# Patient Record
Sex: Female | Born: 1965 | Race: White | Hispanic: No | Smoking: Current every day smoker
Health system: Southern US, Community
[De-identification: ages and names within clinical notes are randomized; demographics above are authoritative.]

## PROBLEM LIST (undated history)

## (undated) DIAGNOSIS — Z87442 Personal history of urinary calculi: Secondary | ICD-10-CM

## (undated) DIAGNOSIS — R413 Other amnesia: Secondary | ICD-10-CM

## (undated) DIAGNOSIS — S069X9A Unspecified intracranial injury with loss of consciousness of unspecified duration, initial encounter: Secondary | ICD-10-CM

## (undated) DIAGNOSIS — G039 Meningitis, unspecified: Secondary | ICD-10-CM

## (undated) DIAGNOSIS — E876 Hypokalemia: Secondary | ICD-10-CM

## (undated) DIAGNOSIS — T4145XA Adverse effect of unspecified anesthetic, initial encounter: Secondary | ICD-10-CM

## (undated) DIAGNOSIS — R2689 Other abnormalities of gait and mobility: Secondary | ICD-10-CM

## (undated) DIAGNOSIS — N12 Tubulo-interstitial nephritis, not specified as acute or chronic: Secondary | ICD-10-CM

## (undated) DIAGNOSIS — N200 Calculus of kidney: Secondary | ICD-10-CM

## (undated) DIAGNOSIS — S2239XA Fracture of one rib, unspecified side, initial encounter for closed fracture: Secondary | ICD-10-CM

## (undated) DIAGNOSIS — N132 Hydronephrosis with renal and ureteral calculous obstruction: Secondary | ICD-10-CM

## (undated) DIAGNOSIS — J189 Pneumonia, unspecified organism: Secondary | ICD-10-CM

## (undated) DIAGNOSIS — T8859XA Other complications of anesthesia, initial encounter: Secondary | ICD-10-CM

## (undated) DIAGNOSIS — N3020 Other chronic cystitis without hematuria: Secondary | ICD-10-CM

## (undated) DIAGNOSIS — G8929 Other chronic pain: Secondary | ICD-10-CM

## (undated) DIAGNOSIS — R310 Gross hematuria: Secondary | ICD-10-CM

## (undated) DIAGNOSIS — N63 Unspecified lump in unspecified breast: Secondary | ICD-10-CM

## (undated) DIAGNOSIS — N23 Unspecified renal colic: Secondary | ICD-10-CM

## (undated) DIAGNOSIS — N201 Calculus of ureter: Secondary | ICD-10-CM

## (undated) DIAGNOSIS — N289 Disorder of kidney and ureter, unspecified: Secondary | ICD-10-CM

## (undated) HISTORY — DX: Unspecified renal colic: N23

## (undated) HISTORY — DX: Hydronephrosis with renal and ureteral calculous obstruction: N13.2

## (undated) HISTORY — DX: Unspecified lump in unspecified breast: N63.0

## (undated) HISTORY — PX: ABDOMINAL HYSTERECTOMY: SHX81

## (undated) HISTORY — PX: FACIAL FRACTURE SURGERY: SHX1570

## (undated) HISTORY — PX: CLAVICLE SURGERY: SHX598

## (undated) HISTORY — DX: Calculus of ureter: N20.1

## (undated) HISTORY — DX: Hypokalemia: E87.6

## (undated) HISTORY — DX: Other chronic cystitis without hematuria: N30.20

## (undated) HISTORY — PX: FEMUR FRACTURE SURGERY: SHX633

## (undated) HISTORY — DX: Tubulo-interstitial nephritis, not specified as acute or chronic: N12

## (undated) HISTORY — DX: Other chronic pain: G89.29

## (undated) HISTORY — DX: Gross hematuria: R31.0

---

## 1999-07-12 HISTORY — PX: FRACTURE SURGERY: SHX138

## 2010-03-16 ENCOUNTER — Emergency Department: Payer: Self-pay | Admitting: Emergency Medicine

## 2011-07-12 DIAGNOSIS — G039 Meningitis, unspecified: Secondary | ICD-10-CM

## 2011-07-12 HISTORY — DX: Meningitis, unspecified: G03.9

## 2012-01-11 ENCOUNTER — Emergency Department: Payer: Self-pay | Admitting: *Deleted

## 2012-01-11 LAB — URINALYSIS, COMPLETE
Glucose,UR: NEGATIVE mg/dL (ref 0–75)
Ketone: NEGATIVE
Nitrite: NEGATIVE
Ph: 5 (ref 4.5–8.0)
Protein: NEGATIVE
Specific Gravity: 1.005 (ref 1.003–1.030)

## 2012-01-11 LAB — CBC
HCT: 41.1 % (ref 35.0–47.0)
HGB: 14.1 g/dL (ref 12.0–16.0)
MCHC: 34.2 g/dL (ref 32.0–36.0)
Platelet: 245 10*3/uL (ref 150–440)
RBC: 4.23 10*6/uL (ref 3.80–5.20)
WBC: 6.1 10*3/uL (ref 3.6–11.0)

## 2012-01-11 LAB — COMPREHENSIVE METABOLIC PANEL
Albumin: 4.4 g/dL (ref 3.4–5.0)
Anion Gap: 9 (ref 7–16)
BUN: 10 mg/dL (ref 7–18)
Calcium, Total: 9.4 mg/dL (ref 8.5–10.1)
Chloride: 105 mmol/L (ref 98–107)
EGFR (African American): >60
Glucose: 143 mg/dL — ABNORMAL HIGH (ref 65–99)
Osmolality: 281 (ref 275–301)
Potassium: 3.7 mmol/L (ref 3.5–5.1)
SGOT(AST): 26 U/L (ref 15–37)
Total Protein: 7.8 g/dL (ref 6.4–8.2)

## 2012-01-11 LAB — LIPASE, BLOOD: Lipase: 115 U/L (ref 73–393)

## 2012-02-11 ENCOUNTER — Emergency Department: Payer: Self-pay | Admitting: Emergency Medicine

## 2012-02-11 LAB — BASIC METABOLIC PANEL
Anion Gap: 11 (ref 7–16)
Chloride: 105 mmol/L (ref 98–107)
Co2: 28 mmol/L (ref 21–32)
EGFR (African American): >60
EGFR (Non-African Amer.): >60
Glucose: 91 mg/dL (ref 65–99)
Sodium: 144 mmol/L (ref 136–145)

## 2012-02-11 LAB — CBC
HGB: 13.2 g/dL (ref 12.0–16.0)
MCHC: 35.6 g/dL (ref 32.0–36.0)
RDW: 13.2 % (ref 11.5–14.5)

## 2012-02-11 LAB — URINALYSIS, COMPLETE
Bacteria: NONE SEEN
Bilirubin,UR: NEGATIVE
Glucose,UR: NEGATIVE mg/dL (ref 0–75)
Ketone: NEGATIVE
Leukocyte Esterase: NEGATIVE
Ph: 6 (ref 4.5–8.0)
Protein: NEGATIVE
RBC,UR: 883 /HPF (ref 0–5)
Squamous Epithelial: 3

## 2012-03-08 DIAGNOSIS — R3915 Urgency of urination: Secondary | ICD-10-CM | POA: Insufficient documentation

## 2012-03-30 ENCOUNTER — Inpatient Hospital Stay: Payer: Self-pay | Admitting: Psychiatry

## 2012-03-30 LAB — URINALYSIS, COMPLETE
Bilirubin,UR: NEGATIVE
Blood: NEGATIVE
Ketone: NEGATIVE
Leukocyte Esterase: NEGATIVE
Nitrite: NEGATIVE
Ph: 6 (ref 4.5–8.0)
RBC,UR: 1 /HPF (ref 0–5)
Squamous Epithelial: NONE SEEN
WBC UR: <1 /HPF (ref 0–5)

## 2012-03-30 LAB — COMPREHENSIVE METABOLIC PANEL
Albumin: 4.3 g/dL (ref 3.4–5.0)
Alkaline Phosphatase: 134 U/L (ref 50–136)
BUN: 6 mg/dL — ABNORMAL LOW (ref 7–18)
Calcium, Total: 9.4 mg/dL (ref 8.5–10.1)
EGFR (Non-African Amer.): >60
Glucose: 110 mg/dL — ABNORMAL HIGH (ref 65–99)
Osmolality: 285 (ref 275–301)
Potassium: 3.6 mmol/L (ref 3.5–5.1)
SGPT (ALT): 86 U/L — ABNORMAL HIGH (ref 12–78)
Sodium: 144 mmol/L (ref 136–145)
Total Protein: 8.1 g/dL (ref 6.4–8.2)

## 2012-03-30 LAB — DRUG SCREEN, URINE
Amphetamines, Ur Screen: NEGATIVE (ref ?–1000)
Barbiturates, Ur Screen: NEGATIVE (ref ?–200)
Benzodiazepine, Ur Scrn: POSITIVE (ref ?–200)
Cannabinoid 50 Ng, Ur ~~LOC~~: NEGATIVE (ref ?–50)
Cocaine Metabolite,Ur ~~LOC~~: NEGATIVE (ref ?–300)
Methadone, Ur Screen: NEGATIVE (ref ?–300)
Opiate, Ur Screen: NEGATIVE (ref ?–300)
Phencyclidine (PCP) Ur S: NEGATIVE (ref ?–25)

## 2012-03-30 LAB — CBC
HCT: 44.3 % (ref 35.0–47.0)
MCH: 32.7 pg (ref 26.0–34.0)
MCHC: 33.6 g/dL (ref 32.0–36.0)
MCV: 97 fL (ref 80–100)
Platelet: 273 10*3/uL (ref 150–440)
RDW: 12.5 % (ref 11.5–14.5)
WBC: 13.2 10*3/uL — ABNORMAL HIGH (ref 3.6–11.0)

## 2012-03-30 LAB — ETHANOL: Ethanol %: 0.209 % — ABNORMAL HIGH (ref 0.000–0.080)

## 2012-03-30 LAB — TSH: Thyroid Stimulating Horm: 1.92 u[IU]/mL

## 2012-04-04 ENCOUNTER — Emergency Department: Payer: Self-pay | Admitting: Emergency Medicine

## 2012-04-08 ENCOUNTER — Emergency Department: Payer: Self-pay | Admitting: Emergency Medicine

## 2012-04-09 LAB — WOUND CULTURE

## 2012-05-12 ENCOUNTER — Emergency Department: Payer: Self-pay | Admitting: Emergency Medicine

## 2012-05-12 LAB — COMPREHENSIVE METABOLIC PANEL
Albumin: 4.9 g/dL (ref 3.4–5.0)
Alkaline Phosphatase: 99 U/L (ref 50–136)
Anion Gap: 10 (ref 7–16)
Bilirubin,Total: 0.5 mg/dL (ref 0.2–1.0)
Calcium, Total: 9.4 mg/dL (ref 8.5–10.1)
Co2: 25 mmol/L (ref 21–32)
Creatinine: 0.58 mg/dL — ABNORMAL LOW (ref 0.60–1.30)
Glucose: 91 mg/dL (ref 65–99)
Osmolality: 281 (ref 275–301)
Potassium: 3.7 mmol/L (ref 3.5–5.1)
Sodium: 142 mmol/L (ref 136–145)
Total Protein: 8.4 g/dL — ABNORMAL HIGH (ref 6.4–8.2)

## 2012-05-12 LAB — CBC
HGB: 15.5 g/dL (ref 12.0–16.0)
MCHC: 35.1 g/dL (ref 32.0–36.0)
MCV: 97 fL (ref 80–100)
Platelet: 256 10*3/uL (ref 150–440)
RDW: 12.7 % (ref 11.5–14.5)
WBC: 7.3 10*3/uL (ref 3.6–11.0)

## 2012-05-12 LAB — URINALYSIS, COMPLETE
Bacteria: NONE SEEN
Ph: 5 (ref 4.5–8.0)
Protein: 30
RBC,UR: 1093 /HPF (ref 0–5)
Specific Gravity: 1.03 (ref 1.003–1.030)
Squamous Epithelial: 27
WBC UR: 8 /HPF (ref 0–5)

## 2012-05-14 LAB — URINE CULTURE

## 2012-05-17 ENCOUNTER — Emergency Department: Payer: Self-pay | Admitting: Emergency Medicine

## 2012-05-17 LAB — COMPREHENSIVE METABOLIC PANEL
Alkaline Phosphatase: 83 U/L (ref 50–136)
Bilirubin,Total: 0.3 mg/dL (ref 0.2–1.0)
Calcium, Total: 9.2 mg/dL (ref 8.5–10.1)
Chloride: 104 mmol/L (ref 98–107)
Creatinine: 0.64 mg/dL (ref 0.60–1.30)
EGFR (African American): >60
Osmolality: 281 (ref 275–301)
Potassium: 3.8 mmol/L (ref 3.5–5.1)
SGOT(AST): 24 U/L (ref 15–37)
SGPT (ALT): 25 U/L (ref 12–78)
Sodium: 141 mmol/L (ref 136–145)
Total Protein: 7.8 g/dL (ref 6.4–8.2)

## 2012-05-17 LAB — URINALYSIS, COMPLETE
Bilirubin,UR: NEGATIVE
Ketone: NEGATIVE
Leukocyte Esterase: NEGATIVE
Nitrite: NEGATIVE
Ph: 8 (ref 4.5–8.0)
Protein: NEGATIVE
RBC,UR: 1095 /HPF (ref 0–5)
Squamous Epithelial: 13

## 2012-05-17 LAB — CBC
HCT: 42.9 % (ref 35.0–47.0)
MCV: 97 fL (ref 80–100)
RBC: 4.43 10*6/uL (ref 3.80–5.20)
WBC: 9.8 10*3/uL (ref 3.6–11.0)

## 2012-05-22 DIAGNOSIS — N63 Unspecified lump in unspecified breast: Secondary | ICD-10-CM

## 2012-05-22 HISTORY — DX: Unspecified lump in unspecified breast: N63.0

## 2012-05-31 DIAGNOSIS — G8929 Other chronic pain: Secondary | ICD-10-CM

## 2012-05-31 HISTORY — DX: Other chronic pain: G89.29

## 2012-07-02 ENCOUNTER — Emergency Department: Payer: Self-pay | Admitting: Emergency Medicine

## 2012-07-02 LAB — CBC WITH DIFFERENTIAL/PLATELET
Basophil #: 0.1 10*3/uL (ref 0.0–0.1)
Eosinophil #: 0.1 10*3/uL (ref 0.0–0.7)
Eosinophil %: 0.5 %
HCT: 41.1 % (ref 35.0–47.0)
Lymphocyte #: 2 10*3/uL (ref 1.0–3.6)
Lymphocyte %: 20.4 %
MCHC: 32.7 g/dL (ref 32.0–36.0)
Monocyte #: 0.4 x10 3/mm (ref 0.2–0.9)
Monocyte %: 3.8 %
Neutrophil #: 7.2 10*3/uL — ABNORMAL HIGH (ref 1.4–6.5)
Platelet: 224 10*3/uL (ref 150–440)
RDW: 12.9 % (ref 11.5–14.5)
WBC: 9.7 10*3/uL (ref 3.6–11.0)

## 2012-07-11 DIAGNOSIS — S069X9A Unspecified intracranial injury with loss of consciousness of unspecified duration, initial encounter: Secondary | ICD-10-CM

## 2012-07-11 DIAGNOSIS — S069XAA Unspecified intracranial injury with loss of consciousness status unknown, initial encounter: Secondary | ICD-10-CM

## 2012-07-11 DIAGNOSIS — J189 Pneumonia, unspecified organism: Secondary | ICD-10-CM

## 2012-07-11 HISTORY — DX: Unspecified intracranial injury with loss of consciousness status unknown, initial encounter: S06.9XAA

## 2012-07-11 HISTORY — DX: Pneumonia, unspecified organism: J18.9

## 2012-07-11 HISTORY — DX: Unspecified intracranial injury with loss of consciousness of unspecified duration, initial encounter: S06.9X9A

## 2012-10-17 DIAGNOSIS — R519 Headache, unspecified: Secondary | ICD-10-CM | POA: Insufficient documentation

## 2012-10-17 DIAGNOSIS — J439 Emphysema, unspecified: Secondary | ICD-10-CM | POA: Insufficient documentation

## 2012-12-18 ENCOUNTER — Emergency Department: Payer: Self-pay | Admitting: Emergency Medicine

## 2012-12-18 LAB — COMPREHENSIVE METABOLIC PANEL
Albumin: 4.1 g/dL (ref 3.4–5.0)
Anion Gap: 4 — ABNORMAL LOW (ref 7–16)
Calcium, Total: 9.2 mg/dL (ref 8.5–10.1)
Chloride: 105 mmol/L (ref 98–107)
Co2: 29 mmol/L (ref 21–32)
EGFR (African American): >60
EGFR (Non-African Amer.): >60
SGPT (ALT): 39 U/L (ref 12–78)
Total Protein: 7.5 g/dL (ref 6.4–8.2)

## 2012-12-18 LAB — URINALYSIS, COMPLETE
Bacteria: NONE SEEN
Bilirubin,UR: NEGATIVE
Ketone: NEGATIVE
Nitrite: NEGATIVE
Ph: 7 (ref 4.5–8.0)
RBC,UR: 264 /HPF (ref 0–5)
Specific Gravity: 1.008 (ref 1.003–1.030)
Squamous Epithelial: <1
WBC UR: 4 /HPF (ref 0–5)

## 2012-12-18 LAB — CBC
HCT: 43.1 % (ref 35.0–47.0)
MCH: 33.3 pg (ref 26.0–34.0)
RBC: 4.48 10*6/uL (ref 3.80–5.20)
RDW: 12.9 % (ref 11.5–14.5)

## 2012-12-18 LAB — PREGNANCY, URINE: Pregnancy Test, Urine: NEGATIVE m[IU]/mL

## 2013-02-09 ENCOUNTER — Inpatient Hospital Stay: Payer: Self-pay | Admitting: Internal Medicine

## 2013-02-09 LAB — COMPREHENSIVE METABOLIC PANEL
Anion Gap: 7 (ref 7–16)
BUN: 13 mg/dL (ref 7–18)
Bilirubin,Total: 0.6 mg/dL (ref 0.2–1.0)
Co2: 29 mmol/L (ref 21–32)
Creatinine: 0.67 mg/dL (ref 0.60–1.30)
EGFR (African American): >60
EGFR (Non-African Amer.): >60
Glucose: 112 mg/dL — ABNORMAL HIGH (ref 65–99)
Potassium: 3.2 mmol/L — ABNORMAL LOW (ref 3.5–5.1)
SGPT (ALT): 42 U/L (ref 12–78)
Sodium: 134 mmol/L — ABNORMAL LOW (ref 136–145)
Total Protein: 7.6 g/dL (ref 6.4–8.2)

## 2013-02-09 LAB — URINALYSIS, COMPLETE
Bilirubin,UR: NEGATIVE
Glucose,UR: NEGATIVE mg/dL (ref 0–75)
Hyaline Cast: 7
Nitrite: POSITIVE
Ph: 6 (ref 4.5–8.0)
RBC,UR: 336 /HPF (ref 0–5)
Renal Epithelial: 19
Specific Gravity: 1.016 (ref 1.003–1.030)

## 2013-02-09 LAB — CBC
HCT: 38.7 % (ref 35.0–47.0)
MCH: 33.6 pg (ref 26.0–34.0)
MCV: 95 fL (ref 80–100)
RBC: 4.07 10*6/uL (ref 3.80–5.20)
RDW: 13.1 % (ref 11.5–14.5)

## 2013-02-10 LAB — BASIC METABOLIC PANEL
Anion Gap: 7 (ref 7–16)
BUN: 9 mg/dL (ref 7–18)
Calcium, Total: 8.7 mg/dL (ref 8.5–10.1)
Chloride: 100 mmol/L (ref 98–107)
Co2: 26 mmol/L (ref 21–32)
Creatinine: 0.81 mg/dL (ref 0.60–1.30)
EGFR (Non-African Amer.): >60
Osmolality: 266 (ref 275–301)
Potassium: 3.2 mmol/L — ABNORMAL LOW (ref 3.5–5.1)
Sodium: 133 mmol/L — ABNORMAL LOW (ref 136–145)

## 2013-02-10 LAB — CBC WITH DIFFERENTIAL/PLATELET
Basophil #: 0 10*3/uL (ref 0.0–0.1)
Basophil %: 0.2 %
Eosinophil #: 0 10*3/uL (ref 0.0–0.7)
Eosinophil %: 0.2 %
Lymphocyte #: 0.9 10*3/uL — ABNORMAL LOW (ref 1.0–3.6)
Lymphocyte %: 6.4 %
MCV: 95 fL (ref 80–100)
Monocyte #: 1.4 x10 3/mm — ABNORMAL HIGH (ref 0.2–0.9)
Monocyte %: 9.3 %
Neutrophil %: 83.9 %
RDW: 13 % (ref 11.5–14.5)

## 2013-02-11 LAB — CBC WITH DIFFERENTIAL/PLATELET
Basophil %: 1.1 %
HGB: 10.9 g/dL — ABNORMAL LOW (ref 12.0–16.0)
Lymphocyte %: 7.1 %
MCH: 33.7 pg (ref 26.0–34.0)
MCHC: 35.5 g/dL (ref 32.0–36.0)
Monocyte #: 0.8 x10 3/mm (ref 0.2–0.9)
Monocyte %: 6.5 %
Neutrophil #: 10.7 10*3/uL — ABNORMAL HIGH (ref 1.4–6.5)
RBC: 3.24 10*6/uL — ABNORMAL LOW (ref 3.80–5.20)
RDW: 13 % (ref 11.5–14.5)

## 2013-02-11 LAB — BASIC METABOLIC PANEL
Anion Gap: 7 (ref 7–16)
BUN: 3 mg/dL — ABNORMAL LOW (ref 7–18)
Calcium, Total: 8.7 mg/dL (ref 8.5–10.1)
Co2: 27 mmol/L (ref 21–32)
Creatinine: 0.71 mg/dL (ref 0.60–1.30)
EGFR (Non-African Amer.): >60
Sodium: 135 mmol/L — ABNORMAL LOW (ref 136–145)

## 2013-02-11 LAB — URINE CULTURE

## 2013-02-16 ENCOUNTER — Emergency Department: Payer: Self-pay | Admitting: Emergency Medicine

## 2013-02-18 ENCOUNTER — Emergency Department: Payer: Self-pay | Admitting: Emergency Medicine

## 2013-02-20 ENCOUNTER — Emergency Department: Payer: Self-pay | Admitting: Emergency Medicine

## 2013-02-21 DIAGNOSIS — I8290 Acute embolism and thrombosis of unspecified vein: Secondary | ICD-10-CM

## 2013-02-21 HISTORY — DX: Acute embolism and thrombosis of unspecified vein: I82.90

## 2013-07-15 ENCOUNTER — Emergency Department: Payer: Self-pay | Admitting: Emergency Medicine

## 2013-07-15 LAB — COMPREHENSIVE METABOLIC PANEL
ALT: 65 U/L (ref 12–78)
ANION GAP: 2 — AB (ref 7–16)
Albumin: 4.4 g/dL (ref 3.4–5.0)
Alkaline Phosphatase: 108 U/L
BUN: 10 mg/dL (ref 7–18)
Bilirubin,Total: 0.3 mg/dL (ref 0.2–1.0)
CALCIUM: 9.6 mg/dL (ref 8.5–10.1)
CO2: 32 mmol/L (ref 21–32)
Chloride: 104 mmol/L (ref 98–107)
Creatinine: 0.59 mg/dL — ABNORMAL LOW (ref 0.60–1.30)
GLUCOSE: 93 mg/dL (ref 65–99)
OSMOLALITY: 274 (ref 275–301)
POTASSIUM: 3.6 mmol/L (ref 3.5–5.1)
SGOT(AST): 45 U/L — ABNORMAL HIGH (ref 15–37)
Sodium: 138 mmol/L (ref 136–145)
Total Protein: 8.3 g/dL — ABNORMAL HIGH (ref 6.4–8.2)

## 2013-07-15 LAB — CBC WITH DIFFERENTIAL/PLATELET
BASOS PCT: 0.8 %
Basophil #: 0 10*3/uL (ref 0.0–0.1)
EOS PCT: 1.9 %
Eosinophil #: 0.1 10*3/uL (ref 0.0–0.7)
HCT: 43.8 % (ref 35.0–47.0)
HGB: 14.8 g/dL (ref 12.0–16.0)
LYMPHS PCT: 38 %
Lymphocyte #: 1.6 10*3/uL (ref 1.0–3.6)
MCH: 31.9 pg (ref 26.0–34.0)
MCHC: 33.7 g/dL (ref 32.0–36.0)
MCV: 95 fL (ref 80–100)
Monocyte #: 0.4 x10 3/mm (ref 0.2–0.9)
Monocyte %: 8.7 %
NEUTROS ABS: 2.2 10*3/uL (ref 1.4–6.5)
NEUTROS PCT: 50.6 %
Platelet: 226 10*3/uL (ref 150–440)
RBC: 4.63 10*6/uL (ref 3.80–5.20)
RDW: 12.6 % (ref 11.5–14.5)
WBC: 4.3 10*3/uL (ref 3.6–11.0)

## 2013-07-15 LAB — URINALYSIS, COMPLETE
Bilirubin,UR: NEGATIVE
Glucose,UR: NEGATIVE mg/dL (ref 0–75)
Ketone: NEGATIVE
Leukocyte Esterase: NEGATIVE
NITRITE: NEGATIVE
PROTEIN: NEGATIVE
Ph: 6 (ref 4.5–8.0)
RBC,UR: 15 /HPF (ref 0–5)
Specific Gravity: 1.005 (ref 1.003–1.030)

## 2013-07-18 LAB — URINE CULTURE

## 2013-12-20 ENCOUNTER — Emergency Department: Payer: Self-pay | Admitting: Emergency Medicine

## 2013-12-20 LAB — COMPREHENSIVE METABOLIC PANEL
ALT: 49 U/L (ref 12–78)
ANION GAP: 6 — AB (ref 7–16)
AST: 51 U/L — AB (ref 15–37)
Albumin: 4 g/dL (ref 3.4–5.0)
Alkaline Phosphatase: 92 U/L
BUN: 5 mg/dL — ABNORMAL LOW (ref 7–18)
Bilirubin,Total: 0.5 mg/dL (ref 0.2–1.0)
CREATININE: 0.86 mg/dL (ref 0.60–1.30)
Calcium, Total: 8.8 mg/dL (ref 8.5–10.1)
Chloride: 103 mmol/L (ref 98–107)
Co2: 27 mmol/L (ref 21–32)
Glucose: 158 mg/dL — ABNORMAL HIGH (ref 65–99)
OSMOLALITY: 273 (ref 275–301)
POTASSIUM: 3.6 mmol/L (ref 3.5–5.1)
Sodium: 136 mmol/L (ref 136–145)
Total Protein: 7.7 g/dL (ref 6.4–8.2)

## 2013-12-20 LAB — CBC
HCT: 40.6 % (ref 35.0–47.0)
HGB: 14.1 g/dL (ref 12.0–16.0)
MCH: 33.6 pg (ref 26.0–34.0)
MCHC: 34.7 g/dL (ref 32.0–36.0)
MCV: 97 fL (ref 80–100)
PLATELETS: 212 10*3/uL (ref 150–440)
RBC: 4.2 10*6/uL (ref 3.80–5.20)
RDW: 13.3 % (ref 11.5–14.5)
WBC: 10.4 10*3/uL (ref 3.6–11.0)

## 2013-12-20 LAB — URINALYSIS, COMPLETE
BACTERIA: NONE SEEN
BILIRUBIN, UR: NEGATIVE
Glucose,UR: NEGATIVE mg/dL (ref 0–75)
Ketone: NEGATIVE
Leukocyte Esterase: NEGATIVE
Nitrite: NEGATIVE
PH: 7 (ref 4.5–8.0)
Protein: NEGATIVE
RBC,UR: 134 /HPF (ref 0–5)
Specific Gravity: 1.004 (ref 1.003–1.030)
Squamous Epithelial: 1
WBC UR: 12 /HPF (ref 0–5)

## 2014-07-11 HISTORY — PX: BREAST SURGERY: SHX581

## 2014-09-17 DIAGNOSIS — R310 Gross hematuria: Secondary | ICD-10-CM

## 2014-09-17 DIAGNOSIS — N23 Unspecified renal colic: Secondary | ICD-10-CM

## 2014-09-17 HISTORY — DX: Gross hematuria: R31.0

## 2014-09-17 HISTORY — DX: Unspecified renal colic: N23

## 2014-10-09 ENCOUNTER — Emergency Department
Admit: 2014-10-09 | Disposition: A | Discharge status: Home / Self Care | Payer: Self-pay | Admitting: Emergency Medicine

## 2014-10-09 LAB — CBC WITH DIFFERENTIAL/PLATELET
BASOS ABS: 0 10*3/uL (ref 0.0–0.1)
Basophil %: 0.9 %
EOS ABS: 0 10*3/uL (ref 0.0–0.7)
Eosinophil %: 0.9 %
HCT: 49.2 % — ABNORMAL HIGH (ref 35.0–47.0)
HGB: 16.2 g/dL — ABNORMAL HIGH (ref 12.0–16.0)
LYMPHS ABS: 1.6 10*3/uL (ref 1.0–3.6)
LYMPHS PCT: 31.9 %
MCH: 31.6 pg (ref 26.0–34.0)
MCHC: 32.9 g/dL (ref 32.0–36.0)
MCV: 96 fL (ref 80–100)
Monocyte #: 0.2 x10 3/mm (ref 0.2–0.9)
Monocyte %: 4.4 %
NEUTROS ABS: 3 10*3/uL (ref 1.4–6.5)
NEUTROS PCT: 61.9 %
PLATELETS: 281 10*3/uL (ref 150–440)
RBC: 5.13 10*6/uL (ref 3.80–5.20)
RDW: 12.3 % (ref 11.5–14.5)
WBC: 4.9 10*3/uL (ref 3.6–11.0)

## 2014-10-09 LAB — COMPREHENSIVE METABOLIC PANEL
ALK PHOS: 95 U/L
ANION GAP: 8 (ref 7–16)
Albumin: 5.2 g/dL — ABNORMAL HIGH
BILIRUBIN TOTAL: 0.7 mg/dL
BUN: 9 mg/dL
CREATININE: 0.74 mg/dL
Calcium, Total: 10 mg/dL
Chloride: 102 mmol/L
Co2: 28 mmol/L
EGFR (African American): >60
Glucose: 151 mg/dL — ABNORMAL HIGH
Potassium: 3.4 mmol/L — ABNORMAL LOW
SGOT(AST): 27 U/L
SGPT (ALT): 21 U/L
Sodium: 138 mmol/L
Total Protein: 9 g/dL — ABNORMAL HIGH

## 2014-10-09 LAB — TROPONIN I: Troponin-I: <0.03 ng/mL

## 2014-10-09 LAB — LIPASE, BLOOD: Lipase: 37 U/L

## 2014-10-15 DIAGNOSIS — R3989 Other symptoms and signs involving the genitourinary system: Secondary | ICD-10-CM | POA: Insufficient documentation

## 2014-10-15 DIAGNOSIS — N3020 Other chronic cystitis without hematuria: Secondary | ICD-10-CM

## 2014-10-15 HISTORY — DX: Other chronic cystitis without hematuria: N30.20

## 2014-10-28 NOTE — Consult Note (Signed)
Brief Consult Note: Diagnosis: depression nos.   Patient was seen by consultant.   Recommend further assessment or treatment.   Orders entered.   Comments: Psychiatry: Patient seen. [facility]. See note.  Electronic Signatures: Clapacs, Jackquline DenmarkJohn T (MD)  (Signed 20-Sep-13 18:24)  Authored: Brief Consult Note   Last Updated: 20-Sep-13 18:24 by Audery Amellapacs, John T (MD)
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
## 2014-10-31 NOTE — Discharge Summary (Signed)
PATIENT NAME:  Teresa Davenport, Teresa Davenport MR#:  [id number] DATE OF BIRTH:  11-Jul-1966  DATE OF ADMISSION:  02/09/2013 DATE OF DISCHARGE:  02/13/2013  PRIMARY CARE PHYSICIAN: None. The [facility] for multiple specialties of oncology and urology.   DISCHARGE DIAGNOSES: 1.  Escherichia coli bacteremia urinary tract infection.  2.  Acute pyelonephritis.  3.  Right-sided pelvocaliectasis.  4.  Chronic pain syndrome.  5.  Narcotic abuse.   IMAGING STUDIES: Included an ultrasound of the kidneys showed right pelvocaliectasis with no clear hydronephrosis or abscess or obstruction.   CONSULTANTS: Dr. Evelene CroonWolff of urology.   ADMITTING HISTORY AND PHYSICAL: Please see detailed H and P dictated previously. In brief, a 49 year old female patient with prior history of renal stones, stents, presented to the hospital complaining of acute onset of flank pain, abdominal pain and was found to have a urinary tract infection with pyelonephritis, sepsis, admitted to the hospitalist service. The patient did have urine and blood cultures positive for Escherichia coli which were pan sensitive. She was on ciprofloxacin prior to sensitivity being available, secondary to episodes of fever. The patient was started on open meropenem,  but developed an allergic reaction with rash, which was stopped. The patient is doing well on Cipro has not had fevers for over 36 hours. She is being changed to oral Levaquin which comes a liquid form, which has been requested by the patient. The patient will be on antibiotics for 11 more days, which will finish 14 days from her last episode of fever.   Today, the patient is afebrile, still complains of abdominal pain, having some abdominal tenderness. Surprisingly, the patient's abdominal tenderness is in excess of exam.    DISCHARGE MEDICATIONS:  1.  Levaquin 500 milligrams oral once a day for 11 days.  2.  Tylenol 500 mg oral every six hours as needed for fever.  3.  Acetaminophen/oxycodone  325/5, one tablet oral every six hours as needed for pain.   DISCHARGE INSTRUCTIONS: Regular diet, activity as tolerated. Follow up with primary care physician and [facility] in 1 to 2 weeks. The patient was seen by Dr. Evelene CroonWolff of urology in the hospital for  pelvocaliectasis and suggested further work-up.   TIME SPENT: On day of discharge in discharge activity was 45 minutes.   ____________________________ Molinda BailiffSrikar R. Nesha Counihan, MD srs:cc D: 02/13/2013 14:00:03 ET T: 02/13/2013 20:43:07 ET JOB#: [id number]  cc: Wardell HeathSrikar R. Elpidio AnisSudini, MD, <Dictator> Orie FishermanSRIKAR R Esgar Barnick MD ELECTRONICALLY SIGNED 02/25/2013 8:39

## 2014-10-31 NOTE — Consult Note (Signed)
Brief Consult Note: Diagnosis: Acute R pyelonephritis. Mild R pelviocaliectasis. Bilateral renal cysts.   Patient was seen by consultant.   Consult note dictated.   Recommend further assessment or treatment.   Orders entered.   Discussed with Attending MD.   Comments: Treat UTI. Repeat renal ultrasound in one month. Initiate UTI prevention measures after discharge ( increase fluid intake, proper perineal hygiene, estrogen vaginal cream, cranberry supplements.) [place].  Electronic Signatures: Orson ApeWolff, Aranza Geddes R (MD)  (Signed 05-Aug-14 13:00)  Authored: Brief Consult Note   Last Updated: 05-Aug-14 13:00 by Orson ApeWolff, Yesica Kemler R (MD)

## 2014-10-31 NOTE — H&P (Signed)
PATIENT NAME:  Teresa Davenport, Teresa Davenport MR#:  [id number] DATE OF BIRTH:  1965-11-05  DATE OF ADMISSION:  02/09/2013  PRIMARY CARE PHYSICIAN:  Does not have one.   CHIEF COMPLAINT:  Abdominal pain, nausea and fever.   HISTORY OF PRESENT ILLNESS:  This is a 49 year old female who presents with a 3-day history of bandlike abdominal pain in the midabdomen, associated with fever as high as 104, with some nausea and vomiting. The patient [place]. Did not seek any help, as she thought she probably needed to be admitted to the hospital, and she would rather come home and be admitted locally than being out of town. She presents today, as her symptoms were not improving, She also complains of frequency of urination, but no burning with urination. She denies any chest pain, shortness of breath, or any other associated symptoms presently. The patient presented to the hospital, was noted to have a fever of 101, also noted to have abnormal urinalysis. Clinical diagnosis of suspected acute pyelonephritis was made, and hospitalist services were contacted for further treatment and evaluation.   REVIEW OF SYSTEMS:   CONSTITUTIONAL:  Positive documented fever. No weight gain. No weight loss.  EYES:  No blurry or double vision.  EARS, NOSE, THROAT:  No tinnitus. No postnasal drip. No redness of the oropharynx.  RESPIRATORY:  No cough, no wheeze, no hemoptysis, no dyspnea.  CARDIOVASCULAR:  No chest pain. No orthopnea. No palpitations. No syncope.  GASTROINTESTINAL: Positive nausea. Positive vomiting. Positive abdominal pain. No melena or hematochezia.  GENITOURINARY:  Positive frequency, but no dysuria. No hematuria.  ENDOCRINE:  No polyuria or nocturia. No heat or cold intolerance.  HEMATOLOGIC:  No anemia, no bruising, no bleeding.  INTEGUMENT:  No rashes or lesions.  MUSCULOSKELETAL:  No arthritis, no swelling, no gout.  NEUROLOGIC:  No numbness or tingling. No ataxia.  No seizure-type activity.  PSYCHIATRIC:  No anxiety, no insomnia, no ADD.   PAST MEDICAL HISTORY:  Consistent with depression, history of kidney stones.   ALLERGIES: ALEVE, AMOXICILLIN, DOXYCYCLINE, IBUPROFEN, KETOROLAC, NSAIDs, PENICILLIN, ROBINUL, TRAMADOL, TRAZODONE. Most of them which cause hives.   SOCIAL HISTORY:  Does smoke about 5 cigarettes per day, has been smoking for the past 15-20 years. No alcohol abuse. No illicit drug abuse. Lives at home with her boyfriend.   FAMILY HISTORY: The patient's father died from pancreatic cancer. Mother has kidney disease.   CURRENT MEDICATIONS:  Tylenol as needed for the fever and pain.   PHYSICAL EXAMINATION:   VITAL SIGNS ARE NOTED TO BE:  Temperature is 101.8, pulse 106, respirations 20, blood pressure 123/70, sats 97% on room air.  GENERAL:  The patient is a pleasant-appearing female, but in no apparent distress. HEAD, EYES, EARS, NOSE, THROAT EXAM: The patient is atraumatic, normocephalic. Extraocular muscles are intact. Pupils equal and reactive to light. Sclerae anicteric. No conjunctival injection. No pharyngeal erythema.  NECK: Supple. There is no jugular venous distention, no bruits, no lymphadenopathy, no thyromegaly.  HEART EXAM:  Regular rate and rhythm. Tachycardic. No murmurs, no rubs, no clicks.  LUNGS:  Clear to auscultation bilaterally. No rales or rhonchi. No wheezes.  ABDOMEN:  Soft, flat. Tender diffusely. No rebound. No rigidity. Positive voluntary guarding. Hypoactive bowel sounds. No hepatosplenomegaly appreciated.  EXTREMITIES:  No evidence of any cyanosis, clubbing or peripheral edema. Has +2 pedal and radial pulses bilaterally.  NEUROLOGIC: The patient is alert, awake, oriented x 3 with no focal  motor or sensory deficits appreciated bilaterally.  SKIN:  Moist and warm, with no rashes appreciated.  LYMPHATIC: There is no cervical or axillary lymphadenopathy.  LABORATORY EXAM:  Showed a serum glucose of 112, BUN 13,  creatinine 0.6, sodium 134, potassium 3.2, chloride 98, bicarb 29. LFTs are within normal limits. White cell count 14.3, hemoglobin 13.7, hematocrit 38.7, platelet count 162. Urinalysis shows 3+ leukocyte esterase with 900 white cells, with 2+ bacteria. The patient also had an ultrasound of the kidneys done, which showed mild pelvocaliectasis on the right kidney, small cyst in the upper pole of the left kidney, but no further acute abnormalities.   ASSESSMENT AND PLAN:  This is a 49 year old female with a history of depression, history of nephrolithiasis, presents to the hospital with fever, bilateral flank, abdominal pain. Noted to have acute pyelonephritis.   1. Acute pyelonephritis. This is likely the cause of patient's fever, nausea, vomiting and abdominal pain. I will start the patient on aggressive IV fluid hydration, place her on IV ciprofloxacin, follow urine cultures. I will give her some p.r.n. Dilaudid for pain control. Follow her clinically. The patient does seem to have some drug-seeking behavior for narcotics. Therefore, will need to watch, given her high dose narcotics. I will also start some Pyridium for some dysuria and bladder spasms for now.   2.  Leukocytosis. This is likely secondary to the acute pyelo. I will follow white cell count after IV antibiotic therapy.  3. Systemic inflammatory response syndrome. This is likely secondary to the acute pyelonephritis. The patient presented with fever, tachycardia and leukocytosis. I will hydrate the patient with IV fluids, give her IV ciprofloxacin for the UTI. Follow hemodynamics, follow fever curve.   4. Hypokalemia. I will go ahead and replace her potassium accordingly, and repeat it in the morning.   5.  The patient is a FULL CODE.   Time spent on admission is 50 minutes.     ____________________________ Rolly Pancake. Cherlynn Kaiser, MD vjs:mr D: 02/09/2013 18:42:37 ET T: 02/09/2013 19:03:31 ET JOB#: [id number]  cc: Rolly Pancake. Cherlynn Kaiser, MD,  <Dictator> Houston Siren MD ELECTRONICALLY SIGNED 02/11/2013 14:45

## 2014-10-31 NOTE — Consult Note (Signed)
PATIENT NAME:  Teresa Davenport, [name] MR#:  [id number] DATE OF BIRTH:  1965/11/09  DATE OF CONSULTATION:  02/12/2013  REFERRING PHYSICIAN:   Srikar R. Sudini, MD CONSULTING PHYSICIAN:  Suszanne ConnersMichael R. Evelene CroonWolff, MD  REASON FOR CONSULTATION: Pyelonephritis and mild pelvocaliectasis.   HISTORY OF PRESENT ILLNESS: Mrs. Joycelyn ManZimmerman is a 49 year old Caucasian female who presented to the hospital with acute abdominal pain associated with fever, nausea and vomiting. Blood cultures grew out E. coli. She had a renal ultrasound performed, which indicated bilateral renal cysts and mild right pelvocaliectasis. The patient specifically denies flank pain but has generalized low back pain. She does have a long history of recurrent kidney stone disease and underwent percutaneous nephrolithotomy on the left side back in 2005, and has had multiple stent placements for stones in the past. [place] in [place], and [place] other [place] in [place], where [place] has a urologist that normally follows her.   PAST MEDICAL HISTORY: THE PATIENT IS ALLERGIC TO ALEVE, AMOXICILLIN, DOXYCYCLINE, IBUPROFEN, TORADOL, NSAIDS, PENICILLIN, ROBINUL, TRAMADOL AND TRAZODONE.   CHRONIC MEDICATIONS: Include Tylenol p.r.n.   PAST SURGICAL HISTORY:  1.  Right percutaneous nephrolithotomy in 2005.  2.  Multiple stent placements for stone disease in the past 10 years.   3.  Total abdominal hysterectomy in 1980.   SOCIAL HISTORY: The patient smokes a half pack a day and has greater than 20 pack-year history. She denied alcohol use.   FAMILY HISTORY: Remarkable for mother with chronic renal disease.   PAST AND CURRENT MEDICAL CONDITIONS:  1.  Recurrent kidney stones.  2.  Recurrent urinary tract infections.   REVIEW OF SYSTEMS:  The patient denied gross hematuria or urinary incontinence.   PHYSICAL EXAMINATION:   ABDOMEN: Abdomen was soft. No CVA tenderness.   RADIOLOGIC DATA:  Renal ultrasound report  dated August 02 was reviewed.   PERTINENT LABORATORY STUDIES: Include a white cell count of 12,700 with hematocrit of 30.8%. Creatinine was 0.71.   Urine culture was positive for E. coli, and blood culture was positive for E. coli.   IMPRESSION: 1.  Febrile urinary tract infection, probable right pyelonephritis.  2.  Past history of kidney stone disease.  3.  Bilateral renal cysts.  4.  Mild right pelvocaliectasis.   SUGGESTIONS: 1.  Treat the UTI as you are doing.  2.  Surgical intervention is not indicated at this time.  3.  Repeat renal ultrasound in 1 month.  4.  Discussed UTI prevention measures with the patient, including increasing fluid intake, proper perineal hygiene, possible topical vaginal estrogen cream and cranberry supplements. Apparently, the patient has discussed these issues with [place] in [place], and I suggested she follow up with him after discharge.    ____________________________ Suszanne ConnersMichael R. Evelene CroonWolff, MD mrw:dmm D: 02/12/2013 13:06:55 ET T: 02/12/2013 13:28:15 ET JOB#: [id number]  cc: Suszanne ConnersMichael R. Evelene CroonWolff, MD, <Dictator> Orson ApeMICHAEL R Ellamay Fors MD ELECTRONICALLY SIGNED 02/12/2013 20:14

## 2015-07-05 ENCOUNTER — Emergency Department
Admission: EM | Admit: 2015-07-05 | Discharge: 2015-07-05 | Disposition: A | Discharge status: Home / Self Care | Payer: Self-pay | Attending: Emergency Medicine | Admitting: Emergency Medicine

## 2015-07-05 ENCOUNTER — Emergency Department: Payer: Self-pay

## 2015-07-05 ENCOUNTER — Encounter: Payer: Self-pay | Admitting: Emergency Medicine

## 2015-07-05 DIAGNOSIS — Y998 Other external cause status: Secondary | ICD-10-CM | POA: Insufficient documentation

## 2015-07-05 DIAGNOSIS — Y9389 Activity, other specified: Secondary | ICD-10-CM | POA: Insufficient documentation

## 2015-07-05 DIAGNOSIS — Z880 Allergy status to penicillin: Secondary | ICD-10-CM | POA: Insufficient documentation

## 2015-07-05 DIAGNOSIS — Y92009 Unspecified place in unspecified non-institutional (private) residence as the place of occurrence of the external cause: Secondary | ICD-10-CM | POA: Insufficient documentation

## 2015-07-05 DIAGNOSIS — W500XXA Accidental hit or strike by another person, initial encounter: Secondary | ICD-10-CM | POA: Insufficient documentation

## 2015-07-05 DIAGNOSIS — F17210 Nicotine dependence, cigarettes, uncomplicated: Secondary | ICD-10-CM | POA: Insufficient documentation

## 2015-07-05 DIAGNOSIS — S20212A Contusion of left front wall of thorax, initial encounter: Secondary | ICD-10-CM | POA: Insufficient documentation

## 2015-07-05 MED ORDER — MORPHINE SULFATE (PF) 2 MG/ML IV SOLN
2.0000 mg | Freq: Once | INTRAVENOUS | Status: AC
Start: 2015-07-05 — End: 2015-07-05
  Administered 2015-07-05: 2 mg via INTRAVENOUS

## 2015-07-05 MED ORDER — ONDANSETRON HCL 4 MG/2ML IJ SOLN
INTRAMUSCULAR | Status: AC
  Administered 2015-07-05: 4 mg via INTRAVENOUS
  Filled 2015-07-05: qty 2

## 2015-07-05 MED ORDER — ONDANSETRON HCL 4 MG/2ML IJ SOLN
4.0000 mg | Freq: Once | INTRAMUSCULAR | Status: AC
  Administered 2015-07-05: 4 mg via INTRAVENOUS

## 2015-07-05 MED ORDER — MORPHINE SULFATE (PF) 4 MG/ML IV SOLN
INTRAVENOUS | Status: AC
  Administered 2015-07-05: 4 mg via INTRAVENOUS
  Filled 2015-07-05: qty 1

## 2015-07-05 MED ORDER — OXYCODONE-ACETAMINOPHEN 5-325 MG PO TABS
1.0000 | ORAL_TABLET | Freq: Once | ORAL | Status: AC
  Administered 2015-07-05: 1 via ORAL
  Filled 2015-07-05: qty 1

## 2015-07-05 MED ORDER — MORPHINE SULFATE (PF) 4 MG/ML IV SOLN
4.0000 mg | Freq: Once | INTRAVENOUS | Status: AC
  Administered 2015-07-05: 4 mg via INTRAVENOUS

## 2015-07-05 MED ORDER — MORPHINE SULFATE (PF) 4 MG/ML IV SOLN
4.0000 mg | Freq: Once | INTRAVENOUS | Status: AC
  Administered 2015-07-05: 4 mg via INTRAVENOUS
  Filled 2015-07-05: qty 1

## 2015-07-05 MED ORDER — MORPHINE SULFATE (PF) 2 MG/ML IV SOLN
INTRAVENOUS | Status: AC
  Administered 2015-07-05: 2 mg via INTRAVENOUS
  Filled 2015-07-05: qty 1

## 2015-07-05 MED ORDER — OXYCODONE-ACETAMINOPHEN 5-325 MG PO TABS: 1.0000 | ORAL_TABLET | ORAL | Status: DC | PRN
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
## 2015-07-05 NOTE — ED Provider Notes (Signed)
[facility] Emergency Department Provider Note    ____________________________________________  Time seen: On EMS arrival  I have reviewed the triage vital signs and the nursing notes.   HISTORY  Chief Complaint Fall   History limited by: Not Limited   HPI Teresa Davenport is a 49 y.o. female who presents to the emergency department today because of left chest pain. The patient states she was roughhousing with her brother when he fell on top of her. She felt a pop into her left chest. She states the pain is worse right under her left breast however she states the pain feels like it radiates up and down her left side. She feels it hurts worse when taking a deep breath. She denies any other injuries. No head injury. No neck pain.   History reviewed. No pertinent past medical history.  There are no active problems to display for this patient.   Past Surgical History  Procedure Laterality Date  . Abdominal hysterectomy    . Breast surgery      Current Outpatient Rx  Name  Route  Sig  Dispense  Refill  . oxyCODONE-acetaminophen (ROXICET) 5-325 MG tablet   Oral   Take 1 tablet by mouth every 4 (four) hours as needed for severe pain.   20 tablet   0     Allergies Penicillins  No family history on file.  Social History Social History  Substance Use Topics  . Smoking status: Light Tobacco Smoker    Types: Cigarettes  . Smokeless tobacco: None  . Alcohol Use: Yes    Review of Systems  Constitutional: Negative for fever. Cardiovascular: Positive for left-sided chest pain. Respiratory: Negative for shortness of breath. Gastrointestinal: Negative for abdominal pain, vomiting and diarrhea. Neurological: Negative for headaches, focal weakness or numbness.   10-point ROS otherwise negative.  ____________________________________________   PHYSICAL EXAM:  VITAL SIGNS: ED Triage Vitals  Enc Vitals Group     BP 07/05/15 2117 126/84  mmHg     Pulse Rate 07/05/15 2117 108     Resp 07/05/15 2117 20     Temp 07/05/15 2117 97.9 F (36.6 C)     Temp Source 07/05/15 2117 Oral     SpO2 07/05/15 2113 98 %     Weight --      Height --      Head Cir --      Peak Flow --      Pain Score 07/05/15 2118 10   Constitutional: Alert and oriented. Patient appears uncomfortable. Eyes: Conjunctivae are normal. PERRL. Normal extraocular movements. ENT   Head: Normocephalic and atraumatic.   Nose: No congestion/rhinnorhea.   Mouth/Throat: Mucous membranes are moist.   Neck: No stridor. Hematological/Lymphatic/Immunilogical: No cervical lymphadenopathy. Cardiovascular: Normal rate, regular rhythm.  No murmurs, rubs, or gallops. Respiratory: Normal respiratory effort without tachypnea nor retractions. Breath sounds are clear and equal bilaterally. No wheezes/rales/rhonchi. Patient does have tenderness to palpation just under the left breast. No skin break. Gastrointestinal: Soft and nontender. No rebound. No guarding. No distention.Genitourinary: Deferred Musculoskeletal: Normal range of motion in all extremities. No joint effusions.  No lower extremity tenderness nor edema. Neurologic:  Normal speech and language. No gross focal neurologic deficits are appreciated.  Skin:  Skin is warm, dry and intact. No rash noted. Psychiatric: Mood and affect are normal. Speech and behavior are normal. Patient exhibits appropriate insight and judgment.  ____________________________________________    LABS (pertinent positives/negatives)  None  ____________________________________________   EKG  None  ____________________________________________    RADIOLOGY  CXR IMPRESSION: No active cardiopulmonary disease.   ____________________________________________   PROCEDURES  Procedure(s) performed: None  Critical Care performed: No  ____________________________________________   INITIAL IMPRESSION / ASSESSMENT  AND PLAN / ED COURSE  Pertinent labs & imaging results that were available during my care of the patient were reviewed by me and considered in my medical decision making (see chart for details).  Patient presented to the emergency department today with left chest pain after traumatic injury. On exam patient is tender to palpation primarily below the left chest. No abdominal or left upper quadrant tenderness. This time chest x-ray does not show any acute fracture however feel he patient either suffered a small rib fracture or severe recurrent contusion. I doubt splenic laceration at this point given that the pain is not the lower ribs. Will discharge home with analgesics and incentive spirometer. Discussed return precautions with the patient and family.  ____________________________________________   FINAL CLINICAL IMPRESSION(S) / ED DIAGNOSES  Final diagnoses:  Rib contusion, left, initial encounter     Phineas Semen, MD 07/05/15 2326

## 2015-07-05 NOTE — Discharge Instructions (Signed)
Please seek medical attention for any high fevers, chest pain, shortness of breath, change in behavior, persistent vomiting, bloody stool or any other new or concerning symptoms. ° ° °Rib Contusion °A rib contusion is a deep bruise on your rib area. Contusions are the result of a blunt trauma that causes bleeding and injury to the tissues under the skin. A rib contusion may involve bruising of the ribs and of the skin and muscles in the area. The skin overlying the contusion may turn blue, purple, or yellow. Minor injuries will give you a painless contusion, but more severe contusions may stay painful and swollen for a few weeks. °CAUSES  °A contusion is usually caused by a blow, trauma, or direct force to an area of the body. This often occurs while playing contact sports. °SYMPTOMS °· Swelling and redness of the injured area. °· Discoloration of the injured area. °· Tenderness and soreness of the injured area. °· Pain with or without movement. °DIAGNOSIS  °The diagnosis can be made by taking a medical history and performing a physical exam. An X-ray, CT scan, or MRI may be needed to determine if there were any associated injuries, such as broken bones (fractures) or internal injuries. °TREATMENT  °Often, the best treatment for a rib contusion is rest. Icing or applying cold compresses to the injured area may help reduce swelling and inflammation. Deep breathing exercises may be recommended to reduce the risk of partial lung collapse and pneumonia. Over-the-counter or prescription medicines may also be recommended for pain control. °HOME CARE INSTRUCTIONS  °· Apply ice to the injured area: °¨ Put ice in a plastic bag. °¨ Place a towel between your skin and the bag. °¨ Leave the ice on for 20 minutes, 2-3 times per day. °· Take medicines only as directed by your health care provider. °· Rest the injured area. Avoid strenuous activity and any activities or movements that cause pain. Be careful during activities and  avoid bumping the injured area. °· Perform deep-breathing exercises as directed by your health care provider. °· Do not lift anything that is heavier than 5 lb (2.3 kg) until your health care provider approves. °· Do not use any tobacco products, including cigarettes, chewing tobacco, or electronic cigarettes. If you need help quitting, ask your health care provider. °SEEK MEDICAL CARE IF:  °· You have increased bruising or swelling. °· You have pain that is not controlled with treatment. °· You have a fever. °SEEK IMMEDIATE MEDICAL CARE IF:  °· You have difficulty breathing or shortness of breath. °· You develop a continual cough, or you cough up thick or bloody sputum. °· You feel sick to your stomach (nauseous), you throw up (vomit), or you have abdominal pain. °  °This information is not intended to replace advice given to you by your health care provider. Make sure you discuss any questions you have with your health care provider. °  °Document Released: 03/22/2001 Document Revised: 07/18/2014 Document Reviewed: 04/08/2014 °Elsevier Interactive Patient Education ©2016 [employer] ° °

## 2015-08-09 DIAGNOSIS — Z79899 Other long term (current) drug therapy: Secondary | ICD-10-CM | POA: Insufficient documentation

## 2015-08-09 DIAGNOSIS — F17210 Nicotine dependence, cigarettes, uncomplicated: Secondary | ICD-10-CM | POA: Insufficient documentation

## 2015-08-09 DIAGNOSIS — Z880 Allergy status to penicillin: Secondary | ICD-10-CM | POA: Insufficient documentation

## 2015-08-09 DIAGNOSIS — X58XXXD Exposure to other specified factors, subsequent encounter: Secondary | ICD-10-CM | POA: Insufficient documentation

## 2015-08-09 DIAGNOSIS — S2242XD Multiple fractures of ribs, left side, subsequent encounter for fracture with routine healing: Secondary | ICD-10-CM | POA: Insufficient documentation

## 2015-08-09 DIAGNOSIS — R1110 Vomiting, unspecified: Secondary | ICD-10-CM | POA: Insufficient documentation

## 2015-08-10 ENCOUNTER — Emergency Department: Payer: Self-pay

## 2015-08-10 ENCOUNTER — Encounter: Payer: Self-pay | Admitting: Emergency Medicine

## 2015-08-10 ENCOUNTER — Emergency Department
Admission: EM | Admit: 2015-08-10 | Discharge: 2015-08-10 | Disposition: A | Discharge status: Home / Self Care | Payer: Self-pay | Attending: Emergency Medicine | Admitting: Emergency Medicine

## 2015-08-10 DIAGNOSIS — S2232XD Fracture of one rib, left side, subsequent encounter for fracture with routine healing: Secondary | ICD-10-CM

## 2015-08-10 DIAGNOSIS — R0789 Other chest pain: Secondary | ICD-10-CM

## 2015-08-10 HISTORY — DX: Disorder of kidney and ureter, unspecified: N28.9

## 2015-08-10 HISTORY — DX: Fracture of one rib, unspecified side, initial encounter for closed fracture: S22.39XA

## 2015-08-10 MED ORDER — MORPHINE SULFATE (PF) 4 MG/ML IV SOLN
4.0000 mg | Freq: Once | INTRAVENOUS | Status: AC
  Administered 2015-08-10: 4 mg via INTRAMUSCULAR
  Filled 2015-08-10: qty 1

## 2015-08-10 MED ORDER — DIAZEPAM 2 MG PO TABS
2.0000 mg | ORAL_TABLET | Freq: Once | ORAL | Status: AC
  Administered 2015-08-10: 2 mg via ORAL
  Filled 2015-08-10: qty 1

## 2015-08-10 MED ORDER — OXYCODONE-ACETAMINOPHEN 5-325 MG PO TABS
1.0000 | ORAL_TABLET | Freq: Once | ORAL | Status: AC
  Administered 2015-08-10: 1 via ORAL
  Filled 2015-08-10: qty 1

## 2015-08-10 MED ORDER — LIDOCAINE 5 % EX PTCH
1.0000 | MEDICATED_PATCH | CUTANEOUS | Status: DC
  Administered 2015-08-10: 1 via TRANSDERMAL
  Filled 2015-08-10 (×2): qty 1

## 2015-08-10 MED ORDER — OXYCODONE-ACETAMINOPHEN 5-325 MG PO TABS
1.0000 | ORAL_TABLET | Freq: Once | ORAL | Status: AC
Start: 2015-08-10 — End: 2015-08-10
  Administered 2015-08-10: 1 via ORAL

## 2015-08-10 MED ORDER — LIDOCAINE 5 % EX PTCH: 1.0000 | MEDICATED_PATCH | Freq: Two times a day (BID) | CUTANEOUS | Status: DC

## 2015-08-10 MED ORDER — OXYCODONE-ACETAMINOPHEN 5-325 MG PO TABS
1.0000 | ORAL_TABLET | Freq: Four times a day (QID) | ORAL | Status: DC | PRN
Start: 2015-08-10 — End: 2016-04-18

## 2015-08-10 MED ORDER — OXYCODONE-ACETAMINOPHEN 5-325 MG PO TABS
ORAL_TABLET | ORAL | Status: AC
  Filled 2015-08-10: qty 1

## 2015-08-10 NOTE — ED Notes (Signed)
MD at bedside. 

## 2015-08-10 NOTE — ED Notes (Signed)
Reviewed d/c instructions, prescriptions, follow-up care, and use of ice with patient. Pt verbalized understanding.

## 2015-08-10 NOTE — ED Notes (Signed)
Pt says she sustained 2 fractured ribs on 07/05/15 while rough housing with her brother; says yesterday she was vomiting; woke this am with pain under her left breast where the fractured ribs are located; pain radiates through to her back; pt took her last Vicodin around 1pm Sunday;

## 2015-08-10 NOTE — Discharge Instructions (Signed)
Chest Wall Pain °Chest wall pain is pain in or around the bones and muscles of your chest. Sometimes, an injury causes this pain. Sometimes, the cause may not be known. This pain may take several weeks or longer to get better. °HOME CARE INSTRUCTIONS  °Pay attention to any changes in your symptoms. Take these actions to help with your pain:  °· Rest as told by your health care provider.   °· Avoid activities that cause pain. These include any activities that use your chest muscles or your abdominal and side muscles to lift heavy items.    °· If directed, apply ice to the painful area: °· Put ice in a plastic bag. °· Place a towel between your skin and the bag. °· Leave the ice on for 20 minutes, 2-3 times per day. °· Take over-the-counter and prescription medicines only as told by your health care provider. °· Do not use tobacco products, including cigarettes, chewing tobacco, and e-cigarettes. If you need help quitting, ask your health care provider. °· Keep all follow-up visits as told by your health care provider. This is important. °SEEK MEDICAL CARE IF: °· You have a fever. °· Your chest pain becomes worse. °· You have new symptoms. °SEEK IMMEDIATE MEDICAL CARE IF: °· You have nausea or vomiting. °· You feel sweaty or light-headed. °· You have a cough with phlegm (sputum) or you cough up blood. °· You develop shortness of breath. °  °This information is not intended to replace advice given to you by your health care provider. Make sure you discuss any questions you have with your health care provider. °  °Document Released: 06/27/2005 Document Revised: 03/18/2015 Document Reviewed: 09/22/2014 °Elsevier Interactive Patient Education ©2016 [employer] °Rib Fracture °A rib fracture is a break or crack in one of the bones of the ribs. The ribs are a group of long, curved bones that wrap around your chest and attach to your spine. They protect your lungs and other organs in the chest cavity. A broken or cracked  rib is often painful, but most do not cause other problems. Most rib fractures heal on their own over time. However, rib fractures can be more serious if multiple ribs are broken or if broken ribs move out of place and push against other structures. °CAUSES  °· A direct blow to the chest. For example, this could happen during contact sports, a car accident, or a fall against a hard object. °· Repetitive movements with high force, such as pitching a baseball or having severe coughing spells. °SYMPTOMS  °· Pain when you breathe in or cough. °· Pain when someone presses on the injured area. °DIAGNOSIS  °Your caregiver will perform a physical exam. Various imaging tests may be ordered to confirm the diagnosis and to look for related injuries. These tests may include a chest X-ray, computed tomography (CT), magnetic resonance imaging (MRI), or a bone scan. °TREATMENT  °Rib fractures usually heal on their own in 1-3 months. The longer healing period is often associated with a continued cough or other aggravating activities. During the healing period, pain control is very important. Medication is usually given to control pain. Hospitalization or surgery may be needed for more severe injuries, such as those in which multiple ribs are broken or the ribs have moved out of place.  °HOME CARE INSTRUCTIONS  °· Avoid strenuous activity and any activities or movements that cause pain. Be careful during activities and avoid bumping the injured rib. °· Gradually increase activity as directed by   your caregiver. °· Only take over-the-counter or prescription medications as directed by your caregiver. Do not take other medications without asking your caregiver first. °· Apply ice to the injured area for the first 1-2 days after you have been treated or as directed by your caregiver. Applying ice helps to reduce inflammation and pain. °¨ Put ice in a plastic bag. °¨ Place a towel between your skin and the bag.   °¨ Leave the ice on for  15-20 minutes at a time, every 2 hours while you are awake. °· Perform deep breathing as directed by your caregiver. This will help prevent pneumonia, which is a common complication of a broken rib. Your caregiver may instruct you to: °¨ Take deep breaths several times a day. °¨ Try to cough several times a day, holding a pillow against the injured area. °¨ Use a device called an incentive spirometer to practice deep breathing several times a day. °· Drink enough fluids to keep your urine clear or pale yellow. This will help you avoid constipation.   °· Do not wear a rib belt or binder. These restrict breathing, which can lead to pneumonia.   °SEEK IMMEDIATE MEDICAL CARE IF:  °· You have a fever.   °· You have difficulty breathing or shortness of breath.   °· You develop a continual cough, or you cough up thick or bloody sputum. °· You feel sick to your stomach (nausea), throw up (vomit), or have abdominal pain.   °· You have worsening pain not controlled with medications.   °MAKE SURE YOU: °· Understand these instructions. °· Will watch your condition. °· Will get help right away if you are not doing well or get worse. °  °This information is not intended to replace advice given to you by your health care provider. Make sure you discuss any questions you have with your health care provider. °  °Document Released: 06/27/2005 Document Revised: 02/27/2013 Document Reviewed: 08/29/2012 °Elsevier Interactive Patient Education ©2016 [employer] ° °

## 2015-08-10 NOTE — ED Provider Notes (Signed)
[facility] Emergency Department Provider Note  ____________________________________________  Time seen: Approximately 328 AM  I have reviewed the triage vital signs and the nursing notes.   HISTORY  Chief Complaint Chest Pain and Rib Injury    HPI Teresa Davenport is a 50 y.o. female who comes into the hospital today with some chest pain. The patient reports that she broke 2 ribs on Christmas day and reports that this morning she got sick. She reports that after vomiting she felt a pop and had some worsened pain on the left side of her chest where the rib fractures are located. She reports that she was on the floor for 45 minutes and the pain has not eased up. The patient has been following with her doctor and received an ultrasound to evaluate if she had a ruptured breast implant and has appointments in [facility]. She reports that she did not take any medicine for this pain but had been taking some Vicodin previously which she completed today. The patient rates her pain a 10 out of 10 in intensity. She reports that she ate something that side of her stomach which is what caused her to vomit but she feels better now. She just reports that she is in pain and cannot move or take a deep breath on that side.   Past Medical History  Diagnosis Date  . Rib fracture   . Renal disorder     There are no active problems to display for this patient.   Past Surgical History  Procedure Laterality Date  . Abdominal hysterectomy    . Breast surgery      Current Outpatient Rx  Name  Route  Sig  Dispense  Refill  . lidocaine (LIDODERM) 5 %   Transdermal   Place 1 patch onto the skin every 12 (twelve) hours. Remove & Discard patch within 12 hours or as directed by MD   10 patch   0   . oxyCODONE-acetaminophen (ROXICET) 5-325 MG tablet   Oral   Take 1 tablet by mouth every 4 (four) hours as needed for severe pain.   20 tablet   0   . oxyCODONE-acetaminophen  (ROXICET) 5-325 MG tablet   Oral   Take 1 tablet by mouth every 6 (six) hours as needed.   12 tablet   0     Allergies Doxycycline; Motrin; Penicillins; Toradol; and Tramadol  History reviewed. No pertinent family history.  Social History Social History  Substance Use Topics  . Smoking status: Current Every Day Smoker -- 2.00 packs/day    Types: Cigarettes  . Smokeless tobacco: None  . Alcohol Use: No    Review of Systems Constitutional: No fever/chills Eyes: No visual changes. ENT: No sore throat. Cardiovascular: chest pain. Respiratory:  shortness of breath. Gastrointestinal: Vomiting with no abdominal pain Genitourinary: Negative for dysuria. Musculoskeletal: Negative for back pain. Skin: Negative for rash. Neurological: Negative for headaches, focal weakness or numbness.  10-point ROS otherwise negative.  ____________________________________________   PHYSICAL EXAM:  VITAL SIGNS: ED Triage Vitals  Enc Vitals Group     BP 08/09/15 2359 130/89 mmHg     Pulse Rate 08/09/15 2359 110     Resp 08/09/15 2359 18     Temp 08/09/15 2358 98.3 F (36.8 C)     Temp Source 08/09/15 2358 Oral     SpO2 08/09/15 2359 99 %     Weight 08/09/15 2359 147 lb (66.679 kg)     Height  08/09/15 2359  (1.626 m)     Head Cir --      Peak Flow --      Pain Score 08/10/15 0001 10     Pain Loc --      Pain Edu? --      Excl. in GC? --     Constitutional: Alert and oriented. Well appearing and in moderate distress. Eyes: Conjunctivae are normal. PERRL. EOMI. Head: Atraumatic. Nose: No congestion/rhinnorhea. Mouth/Throat: Mucous membranes are moist.  Oropharynx non-erythematous. Cardiovascular: Normal rate, regular rhythm. Grossly normal heart sounds.  Good peripheral circulation. Respiratory: Normal respiratory effort.  No retractions. Lungs CTAB. Gastrointestinal: Soft and nontender. No distention. Positive bowel sounds Musculoskeletal: No lower extremity tenderness nor  edema.   Neurologic:  Normal speech and language. . Skin:  Skin is warm, dry and intact.  Psychiatric: Mood and affect are normal.   ____________________________________________   LABS (all labs ordered are listed, but only abnormal results are displayed)  Labs Reviewed - No data to display ____________________________________________  EKG  None ____________________________________________  RADIOLOGY  Chest x-ray: Left lateral third through fifth rib fractures likely subacute, no intrathoracic injury/pneumothorax, emphysema ____________________________________________   PROCEDURES  Procedure(s) performed: None  Critical Care performed: No  ____________________________________________   INITIAL IMPRESSION / ASSESSMENT AND PLAN / ED COURSE  Pertinent labs & imaging results that were available during my care of the patient were reviewed by me and considered in my medical decision making (see chart for details).  The patient does have a history of rib fractures. I did give the patient a shot of morphine as well as some oral Valium. The patient also received some Percocet while in the emergency department. She reports that the pain is starting to ease off. I will give her a Lidoderm patch to her left chest wall and give her another dose of Percocet. I will discharge the patient to have her follow-up with her primary care physician. ____________________________________________   FINAL CLINICAL IMPRESSION(S) / ED DIAGNOSES  Final diagnoses:  Rib fracture, left, with routine healing, subsequent encounter  Chest wall pain      Rebecka Apley, MD 08/10/15 [phone]

## 2015-08-12 HISTORY — PX: RIB FRACTURE SURGERY: SHX2358

## 2016-03-23 DIAGNOSIS — E876 Hypokalemia: Secondary | ICD-10-CM | POA: Insufficient documentation

## 2016-03-23 HISTORY — DX: Hypokalemia: E87.6

## 2016-04-15 ENCOUNTER — Inpatient Hospital Stay
Admission: EM | Admit: 2016-04-15 | Discharge: 2016-04-18 | DRG: 872 | Disposition: A | Discharge status: Home / Self Care | Payer: Self-pay | Attending: Internal Medicine | Admitting: Internal Medicine

## 2016-04-15 ENCOUNTER — Emergency Department: Payer: Self-pay

## 2016-04-15 DIAGNOSIS — N200 Calculus of kidney: Secondary | ICD-10-CM

## 2016-04-15 DIAGNOSIS — N202 Calculus of kidney with calculus of ureter: Secondary | ICD-10-CM | POA: Diagnosis present

## 2016-04-15 DIAGNOSIS — Z888 Allergy status to other drugs, medicaments and biological substances status: Secondary | ICD-10-CM

## 2016-04-15 DIAGNOSIS — R109 Unspecified abdominal pain: Secondary | ICD-10-CM

## 2016-04-15 DIAGNOSIS — G8929 Other chronic pain: Secondary | ICD-10-CM | POA: Diagnosis present

## 2016-04-15 DIAGNOSIS — R52 Pain, unspecified: Secondary | ICD-10-CM

## 2016-04-15 DIAGNOSIS — Z841 Family history of disorders of kidney and ureter: Secondary | ICD-10-CM

## 2016-04-15 DIAGNOSIS — Z87442 Personal history of urinary calculi: Secondary | ICD-10-CM

## 2016-04-15 DIAGNOSIS — A419 Sepsis, unspecified organism: Principal | ICD-10-CM | POA: Diagnosis present

## 2016-04-15 DIAGNOSIS — N10 Acute tubulo-interstitial nephritis: Secondary | ICD-10-CM | POA: Diagnosis present

## 2016-04-15 DIAGNOSIS — F17200 Nicotine dependence, unspecified, uncomplicated: Secondary | ICD-10-CM | POA: Diagnosis present

## 2016-04-15 DIAGNOSIS — Z886 Allergy status to analgesic agent status: Secondary | ICD-10-CM

## 2016-04-15 DIAGNOSIS — Z880 Allergy status to penicillin: Secondary | ICD-10-CM

## 2016-04-15 DIAGNOSIS — Z885 Allergy status to narcotic agent status: Secondary | ICD-10-CM

## 2016-04-15 DIAGNOSIS — E876 Hypokalemia: Secondary | ICD-10-CM | POA: Diagnosis present

## 2016-04-15 DIAGNOSIS — N12 Tubulo-interstitial nephritis, not specified as acute or chronic: Secondary | ICD-10-CM | POA: Diagnosis present

## 2016-04-15 DIAGNOSIS — R000 Tachycardia, unspecified: Secondary | ICD-10-CM

## 2016-04-15 DIAGNOSIS — Z881 Allergy status to other antibiotic agents status: Secondary | ICD-10-CM

## 2016-04-15 HISTORY — DX: Calculus of kidney: N20.0

## 2016-04-15 LAB — COMPREHENSIVE METABOLIC PANEL
ALBUMIN: 4.1 g/dL (ref 3.5–5.0)
ALK PHOS: 92 U/L (ref 38–126)
ALT: 14 U/L (ref 14–54)
AST: 23 U/L (ref 15–41)
Anion gap: 11 (ref 5–15)
BILIRUBIN TOTAL: 1 mg/dL (ref 0.3–1.2)
BUN: 10 mg/dL (ref 6–20)
CALCIUM: 9.3 mg/dL (ref 8.9–10.3)
CO2: 26 mmol/L (ref 22–32)
CREATININE: 0.98 mg/dL (ref 0.44–1.00)
Chloride: 93 mmol/L — ABNORMAL LOW (ref 101–111)
GFR calc Af Amer: >60 mL/min (ref >60)
GFR calc non Af Amer: >60 mL/min (ref >60)
GLUCOSE: 118 mg/dL — AB (ref 65–99)
Potassium: 3 mmol/L — ABNORMAL LOW (ref 3.5–5.1)
SODIUM: 130 mmol/L — AB (ref 135–145)
Total Protein: 8 g/dL (ref 6.5–8.1)

## 2016-04-15 LAB — CBC
HCT: 42.4 % (ref 35.0–47.0)
HEMOGLOBIN: 14.9 g/dL (ref 12.0–16.0)
MCH: 31.7 pg (ref 26.0–34.0)
MCHC: 35.2 g/dL (ref 32.0–36.0)
MCV: 90.1 fL (ref 80.0–100.0)
PLATELETS: 232 10*3/uL (ref 150–440)
RBC: 4.7 MIL/uL (ref 3.80–5.20)
RDW: 13.2 % (ref 11.5–14.5)
WBC: 18.9 10*3/uL — ABNORMAL HIGH (ref 3.6–11.0)

## 2016-04-15 LAB — URINALYSIS COMPLETE WITH MICROSCOPIC (ARMC ONLY)
BILIRUBIN URINE: NEGATIVE
GLUCOSE, UA: NEGATIVE mg/dL
KETONES UR: NEGATIVE mg/dL
NITRITE: NEGATIVE
Protein, ur: 30 mg/dL — AB
SPECIFIC GRAVITY, URINE: 1.008 (ref 1.005–1.030)
pH: 6 (ref 5.0–8.0)

## 2016-04-15 LAB — LIPASE, BLOOD: Lipase: 12 U/L (ref 11–51)

## 2016-04-15 MED ORDER — SODIUM CHLORIDE 0.9 % IV BOLUS (SEPSIS)
1000.0000 mL | Freq: Once | INTRAVENOUS | Status: AC
  Administered 2016-04-15: 1000 mL via INTRAVENOUS

## 2016-04-15 MED ORDER — HYDROMORPHONE HCL 1 MG/ML IJ SOLN
1.0000 mg | Freq: Once | INTRAMUSCULAR | Status: AC
  Administered 2016-04-15: 1 mg via INTRAVENOUS
  Filled 2016-04-15: qty 1

## 2016-04-15 MED ORDER — MORPHINE SULFATE (PF) 4 MG/ML IV SOLN
4.0000 mg | Freq: Once | INTRAVENOUS | Status: AC
Start: 2016-04-15 — End: 2016-04-15
  Administered 2016-04-15: 4 mg via INTRAVENOUS
  Filled 2016-04-15: qty 1

## 2016-04-15 MED ORDER — HYDROMORPHONE HCL 1 MG/ML IJ SOLN
1.0000 mg | Freq: Once | INTRAMUSCULAR | Status: AC
Start: 2016-04-15 — End: 2016-04-15
  Administered 2016-04-15: 1 mg via INTRAVENOUS
  Filled 2016-04-15: qty 1

## 2016-04-15 MED ORDER — CEFTRIAXONE SODIUM 1 G IJ SOLR
1.0000 g | Freq: Once | INTRAMUSCULAR | Status: AC
  Administered 2016-04-16: 1 g via INTRAVENOUS
  Filled 2016-04-15: qty 10

## 2016-04-15 MED ORDER — ONDANSETRON HCL 4 MG/2ML IJ SOLN
4.0000 mg | Freq: Once | INTRAMUSCULAR | Status: AC
  Administered 2016-04-15: 4 mg via INTRAVENOUS
  Filled 2016-04-15: qty 2

## 2016-04-15 NOTE — ED Triage Notes (Signed)
Pt from home via EMS, reports diffuse abd paun x3 days with vomiting

## 2016-04-15 NOTE — ED Notes (Signed)
Patient transported to CT 

## 2016-04-15 NOTE — ED Notes (Signed)
Pt noted to desat 88% after being given dilaudid, placed on 2L oxygen

## 2016-04-15 NOTE — ED Provider Notes (Signed)
[facility] Emergency Department Provider Note  ____________________________________________  Time seen: Approximately 11:36 PM  I have reviewed the triage vital signs and the nursing notes.   HISTORY  Chief Complaint Abdominal Pain    HPI Teresa Davenport is a 50 y.o. female who complains of right-sided abdominal pain and right flank pain for the past 3 days, constant but waxing and waning, severe. Associated with dysuria and hematuria. Has a history of kidney stones and this feels similar. Has been drinking fluids but unable to eat for the past few days. Denies fever chills or sweats. No dizziness or syncope.No aggravating or alleviating factors.     Past Medical History:  Diagnosis Date  . Renal disorder   . Rib fracture      There are no active problems to display for this patient.    Past Surgical History:  Procedure Laterality Date  . ABDOMINAL HYSTERECTOMY    . BREAST SURGERY       Prior to Admission medications   Medication Sig Start Date End Date Taking? Authorizing Provider  lidocaine (LIDODERM) 5 % Place 1 patch onto the skin every 12 (twelve) hours. Remove & Discard patch within 12 hours or as directed by MD 08/10/15 08/09/16  Rebecka Apley, MD  oxyCODONE-acetaminophen (ROXICET) 5-325 MG tablet Take 1 tablet by mouth every 4 (four) hours as needed for severe pain. 07/05/15   Phineas Semen, MD  oxyCODONE-acetaminophen (ROXICET) 5-325 MG tablet Take 1 tablet by mouth every 6 (six) hours as needed. 08/10/15   Rebecka Apley, MD     Allergies Doxycycline; Motrin [ibuprofen]; Penicillins; Toradol [ketorolac tromethamine]; and Tramadol   No family history on file.  Social History Social History  Substance Use Topics  . Smoking status: Current Every Day Smoker    Packs/day: 2.00    Types: Cigarettes  . Smokeless tobacco: Not on file  . Alcohol use No    Review of Systems  Constitutional:   No fever or chills.   ENT:   No sore throat. No rhinorrhea. Cardiovascular:   No chest pain. Respiratory:   No dyspnea or cough. Gastrointestinal:   Abdominal pain and right flank pain as above. No vomiting or diarrhea.  Genitourinary:   Positive dysuria.  10-point ROS otherwise negative.  ____________________________________________   PHYSICAL EXAM:  VITAL SIGNS: ED Triage Vitals  Enc Vitals Group     BP 04/15/16 2059 111/67     Pulse Rate 04/15/16 2059 (!) 126     Resp 04/15/16 2059 19     Temp 04/15/16 2059 99.8 F (37.7 C)     Temp src --      SpO2 04/15/16 2200 97 %     Weight 04/15/16 2056 112 lb 7 oz (51 kg)     Height 04/15/16 2056 5\' 2"  (1.575 m)     Head Circumference --      Peak Flow --      Pain Score 04/15/16 2057 10     Pain Loc --      Pain Edu? --      Excl. in GC? --     Vital signs reviewed, nursing assessments reviewed.   Constitutional:   Alert and oriented. Ill-appearing, very uncomfortable. Eyes:   No scleral icterus. No conjunctival pallor. PERRL. EOMI.  No nystagmus. ENT   Head:   Normocephalic and atraumatic.   Nose:   No congestion/rhinnorhea. No septal hematoma   Mouth/Throat:   Dry mucous membranes, no pharyngeal erythema.  No peritonsillar mass.    Neck:   No stridor. No SubQ emphysema. No meningismus. Hematological/Lymphatic/Immunilogical:   No cervical lymphadenopathy. Cardiovascular:   Tachycardia heart rate 1:30. Symmetric bilateral radial and DP pulses.  No murmurs.  Respiratory:   Normal respiratory effort without tachypnea nor retractions. Breath sounds are clear and equal bilaterally. No wheezes/rales/rhonchi. Gastrointestinal:   Soft with diffuse right-sided tenderness. Non distended. There is no CVA tenderness.  No rebound, rigidity, or guarding. Genitourinary:   deferred Musculoskeletal:   Nontender with normal range of motion in all extremities. No joint effusions.  No lower extremity tenderness.  No edema. Neurologic:   Normal speech  and language.  CN 2-10 normal. Motor grossly intact. No gross focal neurologic deficits are appreciated.  Skin:    Skin is warm, dry and intact. No rash noted.  No petechiae, purpura, or bullae.  ____________________________________________    LABS (pertinent positives/negatives) (all labs ordered are listed, but only abnormal results are displayed) Labs Reviewed  COMPREHENSIVE METABOLIC PANEL - Abnormal; Notable for the following:       Result Value   Sodium 130 (*)    Potassium 3.0 (*)    Chloride 93 (*)    Glucose, Bld 118 (*)    All other components within normal limits  CBC - Abnormal; Notable for the following:    WBC 18.9 (*)    All other components within normal limits  URINALYSIS COMPLETEWITH MICROSCOPIC ([facility] ONLY) - Abnormal; Notable for the following:    Color, Urine YELLOW (*)    APPearance HAZY (*)    Hgb urine dipstick 3+ (*)    Protein, ur 30 (*)    Leukocytes, UA 1+ (*)    Bacteria, UA RARE (*)    Squamous Epithelial / LPF 0-5 (*)    All other components within normal limits  URINE CULTURE  LIPASE, BLOOD   ____________________________________________   EKG  Interpreted by me Sinus tachycardia rate 133, normal axis intervals QRS ST segments and T waves  ____________________________________________    RADIOLOGY  CT abdomen pelvis rules 5 mm stone at the right ureteropelvic junction with moderate obstruction  ____________________________________________   PROCEDURES Procedures  ____________________________________________   INITIAL IMPRESSION / ASSESSMENT AND PLAN / ED COURSE  Pertinent labs & imaging results that were available during my care of the patient were reviewed by me and considered in my medical decision making (see chart for details).  Patient presents with severe right-sided pain, found to have a 5 mm stone. Persistent tachycardia, persistent severe pain despite multiple doses of IV opioids to the point that she's having some  intermittent hypoxia. Discussed with the hospitalist for further management and neurologic consultation in the morning. Creatinine is preserved, is no evidence of urinary tract infection to complicate the obstruction, but urine culture is sent and I'll give the patient IV ceftriaxone as a precautionary measure.     Clinical Course   ____________________________________________   FINAL CLINICAL IMPRESSION(S) / ED DIAGNOSES  Final diagnoses:  Intractable abdominal pain  Sinus tachycardia       Portions of this note were generated with dragon dictation software. Dictation errors may occur despite best attempts at proofreading.    Sharman CheekPhillip Posie Lillibridge, MD 04/15/16 [phone]

## 2016-04-16 ENCOUNTER — Observation Stay: Payer: Self-pay | Admitting: Anesthesiology

## 2016-04-16 ENCOUNTER — Encounter
Admission: EM | Disposition: A | Discharge status: Home / Self Care | Payer: Self-pay | Source: Home / Self Care | Attending: Internal Medicine

## 2016-04-16 ENCOUNTER — Observation Stay: Payer: Self-pay

## 2016-04-16 ENCOUNTER — Encounter: Payer: Self-pay | Admitting: Internal Medicine

## 2016-04-16 DIAGNOSIS — N200 Calculus of kidney: Secondary | ICD-10-CM

## 2016-04-16 HISTORY — PX: CYSTOSCOPY WITH STENT PLACEMENT: SHX5790

## 2016-04-16 LAB — CBC
HCT: 34.7 % — ABNORMAL LOW (ref 35.0–47.0)
Hemoglobin: 12 g/dL (ref 12.0–16.0)
MCH: 31.5 pg (ref 26.0–34.0)
MCHC: 34.5 g/dL (ref 32.0–36.0)
MCV: 91.3 fL (ref 80.0–100.0)
PLATELETS: 199 10*3/uL (ref 150–440)
RBC: 3.8 MIL/uL (ref 3.80–5.20)
RDW: 13.4 % (ref 11.5–14.5)
WBC: 15.8 10*3/uL — ABNORMAL HIGH (ref 3.6–11.0)

## 2016-04-16 LAB — BASIC METABOLIC PANEL
Anion gap: 8 (ref 5–15)
BUN: 8 mg/dL (ref 6–20)
CALCIUM: 8.4 mg/dL — AB (ref 8.9–10.3)
CHLORIDE: 100 mmol/L — AB (ref 101–111)
CO2: 25 mmol/L (ref 22–32)
CREATININE: 0.75 mg/dL (ref 0.44–1.00)
GFR calc Af Amer: >60 mL/min (ref >60)
GFR calc non Af Amer: >60 mL/min (ref >60)
Glucose, Bld: 101 mg/dL — ABNORMAL HIGH (ref 65–99)
Potassium: 2.5 mmol/L — CL (ref 3.5–5.1)
Sodium: 133 mmol/L — ABNORMAL LOW (ref 135–145)

## 2016-04-16 LAB — POTASSIUM
POTASSIUM: 4.1 mmol/L (ref 3.5–5.1)
Potassium: 3.5 mmol/L (ref 3.5–5.1)

## 2016-04-16 LAB — MRSA PCR SCREENING: MRSA BY PCR: NEGATIVE

## 2016-04-16 SURGERY — CYSTOSCOPY, WITH STENT INSERTION
Anesthesia: General | Wound class: Clean Contaminated

## 2016-04-16 MED ORDER — HYDROMORPHONE HCL 1 MG/ML IJ SOLN
0.5000 mg | INTRAMUSCULAR | Status: DC | PRN
  Administered 2016-04-16 (×2): 0.5 mg via INTRAVENOUS
  Filled 2016-04-16 (×3): qty 1

## 2016-04-16 MED ORDER — POTASSIUM CHLORIDE CRYS ER 20 MEQ PO TBCR
40.0000 meq | EXTENDED_RELEASE_TABLET | ORAL | Status: AC
  Administered 2016-04-16: 40 meq via ORAL
  Filled 2016-04-16: qty 2

## 2016-04-16 MED ORDER — SODIUM CHLORIDE 0.9 % IV SOLN
INTRAVENOUS | Status: DC
  Administered 2016-04-16: 02:00:00 via INTRAVENOUS

## 2016-04-16 MED ORDER — PROPOFOL 10 MG/ML IV BOLUS
INTRAVENOUS | Status: DC | PRN
  Administered 2016-04-16: 100 mg via INTRAVENOUS

## 2016-04-16 MED ORDER — PHENYLEPHRINE HCL 10 MG/ML IJ SOLN
INTRAMUSCULAR | Status: DC | PRN
  Administered 2016-04-16: 300 ug via INTRAVENOUS
  Administered 2016-04-16: 200 ug via INTRAVENOUS
  Administered 2016-04-16: 400 ug via INTRAVENOUS

## 2016-04-16 MED ORDER — TAMSULOSIN HCL 0.4 MG PO CAPS
0.4000 mg | ORAL_CAPSULE | Freq: Every day | ORAL | Status: DC
  Administered 2016-04-16 – 2016-04-18 (×3): 0.4 mg via ORAL
  Filled 2016-04-16 (×3): qty 1

## 2016-04-16 MED ORDER — HYDROMORPHONE HCL 1 MG/ML IJ SOLN
0.5000 mg | INTRAMUSCULAR | Status: AC
  Administered 2016-04-16: 0.5 mg via INTRAVENOUS
  Filled 2016-04-16: qty 1

## 2016-04-16 MED ORDER — MIDAZOLAM HCL 2 MG/2ML IJ SOLN
INTRAMUSCULAR | Status: DC | PRN
Start: 2016-04-16 — End: 2016-04-16
  Administered 2016-04-16: 2 mg via INTRAVENOUS

## 2016-04-16 MED ORDER — POTASSIUM CHLORIDE 10 MEQ/100ML IV SOLN
10.0000 meq | INTRAVENOUS | Status: DC
  Administered 2016-04-16: 10 meq via INTRAVENOUS
  Filled 2016-04-16 (×4): qty 100

## 2016-04-16 MED ORDER — LACTATED RINGERS IV SOLN
INTRAVENOUS | Status: DC | PRN
  Administered 2016-04-16: 13:00:00 via INTRAVENOUS

## 2016-04-16 MED ORDER — POTASSIUM CHLORIDE IN NACL 20-0.9 MEQ/L-% IV SOLN
INTRAVENOUS | Status: DC
  Administered 2016-04-16 – 2016-04-18 (×4): via INTRAVENOUS
  Filled 2016-04-16 (×6): qty 1000

## 2016-04-16 MED ORDER — ONDANSETRON HCL 4 MG/2ML IJ SOLN: 4.0000 mg | Freq: Once | INTRAMUSCULAR | Status: DC | PRN

## 2016-04-16 MED ORDER — POTASSIUM CHLORIDE CRYS ER 20 MEQ PO TBCR
40.0000 meq | EXTENDED_RELEASE_TABLET | ORAL | Status: DC
  Administered 2016-04-16: 40 meq via ORAL
  Filled 2016-04-16: qty 2

## 2016-04-16 MED ORDER — ACETAMINOPHEN 650 MG RE SUPP: 650.0000 mg | Freq: Four times a day (QID) | RECTAL | Status: DC | PRN

## 2016-04-16 MED ORDER — HYDROMORPHONE HCL 1 MG/ML IJ SOLN
1.0000 mg | INTRAMUSCULAR | Status: DC | PRN
  Administered 2016-04-16: 2 mg via INTRAVENOUS
  Administered 2016-04-16: 1 mg via INTRAVENOUS
  Administered 2016-04-16 – 2016-04-18 (×7): 2 mg via INTRAVENOUS
  Administered 2016-04-18: 1 mg via INTRAVENOUS
  Administered 2016-04-18: 2 mg via INTRAVENOUS
  Filled 2016-04-16 (×5): qty 2
  Filled 2016-04-16 (×2): qty 1
  Filled 2016-04-16 (×3): qty 2
  Filled 2016-04-16: qty 1
  Filled 2016-04-16 (×2): qty 2

## 2016-04-16 MED ORDER — FENTANYL CITRATE (PF) 100 MCG/2ML IJ SOLN
INTRAMUSCULAR | Status: DC | PRN
  Administered 2016-04-16: 25 ug via INTRAVENOUS
  Administered 2016-04-16: 50 ug via INTRAVENOUS
  Administered 2016-04-16: 25 ug via INTRAVENOUS

## 2016-04-16 MED ORDER — ONDANSETRON HCL 4 MG/2ML IJ SOLN
4.0000 mg | Freq: Four times a day (QID) | INTRAMUSCULAR | Status: DC | PRN
  Administered 2016-04-16 – 2016-04-17 (×5): 4 mg via INTRAVENOUS
  Filled 2016-04-16 (×5): qty 2

## 2016-04-16 MED ORDER — LIDOCAINE HCL (CARDIAC) 20 MG/ML IV SOLN
INTRAVENOUS | Status: DC | PRN
  Administered 2016-04-16: 100 mg via INTRAVENOUS

## 2016-04-16 MED ORDER — SODIUM CHLORIDE 0.9 % IV BOLUS (SEPSIS)
1000.0000 mL | INTRAVENOUS | Status: DC | PRN
  Administered 2016-04-16: 1000 mL via INTRAVENOUS
  Filled 2016-04-16: qty 1000

## 2016-04-16 MED ORDER — FENTANYL CITRATE (PF) 100 MCG/2ML IJ SOLN: 25.0000 ug | INTRAMUSCULAR | Status: DC | PRN

## 2016-04-16 MED ORDER — ONDANSETRON HCL 4 MG/2ML IJ SOLN
INTRAMUSCULAR | Status: DC | PRN
  Administered 2016-04-16: 4 mg via INTRAVENOUS

## 2016-04-16 MED ORDER — SUCCINYLCHOLINE CHLORIDE 20 MG/ML IJ SOLN
INTRAMUSCULAR | Status: DC | PRN
  Administered 2016-04-16: 100 mg via INTRAVENOUS

## 2016-04-16 MED ORDER — ENOXAPARIN SODIUM 40 MG/0.4ML ~~LOC~~ SOLN: 40.0000 mg | SUBCUTANEOUS | Status: DC

## 2016-04-16 MED ORDER — DEXTROSE 5 % IV SOLN
INTRAVENOUS | Status: DC | PRN
  Administered 2016-04-16: 1 g via INTRAVENOUS

## 2016-04-16 MED ORDER — CEFTRIAXONE SODIUM 1 G IJ SOLR
INTRAMUSCULAR | Status: AC
  Filled 2016-04-16: qty 10

## 2016-04-16 MED ORDER — SODIUM CHLORIDE 0.9 % IV BOLUS (SEPSIS)
1000.0000 mL | Freq: Once | INTRAVENOUS | Status: AC
  Administered 2016-04-16: 1000 mL via INTRAVENOUS

## 2016-04-16 MED ORDER — DEXAMETHASONE SODIUM PHOSPHATE 10 MG/ML IJ SOLN
INTRAMUSCULAR | Status: DC | PRN
  Administered 2016-04-16: 10 mg via INTRAVENOUS

## 2016-04-16 MED ORDER — ACETAMINOPHEN 325 MG PO TABS
650.0000 mg | ORAL_TABLET | Freq: Four times a day (QID) | ORAL | Status: DC | PRN
  Administered 2016-04-16: 650 mg via ORAL
  Filled 2016-04-16: qty 2

## 2016-04-16 MED ORDER — OXYCODONE-ACETAMINOPHEN 5-325 MG PO TABS
1.0000 | ORAL_TABLET | ORAL | Status: DC | PRN
  Administered 2016-04-16 – 2016-04-17 (×5): 1 via ORAL
  Filled 2016-04-16 (×5): qty 1

## 2016-04-16 MED ORDER — ONDANSETRON HCL 4 MG PO TABS: 4.0000 mg | ORAL_TABLET | Freq: Four times a day (QID) | ORAL | Status: DC | PRN

## 2016-04-16 SURGICAL SUPPLY — 23 items
BAG DRAIN CYSTO-URO LG1000N (MISCELLANEOUS) ×3 IMPLANT
CATH FOL 2WAY LX 16X5 (CATHETERS) IMPLANT
CATH URETL 5X70 OPEN END (CATHETERS) ×3 IMPLANT
CONRAY 43 FOR UROLOGY 50M (MISCELLANEOUS) ×3 IMPLANT
GLOVE BIO SURGEON STRL SZ 6.5 (GLOVE) ×6 IMPLANT
GLOVE BIO SURGEONS STRL SZ 6.5 (GLOVE) ×3
GOWN STRL REUS W/ TWL LRG LVL4 (GOWN DISPOSABLE) ×2 IMPLANT
GOWN STRL REUS W/TWL LRG LVL4 (GOWN DISPOSABLE) ×4
HOLDER FOLEY CATH W/STRAP (MISCELLANEOUS) IMPLANT
KIT RM TURNOVER CYSTO AR (KITS) ×3 IMPLANT
PACK CYSTO AR (MISCELLANEOUS) ×3 IMPLANT
SENSORWIRE 0.038 NOT ANGLED (WIRE) ×3
SET CYSTO W/LG BORE CLAMP LF (SET/KITS/TRAYS/PACK) ×3 IMPLANT
SOL .9 NS 3000ML IRR  AL (IV SOLUTION) ×2
SOL .9 NS 3000ML IRR UROMATIC (IV SOLUTION) ×1 IMPLANT
STENT CONTOUR 7FRX24 (STENTS) ×3 IMPLANT
STENT URET 6FRX24 CONTOUR (STENTS) ×3 IMPLANT
STENT URET 6FRX26 CONTOUR (STENTS) IMPLANT
STENT URETL CONTOUR 7X24 (Stent) ×3 IMPLANT
SURGILUBE 2OZ TUBE FLIPTOP (MISCELLANEOUS) ×3 IMPLANT
SYRINGE IRR TOOMEY STRL 70CC (SYRINGE) ×3 IMPLANT
WATER STERILE IRR 1000ML POUR (IV SOLUTION) ×3 IMPLANT
WIRE SENSOR 0.038 NOT ANGLED (WIRE) ×1 IMPLANT

## 2016-04-16 NOTE — Progress Notes (Signed)
[employer] [facility] [place] [facility]   PATIENT NAME: Teresa Davenport    MR#:  [id number]  DATE OF BIRTH:  Mar 17, 1966  SUBJECTIVE:  CHIEF COMPLAINT:   Chief Complaint  Patient presents with  . Abdominal Pain   - complains of severe abdominal pain,  And right flank pain - spiked fever last night, CT abd with 5mm right renal stone- urology consult pending - remains NPO  REVIEW OF SYSTEMS:  Review of Systems  Constitutional: Positive for fever and malaise/fatigue. Negative for chills.  HENT: Negative for ear discharge and ear pain.   Respiratory: Negative for cough, shortness of breath and wheezing.   Cardiovascular: Negative for chest pain, palpitations and leg swelling.  Gastrointestinal: Positive for abdominal pain. Negative for constipation, diarrhea, nausea and vomiting.  Genitourinary: Positive for urgency. Negative for dysuria.  Musculoskeletal: Positive for back pain.  Neurological: Negative for dizziness, sensory change, speech change, focal weakness, seizures and headaches.  Psychiatric/Behavioral: Negative for depression.    DRUG ALLERGIES:   Allergies  Allergen Reactions  . Doxycycline Swelling  . Motrin [Ibuprofen] Swelling  . Penicillins Swelling    Has patient had a PCN reaction causing immediate rash, facial/tongue/throat swelling, SOB or lightheadedness with hypotension: Yes Has patient had a PCN reaction causing severe rash involving mucus membranes or skin necrosis: No Has patient had a PCN reaction that required hospitalization No Has patient had a PCN reaction occurring within the last 10 years: No If all of the above answers are "NO", then may proceed with Cephalosporin use.  . Toradol [Ketorolac Tromethamine] Swelling  . Tramadol Swelling    VITALS:  Blood pressure (!) 90/48, pulse (!) 103, temperature 98.9 F (37.2 C), temperature source Oral, resp. rate 16, height 5\' 2"  (1.575 m), weight 51 kg (112 lb 7 oz), SpO2 98  %.  PHYSICAL EXAMINATION:  Physical Exam  GENERAL:  50 y.o.-year-old patient lying in the bed and seems to be in severe pain.  EYES: Pupils equal, round, reactive to light and accommodation. No scleral icterus. Extraocular muscles intact.  HEENT: Head atraumatic, normocephalic. Oropharynx and nasopharynx clear.  NECK:  Supple, no jugular venous distention. No thyroid enlargement, no tenderness.  LUNGS: Normal breath sounds bilaterally, no wheezing, rales,rhonchi or crepitation. No use of accessory muscles of respiration.  CARDIOVASCULAR: S1, S2 normal. No murmurs, rubs, or gallops.  ABDOMEN: severe abdominal tenderness all over, no rigidity or rebound tenderness, no signs of peritonitis. Bowel sounds present. No organomegaly or mass.  EXTREMITIES: No pedal edema, cyanosis, or clubbing.  NEUROLOGIC: Cranial nerves II through XII are intact. Muscle strength 5/5 in all extremities. Sensation intact. Gait not checked.  PSYCHIATRIC: The patient is alert and oriented x 3. Very anxious SKIN: No obvious rash, lesion, or ulcer.    LABORATORY PANEL:   CBC  Recent Labs Lab 04/16/16 0519  WBC 15.8*  HGB 12.0  HCT 34.7*  PLT 199   ------------------------------------------------------------------------------------------------------------------  Chemistries   Recent Labs Lab 04/15/16 2102 04/16/16 0519  NA 130* 133*  K 3.0* 2.5*  CL 93* 100*  CO2 26 25  GLUCOSE 118* 101*  BUN 10 8  CREATININE 0.98 0.75  CALCIUM 9.3 8.4*  AST 23  --   ALT 14  --   ALKPHOS 92  --   BILITOT 1.0  --    ------------------------------------------------------------------------------------------------------------------  Cardiac Enzymes No results for input(s): TROPONINI in the last 168 hours. ------------------------------------------------------------------------------------------------------------------  RADIOLOGY:  Ct Renal Stone Study  Result Date: 04/15/2016 CLINICAL  DATA:  Diffuse  abdominal pain for 3 days. Vomiting. Right flank pain. EXAM: CT ABDOMEN AND PELVIS WITHOUT CONTRAST TECHNIQUE: Multidetector CT imaging of the abdomen and pelvis was performed following the standard protocol without IV contrast. COMPARISON:  12/18/2012 FINDINGS: Lower chest: Emphysematous changes in the lung bases. Bilateral breast implants. Hepatobiliary: Unenhanced appearance is unremarkable. Pancreas: Unenhanced appearance is unremarkable. Spleen: Unenhanced appearance is unremarkable. Adrenals/Urinary Tract: No adrenal gland nodules. Bilateral intrarenal stones, largest is in the left lower pole and measures 5 x 12 mm. 5 mm stone in the right proximal ureter at the ureteropelvic junction. There is proximal hydronephrosis with distal ureteral decompression. Right kidney is larger than the left and there is stranding around the right kidney. Left kidney demonstrates chronic areas of scarring. Bladder wall is not thickened and no bladder stones are seen. Stomach/Bowel: Stomach, small bowel, and colon are not abnormally distended. Gas and stool seen throughout the colon. Appendix is not identified. Vascular/Lymphatic: Calcification of the aorta. Retroperitoneal lymphadenopathy with periaortic and pericaval nodes measuring up to about 18 mm diameter. This appearance is similar to the previous study and is nonspecific. Based on long-term stability, this would suggest a benign process. Inflammatory process or lymphoproliferative disorder not entirely excluded. Reproductive: Status post hysterectomy. No adnexal masses. Other: No abdominal wall hernia or abnormality. No abdominopelvic ascites. Musculoskeletal: No acute or significant osseous findings. IMPRESSION: 5 mm stone in the right proximal ureter at the ureteropelvic junction with moderate proximal obstruction. Additional nonobstructing intrarenal stones bilaterally. Nonspecific retroperitoneal lymphadenopathy is unchanged since previous study. Electronically  Signed   By: Burman NievesWilliam  Stevens M.D.   On: 04/15/2016 22:09    EKG:   Orders placed or performed during the hospital encounter of 04/15/16  . EKG 12-Lead  . EKG 12-Lead    ASSESSMENT AND PLAN:   50 y/o F with PMH significant for rib fractures, chronic pain, renal stones in past admitted for severe right flank pain and fevers  #1 Sepsis- with fever, wbc- from acute pyelonephritis - f/u urine and blood cultures - agree with IV ABX- rocephin  #2 Right renal stone- proximal ureter, but with sepsis findings, keep NPO - urology consulted - last renal stone removal was almost 10 yrs ago, recent hospitalization last month at a different hospital for same findings - flomax added  #3 Hypokalemia- GI losses - IV fluids, potassium being replaced, recheck later today  #4 DVT Prophylaxis- discontinue lovenox as likely might need procedure   Discussed with family and also urologist Dr. Jerrel IvoryWollin   All the records are reviewed and case discussed with Care Management/Social Workerr. Management plans discussed with the patient, family and they are in agreement.  CODE STATUS: Full Code  TOTAL TIME TAKING CARE OF THIS PATIENT: 38 minutes.   POSSIBLE D/C IN 2 DAYS, DEPENDING ON CLINICAL CONDITION.   Enid BaasKALISETTI,Arlow Spiers M.D on 04/16/2016 at 10:16 AM  Between 7am to 6pm - Pager - [phone]  After 6pm go to [email or web address] - Social research officer, governmentpassword [email or web address] [email or web address]  [facility]  Office  [phone]  CC: Primary care physician; Sandrea Hughsubio, Jessica, NP

## 2016-04-16 NOTE — ED Notes (Signed)
Pt. States morphine did not help at all.

## 2016-04-16 NOTE — Op Note (Signed)
Preoperative diagnosis:  1. Febrile stone   Postoperative diagnosis:  1. same   Procedure:  1. Cystoscopy 2. Right ureteral stent placement 3. Right retrograde pyelography with interpretation  4. Flouroscopy  Surgeon: Zonia Kiefaniel Wollin, MD  Anesthesia: General  Complications: None  Intraoperative findings: right retrograde pyelogram without obvious abnormality, difficult passage of right ureteral stent likely due to obstructing stone  EBL: Minimal  Specimens: Right kidney urine for culture  Indication: Teresa Davenport is a patient with right proximal obstructing ureteral stone with fevers. After reviewing the management options for treatment, she elected to proceed with the above surgical procedure(s). We have discussed the potential benefits and risks of the procedure, side effects of the proposed treatment, the likelihood of the patient achieving the goals of the procedure, and any potential problems that might occur during the procedure or recuperation. Informed consent has been obtained.  Description of procedure:  The patient was taken to the operating room and general anesthesia was induced.  The patient was placed in the dorsal lithotomy position, prepped and draped in the usual sterile fashion, and preoperative antibiotics were administered. A preoperative time-out was performed.   Cystourethroscopy was performed.  The patients urethra was examined and was normal. The bladder was then systematically examined in its entirety. There was no evidence for any bladder tumors, stones, or other mucosal pathology.    Attention then turned to the right ureteral orifice and a ureteral catheter was used to intubate the ureteral orifice.  Omnipaque contrast was injected through the ureteral catheter and a retrograde pyelogram was performed with findings as dictated above.  A 0.38 glidewire was then advanced up the right ureter into the renal pelvis under fluoroscopic guidance.  A 6x24  ureteral stent was advance over the wire using.  The stent was positioned appropriately under fluoroscopic and cystoscopic guidance.  The wire was then removed with an adequate stent curl noted in the renal pelvis as well as in the bladder.  The bladder was then emptied and the procedure ended.  The patient appeared to tolerate the procedure well and without complications. The patient was able to be awakened and transferred to the recovery unit in satisfactory condition.

## 2016-04-16 NOTE — Consult Note (Signed)
History and Physical Examination  Date: 04/16/2016  Patient name: Teresa Davenport Medical record number: [id number] Date of birth: [birth year] Age: 50 y.o. Gender: female PCP: Sandrea Hughs, NP  Chief Complaint:  Chief Complaint  Patient presents with   Abdominal Pain     History of Present Illness: Teresa Davenport is an 50 y.o. female with history of nephrolithiasis. She was found at OSH to have right sided obstructing proximal stone about one month prior but never followed up with urologist. Since that time she has had continued fevers and pain, presented to ER overnight last night. She was admitted for pain control and spiked to >101 around 2am.  Urology was consulted at 8am this morning. Patient is in pain (R flank pain radiating through abdomen) and notes nausea/subjective fevers/chills. Has not taken PO in a few days per patient. Given a dose of ceftriaxone in ER.  Past Medical History Past Medical History:  Diagnosis Date   Kidney stone    Pancreatitis    Renal disorder    Rib fracture     Past Surgical History Past Surgical History:  Procedure Laterality Date   ABDOMINAL HYSTERECTOMY     BREAST SURGERY      Home Meds: Prior to Admission medications   Medication Sig Start Date End Date Taking? Authorizing Provider  oxyCODONE-acetaminophen (ROXICET) 5-325 MG tablet Take 1 tablet by mouth every 6 (six) hours as needed. Patient taking differently: Take 1-2 tablets by mouth every 6 (six) hours as needed for moderate pain or severe pain.  08/10/15  Yes Rebecka Apley, MD  lidocaine (LIDODERM) 5 % Place 1 patch onto the skin every 12 (twelve) hours. Remove & Discard patch within 12 hours or as directed by MD Patient not taking: Reported on 04/15/2016 08/10/15 08/09/16  Rebecka Apley, MD    Allergies: Doxycycline; Motrin [ibuprofen]; Penicillins; Toradol [ketorolac tromethamine]; and Tramadol  Social History:  Social History   Social History    Marital status: Married    Spouse name: N/A   Number of children: N/A   Years of education: N/A   Occupational History   Not on file.   Social History Main Topics   Smoking status: Current Every Day Smoker    Packs/day: 2.00    Types: Cigarettes   Smokeless tobacco: Never Used   Alcohol use No   Drug use: No   Sexual activity: Not on file   Other Topics Concern   Not on file   Social History Narrative   No narrative on file   Family History:  Family History  Problem Relation Age of Onset   Kidney disease Mother    Pancreatic cancer Father     Review of Systems: Pertinent items are noted in HPI. All other systems reviewed and reported as negative.   Physical Exam: Blood pressure (!) 90/48, pulse (!) 103, temperature 98.9 F (37.2 C), temperature source Oral, resp. rate 16, height 5\' 2"  (1.575 m), weight 51 kg (112 lb 7 oz), SpO2 98 %. BP (!) 90/48 (BP Location: Left Arm)    Pulse (!) 103    Temp 98.9 F (37.2 C) (Oral)    Resp 16    Ht 5\' 2"  (1.575 m)    Wt 51 kg (112 lb 7 oz)    SpO2 98%    BMI 20.56 kg/m  General appearance: alert, cooperative and moderate distress Head: Normocephalic, without obvious abnormality, atraumatic Neck: no adenopathy, no carotid bruit, no JVD, supple, symmetrical, trachea midline  and thyroid not enlarged, symmetric, no tenderness/mass/nodules Back: R CVAT Lungs: normal percussion bilaterally Heart: tachycardic Abdomen: soft, non-tender; bowel sounds normal; no masses,  no organomegaly Extremities: extremities normal, atraumatic, no cyanosis or edema Skin: Skin color, texture, turgor normal. No rashes or lesions Neurologic: Grossly normal  Lab  And Imaging results:  Results for orders placed or performed during the hospital encounter of 04/15/16 (from the past 24 hour(s))  Lipase, blood     Status: None   Collection Time: 04/15/16  9:02 PM  Result Value Ref Range   Lipase 12 11 - 51 U/L  Comprehensive metabolic panel      Status: Abnormal   Collection Time: 04/15/16  9:02 PM  Result Value Ref Range   Sodium 130 (L) 135 - 145 mmol/L   Potassium 3.0 (L) 3.5 - 5.1 mmol/L   Chloride 93 (L) 101 - 111 mmol/L   CO2 26 22 - 32 mmol/L   Glucose, Bld 118 (H) 65 - 99 mg/dL   BUN 10 6 - 20 mg/dL   Creatinine, Ser 1.610.98 0.44 - 1.00 mg/dL   Calcium 9.3 8.9 - 09.610.3 mg/dL   Total Protein 8.0 6.5 - 8.1 g/dL   Albumin 4.1 3.5 - 5.0 g/dL   AST 23 15 - 41 U/L   ALT 14 14 - 54 U/L   Alkaline Phosphatase 92 38 - 126 U/L   Total Bilirubin 1.0 0.3 - 1.2 mg/dL   GFR calc non Af Amer >60 >60 mL/min   GFR calc Af Amer >60 >60 mL/min   Anion gap 11 5 - 15  CBC     Status: Abnormal   Collection Time: 04/15/16  9:02 PM  Result Value Ref Range   WBC 18.9 (H) 3.6 - 11.0 K/uL   RBC 4.70 3.80 - 5.20 MIL/uL   Hemoglobin 14.9 12.0 - 16.0 g/dL   HCT 04.542.4 40.935.0 - 81.147.0 %   MCV 90.1 80.0 - 100.0 fL   MCH 31.7 26.0 - 34.0 pg   MCHC 35.2 32.0 - 36.0 g/dL   RDW 91.413.2 78.211.5 - 95.614.5 %   Platelets 232 150 - 440 K/uL  Urinalysis complete, with microscopic     Status: Abnormal   Collection Time: 04/15/16 10:34 PM  Result Value Ref Range   Color, Urine YELLOW (A) YELLOW   APPearance HAZY (A) CLEAR   Glucose, UA NEGATIVE NEGATIVE mg/dL   Bilirubin Urine NEGATIVE NEGATIVE   Ketones, ur NEGATIVE NEGATIVE mg/dL   Specific Gravity, Urine 1.008 1.005 - 1.030   Hgb urine dipstick 3+ (A) NEGATIVE   pH 6.0 5.0 - 8.0   Protein, ur 30 (A) NEGATIVE mg/dL   Nitrite NEGATIVE NEGATIVE   Leukocytes, UA 1+ (A) NEGATIVE   RBC / HPF TOO NUMEROUS TO COUNT 0 - 5 RBC/hpf   WBC, UA 6-30 0 - 5 WBC/hpf   Bacteria, UA RARE (A) NONE SEEN   Squamous Epithelial / LPF 0-5 (A) NONE SEEN   Mucous PRESENT   Basic metabolic panel     Status: Abnormal   Collection Time: 04/16/16  5:19 AM  Result Value Ref Range   Sodium 133 (L) 135 - 145 mmol/L   Potassium 2.5 (LL) 3.5 - 5.1 mmol/L   Chloride 100 (L) 101 - 111 mmol/L   CO2 25 22 - 32 mmol/L   Glucose, Bld 101 (H)  65 - 99 mg/dL   BUN 8 6 - 20 mg/dL   Creatinine, Ser 2.130.75 0.44 - 1.00 mg/dL  Calcium 8.4 (L) 8.9 - 10.3 mg/dL   GFR calc non Af Amer >60 >60 mL/min   GFR calc Af Amer >60 >60 mL/min   Anion gap 8 5 - 15  CBC     Status: Abnormal   Collection Time: 04/16/16  5:19 AM  Result Value Ref Range   WBC 15.8 (H) 3.6 - 11.0 K/uL   RBC 3.80 3.80 - 5.20 MIL/uL   Hemoglobin 12.0 12.0 - 16.0 g/dL   HCT 16.1 (L) 09.6 - 04.5 %   MCV 91.3 80.0 - 100.0 fL   MCH 31.5 26.0 - 34.0 pg   MCHC 34.5 32.0 - 36.0 g/dL   RDW 40.9 81.1 - 91.4 %   Platelets 199 150 - 440 K/uL   Imaging  CT stone protocol 04/15/16: 5 mm stone in the right proximal ureter at the ureteropelvic junction with moderate proximal obstruction. Additional nonobstructing intrarenal stones bilaterally. Nonspecific retroperitoneal lymphadenopathy is unchanged since previous study.  Impression  Principal Problem:   Kidney stone  50yo F with history of stones, right proximal obstructing ureteral stone with fevers/elevated WBC, concern for developing urosepsis  Plan  To OR for cystoscopy, right ureteral stent placement to deobstruct kidney Will continue ceftriaxone and narrow to PO antibiotics per culture results Will require 2 weeks of antibiotics prior to outpatient stone treatment, patient may followup postoperatively with outpatient urologist  Zonia Kief MD

## 2016-04-16 NOTE — Progress Notes (Signed)
MD notified of critical potassium 2.5 

## 2016-04-16 NOTE — H&P (Signed)
[facility] [place] at [facility]   PATIENT NAME: Teresa Davenport    MR#:  [id number]  DATE OF BIRTH:  02/07/1966  DATE OF ADMISSION:  04/15/2016  PRIMARY CARE PHYSICIAN: Sandrea Hughsubio, Jessica, NP   REQUESTING/REFERRING PHYSICIAN: Scotty CourtStafford, MD  CHIEF COMPLAINT:   Chief Complaint  Patient presents with  . Abdominal Pain    HISTORY OF PRESENT ILLNESS:  Teresa Finneratricia Barsamian  is a 50 y.o. female who presents with Lower abdominal and flank pain. Patient has a known history of kidney stones and states that she's been having symptoms from her known kidney stone for the past several weeks. Her last 3 days they have gotten significantly worse. She came to the ED for evaluation was found to have a 5 mm stone at the ureteropelvic junction. She has significantly difficult to control pain, and so hospitalists were called for admission.  PAST MEDICAL HISTORY:   Past Medical History:  Diagnosis Date  . Kidney stone   . Pancreatitis   . Renal disorder   . Rib fracture     PAST SURGICAL HISTORY:   Past Surgical History:  Procedure Laterality Date  . ABDOMINAL HYSTERECTOMY    . BREAST SURGERY      SOCIAL HISTORY:   Social History  Substance Use Topics  . Smoking status: Current Every Day Smoker    Packs/day: 2.00    Types: Cigarettes  . Smokeless tobacco: Not on file  . Alcohol use No    FAMILY HISTORY:   Family History  Problem Relation Age of Onset  . Kidney disease Mother   . Pancreatic cancer Father     DRUG ALLERGIES:   Allergies  Allergen Reactions  . Doxycycline Swelling  . Motrin [Ibuprofen] Swelling  . Penicillins Swelling    Has patient had a PCN reaction causing immediate rash, facial/tongue/throat swelling, SOB or lightheadedness with hypotension: Yes Has patient had a PCN reaction causing severe rash involving mucus membranes or skin necrosis: No Has patient had a PCN reaction that required hospitalization No Has patient had a PCN  reaction occurring within the last 10 years: No If all of the above answers are "NO", then may proceed with Cephalosporin use.  . Toradol [Ketorolac Tromethamine] Swelling  . Tramadol Swelling    MEDICATIONS AT HOME:   Prior to Admission medications   Medication Sig Start Date End Date Taking? Authorizing Provider  oxyCODONE-acetaminophen (ROXICET) 5-325 MG tablet Take 1 tablet by mouth every 6 (six) hours as needed. Patient taking differently: Take 1-2 tablets by mouth every 6 (six) hours as needed for moderate pain or severe pain.  08/10/15  Yes Rebecka ApleyAllison P Webster, MD  lidocaine (LIDODERM) 5 % Place 1 patch onto the skin every 12 (twelve) hours. Remove & Discard patch within 12 hours or as directed by MD Patient not taking: Reported on 04/15/2016 08/10/15 08/09/16  Rebecka ApleyAllison P Webster, MD    REVIEW OF SYSTEMS:  Review of Systems  Constitutional: Negative for chills, fever, malaise/fatigue and weight loss.  HENT: Negative for ear pain, hearing loss and tinnitus.   Eyes: Negative for blurred vision, double vision, pain and redness.  Respiratory: Negative for cough, hemoptysis and shortness of breath.   Cardiovascular: Negative for chest pain, palpitations, orthopnea and leg swelling.  Gastrointestinal: Positive for abdominal pain. Negative for constipation, diarrhea, nausea and vomiting.  Genitourinary: Positive for dysuria and flank pain. Negative for frequency and hematuria.  Musculoskeletal: Negative for back pain, joint pain and neck pain.  Skin:  No acne, rash, or lesions  Neurological: Negative for dizziness, tremors, focal weakness and weakness.  Endo/Heme/Allergies: Negative for polydipsia. Does not bruise/bleed easily.  Psychiatric/Behavioral: Negative for depression. The patient is not nervous/anxious and does not have insomnia.      VITAL SIGNS:   Vitals:   04/15/16 2059 04/15/16 2200 04/15/16 2203 04/16/16 0024  BP: 111/67 97/64    Pulse: (!) 126 (!) 114 (!) 119    Resp: 19 14 14    Temp: 99.8 F (37.7 C)   99.3 F (37.4 C)  TempSrc:    Oral  SpO2:  97% (!) 87%   Weight:      Height:       Wt Readings from Last 3 Encounters:  04/15/16 51 kg (112 lb 7 oz)  08/09/15 66.7 kg (147 lb)    PHYSICAL EXAMINATION:  Physical Exam  Vitals reviewed. Constitutional: She is oriented to person, place, and time. She appears well-developed and well-nourished. No distress.  HENT:  Head: Normocephalic and atraumatic.  Mouth/Throat: Oropharynx is clear and moist.  Eyes: Conjunctivae and EOM are normal. Pupils are equal, round, and reactive to light. No scleral icterus.  Neck: Normal range of motion. Neck supple. No JVD present. No thyromegaly present.  Cardiovascular: Normal rate, regular rhythm and intact distal pulses.  Exam reveals no gallop and no friction rub.   No murmur heard. Respiratory: Effort normal and breath sounds normal. No respiratory distress. She has no wheezes. She has no rales.  GI: Soft. Bowel sounds are normal. She exhibits no distension. There is tenderness.  Musculoskeletal: Normal range of motion. She exhibits tenderness (right flank). She exhibits no edema.  No arthritis, no gout  Lymphadenopathy:    She has no cervical adenopathy.  Neurological: She is alert and oriented to person, place, and time. No cranial nerve deficit.  No dysarthria, no aphasia  Skin: Skin is warm and dry. No rash noted. No erythema.  Psychiatric: She has a normal mood and affect. Her behavior is normal. Judgment and thought content normal.    LABORATORY PANEL:   CBC  Recent Labs Lab 04/15/16 2102  WBC 18.9*  HGB 14.9  HCT 42.4  PLT 232   ------------------------------------------------------------------------------------------------------------------  Chemistries   Recent Labs Lab 04/15/16 2102  NA 130*  K 3.0*  CL 93*  CO2 26  GLUCOSE 118*  BUN 10  CREATININE 0.98  CALCIUM 9.3  AST 23  ALT 14  ALKPHOS 92  BILITOT 1.0    ------------------------------------------------------------------------------------------------------------------  Cardiac Enzymes No results for input(s): TROPONINI in the last 168 hours. ------------------------------------------------------------------------------------------------------------------  RADIOLOGY:  Ct Renal Stone Study  Result Date: 04/15/2016 CLINICAL DATA:  Diffuse abdominal pain for 3 days. Vomiting. Right flank pain. EXAM: CT ABDOMEN AND PELVIS WITHOUT CONTRAST TECHNIQUE: Multidetector CT imaging of the abdomen and pelvis was performed following the standard protocol without IV contrast. COMPARISON:  12/18/2012 FINDINGS: Lower chest: Emphysematous changes in the lung bases. Bilateral breast implants. Hepatobiliary: Unenhanced appearance is unremarkable. Pancreas: Unenhanced appearance is unremarkable. Spleen: Unenhanced appearance is unremarkable. Adrenals/Urinary Tract: No adrenal gland nodules. Bilateral intrarenal stones, largest is in the left lower pole and measures 5 x 12 mm. 5 mm stone in the right proximal ureter at the ureteropelvic junction. There is proximal hydronephrosis with distal ureteral decompression. Right kidney is larger than the left and there is stranding around the right kidney. Left kidney demonstrates chronic areas of scarring. Bladder wall is not thickened and no bladder stones are seen. Stomach/Bowel: Stomach, small bowel,  and colon are not abnormally distended. Gas and stool seen throughout the colon. Appendix is not identified. Vascular/Lymphatic: Calcification of the aorta. Retroperitoneal lymphadenopathy with periaortic and pericaval nodes measuring up to about 18 mm diameter. This appearance is similar to the previous study and is nonspecific. Based on long-term stability, this would suggest a benign process. Inflammatory process or lymphoproliferative disorder not entirely excluded. Reproductive: Status post hysterectomy. No adnexal masses. Other:  No abdominal wall hernia or abnormality. No abdominopelvic ascites. Musculoskeletal: No acute or significant osseous findings. IMPRESSION: 5 mm stone in the right proximal ureter at the ureteropelvic junction with moderate proximal obstruction. Additional nonobstructing intrarenal stones bilaterally. Nonspecific retroperitoneal lymphadenopathy is unchanged since previous study. Electronically Signed   By: Burman Nieves M.D.   On: 04/15/2016 22:09    EKG:   Orders placed or performed during the hospital encounter of 04/15/16  . EKG 12-Lead  . EKG 12-Lead    IMPRESSION AND PLAN:  Principal Problem:   Kidney stone - when necessary pain control and when necessary antiemetics tonight. IV fluids, Flomax, urology consult.  All the records are reviewed and case discussed with ED provider. Management plans discussed with the patient and/or family.  DVT PROPHYLAXIS: SubQ lovenox  GI PROPHYLAXIS: None  ADMISSION STATUS: Observation  CODE STATUS: Full Code Status History    This patient does not have a recorded code status. Please follow your organizational policy for patients in this situation.      TOTAL TIME TAKING CARE OF THIS PATIENT: 40 minutes.    Daymian Lill FIELDING 04/16/2016, 12:27 AM  [facility]  Office  [phone]  CC: Primary care physician; Sandrea Hughs, NP

## 2016-04-16 NOTE — OR Nursing (Signed)
Patient to bathroom shortly after arrival back to room from PACU pt. Voided moderate amount of urine yellow in color

## 2016-04-16 NOTE — Anesthesia Preprocedure Evaluation (Addendum)
Anesthesia Evaluation  Patient identified by MRN, date of birth, ID band Patient awake    Reviewed: Allergy & Precautions, NPO status , Patient's Chart, lab work & pertinent test results  Airway Mallampati: II  TM Distance: >3 FB     Dental  (+) Caps   Pulmonary Current Smoker,    Pulmonary exam normal        Cardiovascular negative cardio ROS Normal cardiovascular exam     Neuro/Psych negative neurological ROS  negative psych ROS   GI/Hepatic negative GI ROS, Neg liver ROS,   Endo/Other  negative endocrine ROS  Renal/GU Renal diseasestone  negative genitourinary   Musculoskeletal negative musculoskeletal ROS (+)   Abdominal Normal abdominal exam  (+)   Peds negative pediatric ROS (+)  Hematology negative hematology ROS (+)   Anesthesia Other Findings   Reproductive/Obstetrics                            Anesthesia Physical Anesthesia Plan  ASA: II and emergent  Anesthesia Plan: General   Post-op Pain Management:    Induction: Intravenous  Airway Management Planned:   Additional Equipment:   Intra-op Plan:   Post-operative Plan:   Informed Consent: I have reviewed the patients History and Physical, chart, labs and discussed the procedure including the risks, benefits and alternatives for the proposed anesthesia with the patient or authorized representative who has indicated his/her understanding and acceptance.   Dental advisory given  Plan Discussed with: CRNA and Surgeon  Anesthesia Plan Comments:         Anesthesia Quick Evaluation

## 2016-04-16 NOTE — Interval H&P Note (Signed)
History and Physical Interval Note:  04/16/2016 10:48 AM  Teresa Davenport  has presented today for surgery, with the diagnosis of 5mm stone at ureteropelvic junction  The various methods of treatment have been discussed with the patient and family. After consideration of risks, benefits and other options for treatment, the patient has consented to  Procedure(s): CYSTOSCOPY WITH STENT PLACEMENT (N/A) as a surgical intervention .  The patient's history has been reviewed, patient examined, no change in status, stable for surgery.  I have reviewed the patient's chart and labs.  Questions were answered to the patient's satisfaction.     Zonia Kiefaniel Wollin

## 2016-04-16 NOTE — Transfer of Care (Signed)
Immediate Anesthesia Transfer of Care Note  Patient: Teresa Davenport  Procedure(s) Performed: Procedure(s): CYSTOSCOPY WITH STENT PLACEMENT and retrograde pyelogram (N/A)  Patient Location: PACU  Anesthesia Type:General  Level of Consciousness: awake, alert  and oriented  Airway & Oxygen Therapy: Patient Spontanous Breathing  Post-op Assessment: Report given to RN and Post -op Vital signs reviewed and stable  Post vital signs: Reviewed and stable  Last Vitals:  Vitals:   04/16/16 0244 04/16/16 0402  BP:  (!) 90/48  Pulse:  (!) 103  Resp:  16  Temp: (!) 38.4 C 37.2 C    Last Pain:  Vitals:   04/16/16 0748  TempSrc:   PainSc: 10-Worst pain ever      Patients Stated Pain Goal: 0 (04/16/16 0748)  Complications: No apparent anesthesia complications

## 2016-04-16 NOTE — Progress Notes (Signed)
Md notified of pt c/o pain unrelieved with dilaudid PRN and Percocet PRN. Orders for .5mg  dilaudid x 1 now. Will continue to monitor.

## 2016-04-16 NOTE — H&P (View-Only) (Signed)
History and Physical Examination ° °Date: 04/16/2016 ° °Patient name: Teresa Davenport Medical record number: [id number] °Date of birth: 11/05/1965 Age: 50 y.o. Gender: female °PCP: Rubio, Jessica, NP ° °Chief Complaint:  °Chief Complaint  °Patient presents with  °• Abdominal Pain  °  ° °History of Present Illness: °Teresa Davenport is an 50 y.o. female with history of nephrolithiasis. She was found at OSH to have right sided obstructing proximal stone about one month prior but never followed up with urologist. Since that time she has had continued fevers and pain, presented to ER overnight last night. She was admitted for pain control and spiked to >101 around 2am. ° °Urology was consulted at 8am this morning. Patient is in pain (R flank pain radiating through abdomen) and notes nausea/subjective fevers/chills. Has not taken PO in a few days per patient. Given a dose of ceftriaxone in ER. ° °Past Medical History °Past Medical History:  °Diagnosis Date  °• Kidney stone   °• Pancreatitis   °• Renal disorder   °• Rib fracture   ° ° °Past Surgical History °Past Surgical History:  °Procedure Laterality Date  °• ABDOMINAL HYSTERECTOMY    °• BREAST SURGERY    ° ° °Home Meds: °Prior to Admission medications   °Medication Sig Start Date End Date Taking? Authorizing Provider  °oxyCODONE-acetaminophen (ROXICET) 5-325 MG tablet Take 1 tablet by mouth every 6 (six) hours as needed. °Patient taking differently: Take 1-2 tablets by mouth every 6 (six) hours as needed for moderate pain or severe pain.  08/10/15  Yes Allison P Webster, MD  °lidocaine (LIDODERM) 5 % Place 1 patch onto the skin every 12 (twelve) hours. Remove & Discard patch within 12 hours or as directed by MD °Patient not taking: Reported on 04/15/2016 08/10/15 08/09/16  Allison P Webster, MD  ° ° °Allergies: °Doxycycline; Motrin [ibuprofen]; Penicillins; Toradol [ketorolac tromethamine]; and Tramadol ° °Social History:  °Social History  ° °Social History  °•  Marital status: Married  °  Spouse name: N/A  °• Number of children: N/A  °• Years of education: N/A  ° °Occupational History  °• Not on file.  ° °Social History Main Topics  °• Smoking status: Current Every Day Smoker  °  Packs/day: 2.00  °  Types: Cigarettes  °• Smokeless tobacco: Never Used  °• Alcohol use No  °• Drug use: No  °• Sexual activity: Not on file  ° °Other Topics Concern  °• Not on file  ° °Social History Narrative  °• No narrative on file  ° °Family History:  °Family History  °Problem Relation Age of Onset  °• Kidney disease Mother   °• Pancreatic cancer Father   ° ° °Review of Systems: °Pertinent items are noted in HPI. °All other systems reviewed and reported as negative.  ° °Physical Exam: °Blood pressure (!) 90/48, pulse (!) 103, temperature 98.9 °F (37.2 °C), temperature source Oral, resp. rate 16, height 5' 2" (1.575 m), weight 51 kg (112 lb 7 oz), SpO2 98 %. °BP (!) 90/48 (BP Location: Left Arm)    Pulse (!) 103    Temp 98.9 °F (37.2 °C) (Oral)    Resp 16    Ht 5' 2" (1.575 m)    Wt 51 kg (112 lb 7 oz)    SpO2 98%    BMI 20.56 kg/m²  °General appearance: alert, cooperative and moderate distress °Head: Normocephalic, without obvious abnormality, atraumatic °Neck: no adenopathy, no carotid bruit, no JVD, supple, symmetrical, trachea midline   and thyroid not enlarged, symmetric, no tenderness/mass/nodules °Back: R CVAT °Lungs: normal percussion bilaterally °Heart: tachycardic °Abdomen: soft, non-tender; bowel sounds normal; no masses,  no organomegaly °Extremities: extremities normal, atraumatic, no cyanosis or edema °Skin: Skin color, texture, turgor normal. No rashes or lesions °Neurologic: Grossly normal ° °Lab  And Imaging results:  °Results for orders placed or performed during the hospital encounter of 04/15/16 (from the past 24 hour(s))  °Lipase, blood     Status: None  ° Collection Time: 04/15/16  9:02 PM  °Result Value Ref Range  ° Lipase 12 11 - 51 U/L  °Comprehensive metabolic panel      Status: Abnormal  ° Collection Time: 04/15/16  9:02 PM  °Result Value Ref Range  ° Sodium 130 (L) 135 - 145 mmol/L  ° Potassium 3.0 (L) 3.5 - 5.1 mmol/L  ° Chloride 93 (L) 101 - 111 mmol/L  ° CO2 26 22 - 32 mmol/L  ° Glucose, Bld 118 (H) 65 - 99 mg/dL  ° BUN 10 6 - 20 mg/dL  ° Creatinine, Ser 0.98 0.44 - 1.00 mg/dL  ° Calcium 9.3 8.9 - 10.3 mg/dL  ° Total Protein 8.0 6.5 - 8.1 g/dL  ° Albumin 4.1 3.5 - 5.0 g/dL  ° AST 23 15 - 41 U/L  ° ALT 14 14 - 54 U/L  ° Alkaline Phosphatase 92 38 - 126 U/L  ° Total Bilirubin 1.0 0.3 - 1.2 mg/dL  ° GFR calc non Af Amer >60 >60 mL/min  ° GFR calc Af Amer >60 >60 mL/min  ° Anion gap 11 5 - 15  °CBC     Status: Abnormal  ° Collection Time: 04/15/16  9:02 PM  °Result Value Ref Range  ° WBC 18.9 (H) 3.6 - 11.0 K/uL  ° RBC 4.70 3.80 - 5.20 MIL/uL  ° Hemoglobin 14.9 12.0 - 16.0 g/dL  ° HCT 42.4 35.0 - 47.0 %  ° MCV 90.1 80.0 - 100.0 fL  ° MCH 31.7 26.0 - 34.0 pg  ° MCHC 35.2 32.0 - 36.0 g/dL  ° RDW 13.2 11.5 - 14.5 %  ° Platelets 232 150 - 440 K/uL  °Urinalysis complete, with microscopic     Status: Abnormal  ° Collection Time: 04/15/16 10:34 PM  °Result Value Ref Range  ° Color, Urine YELLOW (A) YELLOW  ° APPearance HAZY (A) CLEAR  ° Glucose, UA NEGATIVE NEGATIVE mg/dL  ° Bilirubin Urine NEGATIVE NEGATIVE  ° Ketones, ur NEGATIVE NEGATIVE mg/dL  ° Specific Gravity, Urine 1.008 1.005 - 1.030  ° Hgb urine dipstick 3+ (A) NEGATIVE  ° pH 6.0 5.0 - 8.0  ° Protein, ur 30 (A) NEGATIVE mg/dL  ° Nitrite NEGATIVE NEGATIVE  ° Leukocytes, UA 1+ (A) NEGATIVE  ° RBC / HPF TOO NUMEROUS TO COUNT 0 - 5 RBC/hpf  ° WBC, UA 6-30 0 - 5 WBC/hpf  ° Bacteria, UA RARE (A) NONE SEEN  ° Squamous Epithelial / LPF 0-5 (A) NONE SEEN  ° Mucous PRESENT   °Basic metabolic panel     Status: Abnormal  ° Collection Time: 04/16/16  5:19 AM  °Result Value Ref Range  ° Sodium 133 (L) 135 - 145 mmol/L  ° Potassium 2.5 (LL) 3.5 - 5.1 mmol/L  ° Chloride 100 (L) 101 - 111 mmol/L  ° CO2 25 22 - 32 mmol/L  ° Glucose, Bld 101 (H)  65 - 99 mg/dL  ° BUN 8 6 - 20 mg/dL  ° Creatinine, Ser 0.75 0.44 - 1.00 mg/dL  °   Calcium 8.4 (L) 8.9 - 10.3 mg/dL  ° GFR calc non Af Amer >60 >60 mL/min  ° GFR calc Af Amer >60 >60 mL/min  ° Anion gap 8 5 - 15  °CBC     Status: Abnormal  ° Collection Time: 04/16/16  5:19 AM  °Result Value Ref Range  ° WBC 15.8 (H) 3.6 - 11.0 K/uL  ° RBC 3.80 3.80 - 5.20 MIL/uL  ° Hemoglobin 12.0 12.0 - 16.0 g/dL  ° HCT 34.7 (L) 35.0 - 47.0 %  ° MCV 91.3 80.0 - 100.0 fL  ° MCH 31.5 26.0 - 34.0 pg  ° MCHC 34.5 32.0 - 36.0 g/dL  ° RDW 13.4 11.5 - 14.5 %  ° Platelets 199 150 - 440 K/uL  ° °Imaging ° °CT stone protocol 04/15/16: °5 mm stone in the right proximal ureter at the ureteropelvic °junction with moderate proximal obstruction. Additional °nonobstructing intrarenal stones bilaterally. Nonspecific °retroperitoneal lymphadenopathy is unchanged since previous study. ° °Impression ° °Principal Problem: °  Kidney stone ° 50yo F with history of stones, right proximal obstructing ureteral stone with fevers/elevated WBC, concern for developing urosepsis ° °Plan  °To OR for cystoscopy, right ureteral stent placement to deobstruct kidney °Will continue ceftriaxone and narrow to PO antibiotics per culture results °Will require 2 weeks of antibiotics prior to outpatient stone treatment, patient may followup postoperatively with outpatient urologist ° °Daniel Wollin MD ° °

## 2016-04-16 NOTE — Anesthesia Procedure Notes (Signed)
Procedure Name: Intubation Date/Time: 04/16/2016 1:36 PM Performed by: Clovis FredricksonRISSON, Philicia Heyne Pre-anesthesia Checklist: Patient identified, Emergency Drugs available, Suction available, Patient being monitored and Timeout performed Patient Re-evaluated:Patient Re-evaluated prior to inductionOxygen Delivery Method: Circle system utilized Preoxygenation: Pre-oxygenation with 100% oxygen Intubation Type: IV induction and Cricoid Pressure applied Ventilation: Mask ventilation without difficulty Laryngoscope Size: Mac and 4 Grade View: Grade I Tube type: Oral Tube size: 7.0 mm Number of attempts: 1 Airway Equipment and Method: Stylet Placement Confirmation: ETT inserted through vocal cords under direct vision,  positive ETCO2,  CO2 detector and breath sounds checked- equal and bilateral Secured at: 22 cm Dental Injury: Teeth and Oropharynx as per pre-operative assessment

## 2016-04-17 DIAGNOSIS — N12 Tubulo-interstitial nephritis, not specified as acute or chronic: Secondary | ICD-10-CM

## 2016-04-17 HISTORY — DX: Tubulo-interstitial nephritis, not specified as acute or chronic: N12

## 2016-04-17 LAB — BASIC METABOLIC PANEL
ANION GAP: 7 (ref 5–15)
BUN: 11 mg/dL (ref 6–20)
CHLORIDE: 108 mmol/L (ref 101–111)
CO2: 24 mmol/L (ref 22–32)
Calcium: 9 mg/dL (ref 8.9–10.3)
Creatinine, Ser: 0.67 mg/dL (ref 0.44–1.00)
GFR calc non Af Amer: >60 mL/min (ref >60)
GLUCOSE: 141 mg/dL — AB (ref 65–99)
POTASSIUM: 4.3 mmol/L (ref 3.5–5.1)
Sodium: 139 mmol/L (ref 135–145)

## 2016-04-17 LAB — CBC
HEMATOCRIT: 32.4 % — AB (ref 35.0–47.0)
HEMOGLOBIN: 11.4 g/dL — AB (ref 12.0–16.0)
MCH: 32.2 pg (ref 26.0–34.0)
MCHC: 35 g/dL (ref 32.0–36.0)
MCV: 91.8 fL (ref 80.0–100.0)
Platelets: 188 10*3/uL (ref 150–440)
RBC: 3.53 MIL/uL — ABNORMAL LOW (ref 3.80–5.20)
RDW: 13.5 % (ref 11.5–14.5)
WBC: 9.9 10*3/uL (ref 3.6–11.0)

## 2016-04-17 LAB — URINE CULTURE: Culture: NO GROWTH

## 2016-04-17 MED ORDER — OXYCODONE-ACETAMINOPHEN 5-325 MG PO TABS
1.0000 | ORAL_TABLET | ORAL | Status: DC | PRN
  Administered 2016-04-17 – 2016-04-18 (×3): 2 via ORAL
  Filled 2016-04-17 (×4): qty 2

## 2016-04-17 MED ORDER — ENOXAPARIN SODIUM 40 MG/0.4ML ~~LOC~~ SOLN
40.0000 mg | SUBCUTANEOUS | Status: DC
  Administered 2016-04-17 – 2016-04-18 (×2): 40 mg via SUBCUTANEOUS
  Filled 2016-04-17 (×2): qty 0.4

## 2016-04-17 NOTE — Anesthesia Postprocedure Evaluation (Signed)
Anesthesia Post Note  Patient: Teresa Davenport  Procedure(s) Performed: Procedure(s) (LRB): CYSTOSCOPY WITH STENT PLACEMENT and retrograde pyelogram (N/A)  Patient location during evaluation: PACU Anesthesia Type: General Level of consciousness: awake and alert and oriented Pain management: pain level controlled Vital Signs Assessment: post-procedure vital signs reviewed and stable Respiratory status: spontaneous breathing Cardiovascular status: blood pressure returned to baseline Anesthetic complications: no    Last Vitals:  Vitals:   04/16/16 2046 04/17/16 0634  BP: (!) 88/51 (!) 99/48  Pulse: 85 68  Resp: 20 16  Temp:  36.4 C    Last Pain:  Vitals:   04/17/16 0741  TempSrc:   PainSc: 10-Worst pain ever                 Davielle Lingelbach

## 2016-04-17 NOTE — Progress Notes (Signed)
Initial Nutrition Assessment  DOCUMENTATION CODES:   Not applicable  INTERVENTION:  -Ensure Enlive po BID, each supplement provides 350 kcal and 20 grams of protein  NUTRITION DIAGNOSIS:   Inadequate oral intake related to poor appetite as evidenced by per patient/family report.  GOAL:   Patient will meet greater than or equal to 90% of their needs  MONITOR:   PO intake, I & O's, Labs, Weight trends, Supplement acceptance  REASON FOR ASSESSMENT:   Malnutrition Screening Tool    ASSESSMENT:   Teresa Davenport  is a 50 y.o. female who presents with Lower abdominal and flank pain. Patient has a known history of kidney stones and states that she's been having symptoms from her known kidney stone for the past several weeks. Her last 3 days they have gotten significantly worse. She came to the ED for evaluation was found to have a 5 mm stone at the ureteropelvic junction.  Pt was not in room for multiple attempts at visit. Per chart review she exhibits a 35#/23% severe wt loss PO intake 100% thus far. Had a uretal stent and retrograde pyelogram this morning. Unable to complete Nutrition-Focused physical exam at this time.  Labs and medications reviewed: NS w/ K+ @ 4375mL/hr  Diet Order:  Diet regular Room service appropriate? Yes; Fluid consistency: Thin  Skin:  Reviewed, no issues  Last BM:  04/15/2016  Height:   Ht Readings from Last 1 Encounters:  04/15/16 5\' 2"  (1.575 m)    Weight:   Wt Readings from Last 1 Encounters:  04/15/16 112 lb 7 oz (51 kg)    Ideal Body Weight:  50 kg  BMI:  Body mass index is 20.56 kg/m.  Estimated Nutritional Needs:   Kcal:  1500-1800 calories  Protein:  50-62 gm  Fluid:  >/= 1.5L  EDUCATION NEEDS:   No education needs identified at this time  Dionne AnoWilliam M. Yannis Gumbs, MS, RD LDN Inpatient Clinical Dietitian Pager [phone]

## 2016-04-17 NOTE — Progress Notes (Signed)
[facility] [place] at [facility]   PATIENT NAME: Teresa Davenport    MR#:  [id number]  DATE OF BIRTH:  02/16/1966  SUBJECTIVE:  CHIEF COMPLAINT:   Chief Complaint  Patient presents with  . Abdominal Pain   -right ureteral stent placed yesterday - no fevers last night, wbc normalized - still complains of 10/10 abdominal pain- low pain tolerance  REVIEW OF SYSTEMS:  Review of Systems  Constitutional: Negative for chills, fever and malaise/fatigue.  HENT: Negative for ear discharge and ear pain.   Respiratory: Negative for cough, shortness of breath and wheezing.   Cardiovascular: Negative for chest pain, palpitations and leg swelling.  Gastrointestinal: Positive for abdominal pain. Negative for constipation, diarrhea, nausea and vomiting.  Genitourinary: Negative for dysuria and urgency.  Musculoskeletal: Positive for back pain.  Neurological: Negative for dizziness, sensory change, speech change, focal weakness, seizures and headaches.  Psychiatric/Behavioral: Negative for depression.    DRUG ALLERGIES:   Allergies  Allergen Reactions  . Doxycycline Swelling  . Motrin [Ibuprofen] Swelling  . Penicillins Swelling    Has patient had a PCN reaction causing immediate rash, facial/tongue/throat swelling, SOB or lightheadedness with hypotension: Yes Has patient had a PCN reaction causing severe rash involving mucus membranes or skin necrosis: No Has patient had a PCN reaction that required hospitalization No Has patient had a PCN reaction occurring within the last 10 years: No If all of the above answers are "NO", then may proceed with Cephalosporin use.  . Tape     Patient says she cannot tolerate any tape but tegaderm  . Toradol [Ketorolac Tromethamine] Swelling  . Tramadol Swelling    VITALS:  Blood pressure (!) 99/48, pulse 68, temperature 97.6 F (36.4 C), temperature source Oral, resp. rate 16, height 5\' 2"  (1.575 m), weight 51 kg (112 lb 7  oz), SpO2 99 %.  PHYSICAL EXAMINATION:  Physical Exam  GENERAL:  50 y.o.-year-old patient lying in the bed, not in acute distress.  EYES: Pupils equal, round, reactive to light and accommodation. No scleral icterus. Extraocular muscles intact.  HEENT: Head atraumatic, normocephalic. Oropharynx and nasopharynx clear.  NECK:  Supple, no jugular venous distention. No thyroid enlargement, no tenderness.  LUNGS: Normal breath sounds bilaterally, no wheezing, rales,rhonchi or crepitation. No use of accessory muscles of respiration.  CARDIOVASCULAR: S1, S2 normal. No murmurs, rubs, or gallops.  ABDOMEN: positive for abdominal tenderness all over, no rigidity or rebound tenderness, no signs of peritonitis. Bowel sounds present. No organomegaly or mass.  EXTREMITIES: No pedal edema, cyanosis, or clubbing.  NEUROLOGIC: Cranial nerves II through XII are intact. Muscle strength 5/5 in all extremities. Sensation intact. Gait not checked.  PSYCHIATRIC: The patient is alert and oriented x 3. SKIN: No obvious rash, lesion, or ulcer.    LABORATORY PANEL:   CBC  Recent Labs Lab 04/17/16 0539  WBC 9.9  HGB 11.4*  HCT 32.4*  PLT 188   ------------------------------------------------------------------------------------------------------------------  Chemistries   Recent Labs Lab 04/15/16 2102  04/17/16 0539  NA 130*  < > 139  K 3.0*  < > 4.3  CL 93*  < > 108  CO2 26  < > 24  GLUCOSE 118*  < > 141*  BUN 10  < > 11  CREATININE 0.98  < > 0.67  CALCIUM 9.3  < > 9.0  AST 23  --   --   ALT 14  --   --   ALKPHOS 92  --   --  BILITOT 1.0  --   --   < > = values in this interval not displayed. ------------------------------------------------------------------------------------------------------------------  Cardiac Enzymes No results for input(s): TROPONINI in the last 168  hours. ------------------------------------------------------------------------------------------------------------------  RADIOLOGY:  Dg Hand Complete Right  Result Date: 04/16/2016 CLINICAL DATA:  Pt stated 30 minutes ago the large IV unit (Box) fell onto her right hand. Pain at the base of 2nc and 3rd metacarpal. No previous. EXAM: RIGHT HAND - COMPLETE 3+ VIEW COMPARISON:  None. FINDINGS: There is no evidence of fracture or dislocation. There is no evidence of arthropathy or other focal bone abnormality. Soft tissues are unremarkable. IMPRESSION: Negative. Electronically Signed   By: Amie Portland M.D.   On: 04/16/2016 16:44   Ct Renal Stone Study  Result Date: 04/15/2016 CLINICAL DATA:  Diffuse abdominal pain for 3 days. Vomiting. Right flank pain. EXAM: CT ABDOMEN AND PELVIS WITHOUT CONTRAST TECHNIQUE: Multidetector CT imaging of the abdomen and pelvis was performed following the standard protocol without IV contrast. COMPARISON:  12/18/2012 FINDINGS: Lower chest: Emphysematous changes in the lung bases. Bilateral breast implants. Hepatobiliary: Unenhanced appearance is unremarkable. Pancreas: Unenhanced appearance is unremarkable. Spleen: Unenhanced appearance is unremarkable. Adrenals/Urinary Tract: No adrenal gland nodules. Bilateral intrarenal stones, largest is in the left lower pole and measures 5 x 12 mm. 5 mm stone in the right proximal ureter at the ureteropelvic junction. There is proximal hydronephrosis with distal ureteral decompression. Right kidney is larger than the left and there is stranding around the right kidney. Left kidney demonstrates chronic areas of scarring. Bladder wall is not thickened and no bladder stones are seen. Stomach/Bowel: Stomach, small bowel, and colon are not abnormally distended. Gas and stool seen throughout the colon. Appendix is not identified. Vascular/Lymphatic: Calcification of the aorta. Retroperitoneal lymphadenopathy with periaortic and pericaval  nodes measuring up to about 18 mm diameter. This appearance is similar to the previous study and is nonspecific. Based on long-term stability, this would suggest a benign process. Inflammatory process or lymphoproliferative disorder not entirely excluded. Reproductive: Status post hysterectomy. No adnexal masses. Other: No abdominal wall hernia or abnormality. No abdominopelvic ascites. Musculoskeletal: No acute or significant osseous findings. IMPRESSION: 5 mm stone in the right proximal ureter at the ureteropelvic junction with moderate proximal obstruction. Additional nonobstructing intrarenal stones bilaterally. Nonspecific retroperitoneal lymphadenopathy is unchanged since previous study. Electronically Signed   By: Burman Nieves M.D.   On: 04/15/2016 22:09    EKG:   Orders placed or performed during the hospital encounter of 04/15/16  . EKG 12-Lead  . EKG 12-Lead    ASSESSMENT AND PLAN:   50 y/o F with PMH significant for rib fractures, chronic pain, renal stones in past admitted for severe right flank pain and fevers  #1 Sepsis- with fever, wbc- from acute pyelonephritis - f/u urine cultures and resolved, no fevers, wbc normalized - sepsis - agree with IV ABX- rocephin  #2 Right renal stone- proximal ureter, s/p right ureteral stent placement yesterday  -appreciate urology consult  - flomax added -Continue IV fluids and pain medications   #3 Hypokalemia- improved after replacing  #4 DVT Prophylaxis- restart lovenox   Discussed with patient. managing her pain today. Anticipate discharge tomorrow.    All the records are reviewed and case discuss is a one daily ed with Care Burna Cash he is Environmental consultant. Management plans discussed with the patient, family and they are in agreement.  CODE STATUS: Full Code  TOTAL TIME TAKING CARE OF THIS PATIENT: 37 minutes.  POSSIBLE D/C TOMORROW, DEPENDING ON CLINICAL CONDITION.   Enid Baas M.D on 04/17/2016 at 9:23  AM  Between 7am to 6pm - Pager - [phone]  After 6pm go to [email or web address] - Social research officer, government  [facility]  Office  [phone]  CC: Primary care physician; Sandrea Hughs, NP

## 2016-04-18 ENCOUNTER — Encounter: Payer: Self-pay | Admitting: Urology

## 2016-04-18 ENCOUNTER — Telehealth: Payer: Self-pay

## 2016-04-18 LAB — BASIC METABOLIC PANEL
Anion gap: 6 (ref 5–15)
BUN: 8 mg/dL (ref 6–20)
CALCIUM: 8.7 mg/dL — AB (ref 8.9–10.3)
CO2: 25 mmol/L (ref 22–32)
CREATININE: 0.65 mg/dL (ref 0.44–1.00)
Chloride: 108 mmol/L (ref 101–111)
Glucose, Bld: 125 mg/dL — ABNORMAL HIGH (ref 65–99)
Potassium: 3.6 mmol/L (ref 3.5–5.1)
SODIUM: 139 mmol/L (ref 135–145)

## 2016-04-18 LAB — URINALYSIS COMPLETE WITH MICROSCOPIC (ARMC ONLY)
Bacteria, UA: NONE SEEN
Bilirubin Urine: NEGATIVE
GLUCOSE, UA: NEGATIVE mg/dL
KETONES UR: NEGATIVE mg/dL
NITRITE: NEGATIVE
PROTEIN: 100 mg/dL — AB
SPECIFIC GRAVITY, URINE: 1.011 (ref 1.005–1.030)
pH: 6 (ref 5.0–8.0)

## 2016-04-18 LAB — URINE CULTURE

## 2016-04-18 MED ORDER — TAMSULOSIN HCL 0.4 MG PO CAPS: 0.4000 mg | ORAL_CAPSULE | Freq: Every day | ORAL | 2 refills | Status: DC

## 2016-04-18 MED ORDER — ONDANSETRON 4 MG PO TBDP: 4.0000 mg | ORAL_TABLET | Freq: Three times a day (TID) | ORAL | 0 refills | Status: DC | PRN

## 2016-04-18 MED ORDER — LEVOFLOXACIN 500 MG PO TABS: 500.0000 mg | ORAL_TABLET | Freq: Every day | ORAL | 0 refills | Status: DC

## 2016-04-18 MED ORDER — LEVOFLOXACIN 500 MG PO TABS
500.0000 mg | ORAL_TABLET | Freq: Every day | ORAL | Status: DC
  Administered 2016-04-18: 500 mg via ORAL
  Filled 2016-04-18: qty 1

## 2016-04-18 MED ORDER — OXYCODONE-ACETAMINOPHEN 5-325 MG PO TABS: 1.0000 | ORAL_TABLET | Freq: Four times a day (QID) | ORAL | 0 refills | Status: DC | PRN

## 2016-04-18 NOTE — Telephone Encounter (Signed)
-----   Message from Jerilee FieldMatthew Eskridge, MD sent at 04/18/2016 10:04 AM EDT ----- This pt was stented over weekend by Dr. Jerrel IvoryWollin -- needs f/u in 1-2 weeks with an MD to discuss ureteroscopy. He may have already sent a f/u request, I was just making sure.

## 2016-04-18 NOTE — Progress Notes (Signed)
Patient discharge teaching given, including activity, diet, follow-up appoints, and medications. Patient verbalized understanding of all discharge instructions. IV access was d/c'd. Vitals are stable. Skin is intact except as charted in most recent assessments. Pt to be escorted out by RN, to be driven home by family.  Maye Parkinson E Hobbs  

## 2016-04-18 NOTE — Discharge Summary (Signed)
[facility] - [place] at [facility]   PATIENT NAME: Teresa Davenport    MR#:  [id number]  DATE OF BIRTH:  01/31/1966  DATE OF ADMISSION:  04/15/2016   ADMITTING PHYSICIAN: Oralia Manis, MD  DATE OF DISCHARGE: 04/18/2016  1:35 PM  PRIMARY CARE PHYSICIAN: Sandrea Hughs, NP   ADMISSION DIAGNOSIS:   Sinus tachycardia [R00.0] Intractable abdominal pain [R10.9]  DISCHARGE DIAGNOSIS:   Principal Problem:   Kidney stone Active Problems:   Pyelonephritis   SECONDARY DIAGNOSIS:   Past Medical History:  Diagnosis Date  . Kidney stone   . Pancreatitis   . Renal disorder   . Rib fracture     HOSPITAL COURSE:   50 y/o F with PMH significant for rib fractures, chronic pain, renal stones in past admitted for severe right flank pain and fevers  #1 Sepsis- with fever, wbc- from acute pyelonephritis -  urine cultures  with mixed organisms, repeat cultures during procedure negative, no fevers, wbc normalized - being discharged on levaquin  #2 Right renal stone- proximal ureter, s/p right ureteral stent placement 04/16/16 -appreciate urology consult  - flomax added -Has significant pain. The pain is also from her chronic pain issues. Hematuria present which is expected after the procedure according to urology. Discussed with urology prior to discharge.   #3 Hypokalemia- improved after replacing  Discharge today with outpatient urology follow-up   DISCHARGE CONDITIONS:   Guarded  CONSULTS OBTAINED:   Treatment Team:  Enid Baas, MD Zonia Kief, MD  DRUG ALLERGIES:   Allergies  Allergen Reactions  . Doxycycline Swelling  . Motrin [Ibuprofen] Swelling  . Penicillins Swelling    Has patient had a PCN reaction causing immediate rash, facial/tongue/throat swelling, SOB or lightheadedness with hypotension: Yes Has patient had a PCN reaction causing severe rash involving mucus membranes or skin necrosis: No Has patient had a PCN reaction  that required hospitalization No Has patient had a PCN reaction occurring within the last 10 years: No If all of the above answers are "NO", then may proceed with Cephalosporin use.  . Tape     Patient says she cannot tolerate any tape but tegaderm  . Toradol [Ketorolac Tromethamine] Swelling  . Tramadol Swelling   DISCHARGE MEDICATIONS:     Medication List    STOP taking these medications   lidocaine 5 % Commonly known as:  LIDODERM     TAKE these medications   levofloxacin 500 MG tablet Commonly known as:  LEVAQUIN Take 1 tablet (500 mg total) by mouth daily. X 7 more days   ondansetron 4 MG disintegrating tablet Commonly known as:  ZOFRAN ODT Take 1 tablet (4 mg total) by mouth every 8 (eight) hours as needed for nausea or vomiting.   oxyCODONE-acetaminophen 5-325 MG tablet Commonly known as:  ROXICET Take 1-2 tablets by mouth every 6 (six) hours as needed for moderate pain or severe pain.   tamsulosin 0.4 MG Caps capsule Commonly known as:  FLOMAX Take 1 capsule (0.4 mg total) by mouth daily.        DISCHARGE INSTRUCTIONS:   1. Urology f/u in 1-2 weeks 2. PCP f/u in 1 week  DIET:   Regular diet  ACTIVITY:   Activity as tolerated  OXYGEN:   Home Oxygen: No.  Oxygen Delivery: room air  DISCHARGE LOCATION:   home   If you experience worsening of your admission symptoms, develop shortness of breath, life threatening emergency, suicidal or homicidal thoughts you must seek medical attention  immediately by calling 911 or calling your MD immediately  if symptoms less severe.  You Must read complete instructions/literature along with all the possible adverse reactions/side effects for all the Medicines you take and that have been prescribed to you. Take any new Medicines after you have completely understood and accpet all the possible adverse reactions/side effects.   Please note  You were cared for by a hospitalist during your hospital stay. If you have  any questions about your discharge medications or the care you received while you were in the hospital after you are discharged, you can call the unit and asked to speak with the hospitalist on call if the hospitalist that took care of you is not available. Once you are discharged, your primary care physician will handle any further medical issues. Please note that NO REFILLS for any discharge medications will be authorized once you are discharged, as it is imperative that you return to your primary care physician (or establish a relationship with a primary care physician if you do not have one) for your aftercare needs so that they can reassess your need for medications and monitor your lab values.    On the day of Discharge:  VITAL SIGNS:   Blood pressure (!) 95/56, pulse 83, temperature 97.8 F (36.6 C), temperature source Oral, resp. rate 18, height 5\' 2"  (1.575 m), weight 51 kg (112 lb 7 oz), SpO2 96 %.  PHYSICAL EXAMINATION:    GENERAL:  50 y.o.-year-old patient lying in the bed, not in acute distress.  EYES: Pupils equal, round, reactive to light and accommodation. No scleral icterus. Extraocular muscles intact.  HEENT: Head atraumatic, normocephalic. Oropharynx and nasopharynx clear.  NECK:  Supple, no jugular venous distention. No thyroid enlargement, no tenderness.  LUNGS: Normal breath sounds bilaterally, no wheezing, rales,rhonchi or crepitation. No use of accessory muscles of respiration.  CARDIOVASCULAR: S1, S2 normal. No murmurs, rubs, or gallops.  ABDOMEN: positive for abdominal tenderness all over, no rigidity or rebound tenderness, no signs of peritonitis. Bowel sounds present. No organomegaly or mass.  EXTREMITIES: No pedal edema, cyanosis, or clubbing.  NEUROLOGIC: Cranial nerves II through XII are intact. Muscle strength 5/5 in all extremities. Sensation intact. Gait not checked.  PSYCHIATRIC: The patient is alert and oriented x 3. SKIN: No obvious rash, lesion, or ulcer.     DATA REVIEW:   CBC  Recent Labs Lab 04/17/16 0539  WBC 9.9  HGB 11.4*  HCT 32.4*  PLT 188    Chemistries   Recent Labs Lab 04/15/16 2102  04/18/16 0716  NA 130*  < > 139  K 3.0*  < > 3.6  CL 93*  < > 108  CO2 26  < > 25  GLUCOSE 118*  < > 125*  BUN 10  < > 8  CREATININE 0.98  < > 0.65  CALCIUM 9.3  < > 8.7*  AST 23  --   --   ALT 14  --   --   ALKPHOS 92  --   --   BILITOT 1.0  --   --   < > = values in this interval not displayed.   Microbiology Results  Results for orders placed or performed during the hospital encounter of 04/15/16  Urine culture     Status: Abnormal   Collection Time: 04/15/16 10:34 PM  Result Value Ref Range Status   Specimen Description URINE, RANDOM  Final   Special Requests NONE  Final   Culture MULTIPLE SPECIES  PRESENT, SUGGEST RECOLLECTION (A)  Final   Report Status 04/18/2016 FINAL  Final  MRSA PCR Screening     Status: None   Collection Time: 04/16/16 10:41 AM  Result Value Ref Range Status   MRSA by PCR NEGATIVE NEGATIVE Final    Comment:        The GeneXpert MRSA Assay (FDA approved for NASAL specimens only), is one component of a comprehensive MRSA colonization surveillance program. It is not intended to diagnose MRSA infection nor to guide or monitor treatment for MRSA infections.   Urine culture     Status: None   Collection Time: 04/16/16  1:55 PM  Result Value Ref Range Status   Specimen Description URINE, RANDOM  Final   Special Requests NONE  Final   Culture [facility]OWTH [facility]   Final   Report Status 04/17/2016 FINAL  Final    RADIOLOGY:  No results found.   Management plans discussed with the patient, family and they are in agreement.  CODE STATUS:     Code Status Orders        Start     Ordered   04/16/16 0134  Full code  Continuous     04/16/16 0133    Code Status History    Date Active Date Inactive Code Status Order ID Comments User Context   This patient has  a current code status but no historical code status.      TOTAL TIME TAKING CARE OF THIS PATIENT: 37 minutes.    Square Jowett M.D on 04/18/2016 at 2:02 PM  Between 7am to 6pm - Pager - [phone]  After 6pm go to [email or web address] - Social research officer, governmentpassword [employer] [facility]  [facility] [place] [facility]  Office  [phone]  CC: Primary care physician; Sandrea Hughsubio, Jessica, NP   Note: This dictation was prepared with Dragon dictation along with smaller phrase technology. Any transcriptional errors that result from this process are unintentional.

## 2016-04-18 NOTE — Telephone Encounter (Signed)
done

## 2016-04-29 DIAGNOSIS — N201 Calculus of ureter: Secondary | ICD-10-CM

## 2016-04-29 DIAGNOSIS — N132 Hydronephrosis with renal and ureteral calculous obstruction: Secondary | ICD-10-CM

## 2016-04-29 HISTORY — DX: Calculus of ureter: N20.1

## 2016-04-29 HISTORY — DX: Hydronephrosis with renal and ureteral calculous obstruction: N13.2

## 2016-05-04 ENCOUNTER — Ambulatory Visit (INDEPENDENT_AMBULATORY_CARE_PROVIDER_SITE_OTHER): Payer: Self-pay | Admitting: Urology

## 2016-05-04 ENCOUNTER — Encounter: Payer: Self-pay | Admitting: Urology

## 2016-05-04 VITALS — BP 111/76 | HR 85 | Ht 62.0 in | Wt 120.0 lb

## 2016-05-04 DIAGNOSIS — N200 Calculus of kidney: Secondary | ICD-10-CM

## 2016-05-04 LAB — URINALYSIS, COMPLETE
Bilirubin, UA: NEGATIVE
GLUCOSE, UA: NEGATIVE
Ketones, UA: NEGATIVE
NITRITE UA: NEGATIVE
PH UA: 6 (ref 5.0–7.5)
Specific Gravity, UA: 1.025 (ref 1.005–1.030)
UUROB: 0.2 mg/dL (ref 0.2–1.0)

## 2016-05-04 LAB — MICROSCOPIC EXAMINATION
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
## 2016-05-05 ENCOUNTER — Telehealth: Payer: Self-pay | Admitting: Radiology

## 2016-05-05 ENCOUNTER — Other Ambulatory Visit: Payer: Self-pay | Admitting: Radiology

## 2016-05-05 DIAGNOSIS — N201 Calculus of ureter: Secondary | ICD-10-CM

## 2016-05-05 NOTE — Telephone Encounter (Signed)
Spoke with pt regarding surgery scheduled with Dr Apolinar JunesBrandon. Pt requested surgery be moved up to 05/11/16 with Dr Sherryl BartersBudzyn. Pt aware of change in provider & is in agreement. Notified pt of pre-admit testing phone interview on 05/09/16 between 1-5pm & to call day prior to surgery for arrival time to SDS. Pt voices understanding.

## 2016-05-07 ENCOUNTER — Telehealth: Payer: Self-pay | Admitting: Urology

## 2016-05-07 LAB — CULTURE, URINE COMPREHENSIVE

## 2016-05-07 MED ORDER — CIPROFLOXACIN HCL 500 MG PO TABS: 500.0000 mg | ORAL_TABLET | Freq: Two times a day (BID) | ORAL | 0 refills | Status: DC

## 2016-05-07 NOTE — Telephone Encounter (Signed)
+   preop urine culture.  Called patient to make her aware, message left with her mother to pick up prescription.  Advised to start abx today.  cipro 500 mg bid x 7 days.  Vanna ScotlandAshley Abria Vannostrand, MD

## 2016-05-09 ENCOUNTER — Encounter
Admission: RE | Admit: 2016-05-09 | Discharge: 2016-05-09 | Disposition: A | Discharge status: Home / Self Care | Payer: Self-pay | Source: Ambulatory Visit | Attending: Urology | Admitting: Urology

## 2016-05-09 HISTORY — DX: Personal history of urinary calculi: Z87.442

## 2016-05-09 NOTE — Patient Instructions (Signed)
  Your procedure is scheduled on: 05/11/16 Report to Day Surgery.[facility] SECOND FLOOR To find out your arrival time please call [phone] between 1PM - 3PM on 05/10/16.  Remember: Instructions that are not followed completely may result in serious medical risk, up to and including death, or upon the discretion of your surgeon and anesthesiologist your surgery may need to be rescheduled.    __X__ 1. Do not eat food or drink liquids after midnight. No gum chewing or hard candies.     __X__ 2. No Alcohol for 24 hours before or after surgery.   _X___ 3. Do Not Smoke For 24 Hours Prior to Your Surgery.   ____ 4. Bring all medications with you on the day of surgery if instructed.    __X__ 5. Notify your doctor if there is any change in your medical condition     (cold, fever, infections).       Do not wear jewelry, make-up, hairpins, clips or nail polish.  Do not wear lotions, powders, or perfumes. You may wear deodorant.  Do not shave 48 hours prior to surgery. Men may shave face and neck.  Do not bring valuables to the hospital.    [facility] not [facility] any belongings or valuables.               Contacts, dentures or bridgework may not be worn into surgery.  Leave your suitcase in the car. After surgery it may be brought to your room.  For patients admitted to the hospital, discharge time is determined by your                treatment team.   Patients discharged the day of surgery will not be allowed to drive home.   ____ Take these medicines the morning of surgery with A SIP OF WATER:    1. NONE  2.   3.   4.  5.  6.  ____ Fleet Enema (as directed)   ____ Use CHG Soap as directed  ____ Use inhalers on the day of surgery  ____ Stop metformin 2 days prior to surgery    ____ Take 1/2 of usual insulin dose the night before surgery and none on the morning of surgery.   ____ Stop Coumadin/Plavix/aspirin on  ____ Stop Anti-inflammatories on    ____  Stop supplements until after surgery.    ____ Bring C-Pap to the hospital.

## 2016-05-09 NOTE — Pre-Procedure Instructions (Signed)
EKG FROM 04/15/16 COMPARED WITH EKG 3/17 AND SAME. PATIENT STATED MEDICATIONS INCLUDING NARCOTIC STOLEN. CHRONIC PAIN/ NARCOTIC USE. UDS DAY OF SURGERY ORDERED AS INSTRUCTED BY DR Karlton LemonKARENZ

## 2016-05-11 ENCOUNTER — Ambulatory Visit
Admission: RE | Admit: 2016-05-11 | Discharge: 2016-05-11 | Disposition: A | Discharge status: Home / Self Care | Payer: Self-pay | Source: Ambulatory Visit | Attending: Urology | Admitting: Urology

## 2016-05-11 ENCOUNTER — Ambulatory Visit: Payer: Self-pay | Admitting: Anesthesiology

## 2016-05-11 ENCOUNTER — Encounter
Admission: RE | Disposition: A | Discharge status: Home / Self Care | Payer: Self-pay | Source: Ambulatory Visit | Attending: Urology

## 2016-05-11 ENCOUNTER — Encounter: Payer: Self-pay | Admitting: *Deleted

## 2016-05-11 ENCOUNTER — Telehealth: Payer: Self-pay

## 2016-05-11 DIAGNOSIS — J439 Emphysema, unspecified: Secondary | ICD-10-CM | POA: Insufficient documentation

## 2016-05-11 DIAGNOSIS — N202 Calculus of kidney with calculus of ureter: Secondary | ICD-10-CM | POA: Insufficient documentation

## 2016-05-11 DIAGNOSIS — N3020 Other chronic cystitis without hematuria: Secondary | ICD-10-CM | POA: Insufficient documentation

## 2016-05-11 DIAGNOSIS — Z885 Allergy status to narcotic agent status: Secondary | ICD-10-CM | POA: Insufficient documentation

## 2016-05-11 DIAGNOSIS — N201 Calculus of ureter: Secondary | ICD-10-CM

## 2016-05-11 DIAGNOSIS — Z881 Allergy status to other antibiotic agents status: Secondary | ICD-10-CM | POA: Insufficient documentation

## 2016-05-11 DIAGNOSIS — Z79890 Hormone replacement therapy (postmenopausal): Secondary | ICD-10-CM | POA: Insufficient documentation

## 2016-05-11 DIAGNOSIS — Z8719 Personal history of other diseases of the digestive system: Secondary | ICD-10-CM | POA: Insufficient documentation

## 2016-05-11 DIAGNOSIS — G8929 Other chronic pain: Secondary | ICD-10-CM | POA: Insufficient documentation

## 2016-05-11 DIAGNOSIS — Z90710 Acquired absence of both cervix and uterus: Secondary | ICD-10-CM | POA: Insufficient documentation

## 2016-05-11 DIAGNOSIS — Z9889 Other specified postprocedural states: Secondary | ICD-10-CM | POA: Insufficient documentation

## 2016-05-11 DIAGNOSIS — Z888 Allergy status to other drugs, medicaments and biological substances status: Secondary | ICD-10-CM | POA: Insufficient documentation

## 2016-05-11 DIAGNOSIS — Z87442 Personal history of urinary calculi: Secondary | ICD-10-CM | POA: Insufficient documentation

## 2016-05-11 DIAGNOSIS — F17210 Nicotine dependence, cigarettes, uncomplicated: Secondary | ICD-10-CM | POA: Insufficient documentation

## 2016-05-11 DIAGNOSIS — Z800 Family history of malignant neoplasm of digestive organs: Secondary | ICD-10-CM | POA: Insufficient documentation

## 2016-05-11 DIAGNOSIS — Z880 Allergy status to penicillin: Secondary | ICD-10-CM | POA: Insufficient documentation

## 2016-05-11 DIAGNOSIS — Z841 Family history of disorders of kidney and ureter: Secondary | ICD-10-CM | POA: Insufficient documentation

## 2016-05-11 DIAGNOSIS — Z91048 Other nonmedicinal substance allergy status: Secondary | ICD-10-CM | POA: Insufficient documentation

## 2016-05-11 DIAGNOSIS — Z886 Allergy status to analgesic agent status: Secondary | ICD-10-CM | POA: Insufficient documentation

## 2016-05-11 DIAGNOSIS — Z79899 Other long term (current) drug therapy: Secondary | ICD-10-CM | POA: Insufficient documentation

## 2016-05-11 HISTORY — PX: URETEROSCOPY WITH HOLMIUM LASER LITHOTRIPSY: SHX6645

## 2016-05-11 HISTORY — PX: CYSTOSCOPY W/ URETERAL STENT PLACEMENT: SHX1429

## 2016-05-11 LAB — URINE DRUG SCREEN, QUALITATIVE (ARMC ONLY)
AMPHETAMINES, UR SCREEN: POSITIVE — AB
Barbiturates, Ur Screen: NOT DETECTED
Benzodiazepine, Ur Scrn: NOT DETECTED
COCAINE METABOLITE, UR ~~LOC~~: NOT DETECTED
Cannabinoid 50 Ng, Ur ~~LOC~~: NOT DETECTED
MDMA (ECSTASY) UR SCREEN: NOT DETECTED
METHADONE SCREEN, URINE: NOT DETECTED
OPIATE, UR SCREEN: NOT DETECTED
PHENCYCLIDINE (PCP) UR S: NOT DETECTED
Tricyclic, Ur Screen: NOT DETECTED

## 2016-05-11 SURGERY — URETEROSCOPY, WITH LITHOTRIPSY USING HOLMIUM LASER
Anesthesia: General | Laterality: Right | Wound class: Clean Contaminated

## 2016-05-11 MED ORDER — CIPROFLOXACIN IN D5W 400 MG/200ML IV SOLN
INTRAVENOUS | Status: AC
  Filled 2016-05-11: qty 200

## 2016-05-11 MED ORDER — HYDROMORPHONE HCL 1 MG/ML IJ SOLN
INTRAMUSCULAR | Status: DC | PRN
  Administered 2016-05-11: .25 mg via INTRAVENOUS

## 2016-05-11 MED ORDER — GENTAMICIN IN SALINE 1.6-0.9 MG/ML-% IV SOLN
80.0000 mg | INTRAVENOUS | Status: AC
  Administered 2016-05-11: 80 mg via INTRAVENOUS
  Filled 2016-05-11: qty 50

## 2016-05-11 MED ORDER — FENTANYL CITRATE (PF) 100 MCG/2ML IJ SOLN
INTRAMUSCULAR | Status: AC
  Filled 2016-05-11: qty 2

## 2016-05-11 MED ORDER — HYDROCODONE-ACETAMINOPHEN 5-325 MG PO TABS: 1.0000 | ORAL_TABLET | ORAL | 0 refills | Status: DC | PRN

## 2016-05-11 MED ORDER — PHENYLEPHRINE HCL 10 MG/ML IJ SOLN
INTRAMUSCULAR | Status: DC | PRN
  Administered 2016-05-11: 100 ug via INTRAVENOUS

## 2016-05-11 MED ORDER — FENTANYL CITRATE (PF) 100 MCG/2ML IJ SOLN
25.0000 ug | INTRAMUSCULAR | Status: AC | PRN
  Administered 2016-05-11 (×6): 25 ug via INTRAVENOUS

## 2016-05-11 MED ORDER — ONDANSETRON HCL 4 MG/2ML IJ SOLN
INTRAMUSCULAR | Status: DC | PRN
  Administered 2016-05-11: 4 mg via INTRAVENOUS

## 2016-05-11 MED ORDER — MIDAZOLAM HCL 2 MG/2ML IJ SOLN
INTRAMUSCULAR | Status: DC | PRN
  Administered 2016-05-11 (×2): 2 mg via INTRAVENOUS

## 2016-05-11 MED ORDER — HYDROCODONE-ACETAMINOPHEN 5-325 MG PO TABS
ORAL_TABLET | ORAL | Status: AC
  Administered 2016-05-11: 1 via ORAL
  Filled 2016-05-11: qty 1

## 2016-05-11 MED ORDER — ONDANSETRON HCL 4 MG/2ML IJ SOLN: 4.0000 mg | Freq: Once | INTRAMUSCULAR | Status: DC | PRN

## 2016-05-11 MED ORDER — FENTANYL CITRATE (PF) 100 MCG/2ML IJ SOLN
INTRAMUSCULAR | Status: DC | PRN
  Administered 2016-05-11: 100 ug via INTRAVENOUS
  Administered 2016-05-11: 50 ug via INTRAVENOUS

## 2016-05-11 MED ORDER — LACTATED RINGERS IV SOLN
INTRAVENOUS | Status: DC
  Administered 2016-05-11: 10:00:00 via INTRAVENOUS

## 2016-05-11 MED ORDER — FAMOTIDINE 20 MG PO TABS
ORAL_TABLET | ORAL | Status: AC
  Administered 2016-05-11: 20 mg via ORAL
  Filled 2016-05-11: qty 1

## 2016-05-11 MED ORDER — CIPROFLOXACIN IN D5W 400 MG/200ML IV SOLN
400.0000 mg | INTRAVENOUS | Status: AC
  Administered 2016-05-11: 400 mg via INTRAVENOUS

## 2016-05-11 MED ORDER — HYDROCODONE-ACETAMINOPHEN 5-325 MG PO TABS
1.0000 | ORAL_TABLET | ORAL | Status: DC | PRN
  Administered 2016-05-11: 1 via ORAL

## 2016-05-11 MED ORDER — LIDOCAINE HCL (CARDIAC) 20 MG/ML IV SOLN
INTRAVENOUS | Status: DC | PRN
  Administered 2016-05-11: 100 mg via INTRAVENOUS

## 2016-05-11 MED ORDER — DEXAMETHASONE SODIUM PHOSPHATE 10 MG/ML IJ SOLN
INTRAMUSCULAR | Status: DC | PRN
  Administered 2016-05-11: 10 mg via INTRAVENOUS

## 2016-05-11 MED ORDER — PROPOFOL 10 MG/ML IV BOLUS
INTRAVENOUS | Status: DC | PRN
  Administered 2016-05-11: 200 mg via INTRAVENOUS

## 2016-05-11 MED ORDER — FAMOTIDINE 20 MG PO TABS
20.0000 mg | ORAL_TABLET | Freq: Once | ORAL | Status: AC
  Administered 2016-05-11: 20 mg via ORAL

## 2016-05-11 MED ORDER — PENTAFLUOROPROP-TETRAFLUOROETH EX AERO
INHALATION_SPRAY | CUTANEOUS | Status: AC
  Filled 2016-05-11: qty 30

## 2016-05-11 SURGICAL SUPPLY — 24 items
BACTOSHIELD CHG 4% 4OZ (MISCELLANEOUS) ×2
BASKET ZERO TIP 1.9FR (BASKET) ×3 IMPLANT
CATH URETL 5X70 OPEN END (CATHETERS) ×3 IMPLANT
CNTNR SPEC 2.5X3XGRAD LEK (MISCELLANEOUS) ×1
CONT SPEC 4OZ STER OR WHT (MISCELLANEOUS) ×2
CONTAINER SPEC 2.5X3XGRAD LEK (MISCELLANEOUS) ×1 IMPLANT
FIBER LASER LITHO 273 (Laser) ×3 IMPLANT
GLOVE BIO SURGEON STRL SZ7 (GLOVE) ×3 IMPLANT
GLOVE BIO SURGEON STRL SZ7.5 (GLOVE) ×3 IMPLANT
GOWN STRL REUS W/ TWL LRG LVL4 (GOWN DISPOSABLE) ×1 IMPLANT
GOWN STRL REUS W/TWL LRG LVL4 (GOWN DISPOSABLE) ×2
GOWN STRL REUS W/TWL XL LVL3 (GOWN DISPOSABLE) ×3 IMPLANT
GUIDEWIRE SUPER STIFF (WIRE) IMPLANT
INTRODUCER DILATOR DOUBLE (INTRODUCER) IMPLANT
KIT RM TURNOVER CYSTO AR (KITS) ×3 IMPLANT
PACK CYSTO AR (MISCELLANEOUS) ×3 IMPLANT
SCRUB CHG 4% DYNA-HEX 4OZ (MISCELLANEOUS) ×1 IMPLANT
SENSORWIRE 0.038 NOT ANGLED (WIRE) ×3
SET CYSTO W/LG BORE CLAMP LF (SET/KITS/TRAYS/PACK) ×3 IMPLANT
SHEATH URETERAL 13/15X36 1L (SHEATH) IMPLANT
STENT URET 6FRX24 CONTOUR (STENTS) ×3 IMPLANT
SURGILUBE 2OZ TUBE FLIPTOP (MISCELLANEOUS) ×3 IMPLANT
SYRINGE IRR TOOMEY STRL 70CC (SYRINGE) ×3 IMPLANT
WIRE SENSOR 0.038 NOT ANGLED (WIRE) ×1 IMPLANT

## 2016-05-11 NOTE — Anesthesia Preprocedure Evaluation (Addendum)
Anesthesia Evaluation  Patient identified by MRN, date of birth, ID band Patient awake    Reviewed: Allergy & Precautions, NPO status , Patient's Chart, lab work & pertinent test results  History of Anesthesia Complications Negative for: history of anesthetic complications  Airway Mallampati: II       Dental   Pulmonary COPD, Current Smoker,           Cardiovascular negative cardio ROS       Neuro/Psych negative neurological ROS     GI/Hepatic negative GI ROS, (+)     substance abuse (positive for amphetamines today, vital signs stable, pt otherwise assymptomatic)  ,   Endo/Other  negative endocrine ROS  Renal/GU Renal disease (stones)     Musculoskeletal   Abdominal   Peds  Hematology negative hematology ROS (+)   Anesthesia Other Findings   Reproductive/Obstetrics                           Anesthesia Physical Anesthesia Plan  ASA: II  Anesthesia Plan: General   Post-op Pain Management:    Induction: Intravenous  Airway Management Planned: LMA  Additional Equipment:   Intra-op Plan:   Post-operative Plan:   Informed Consent: I have reviewed the patients History and Physical, chart, labs and discussed the procedure including the risks, benefits and alternatives for the proposed anesthesia with the patient or authorized representative who has indicated his/her understanding and acceptance.     Plan Discussed with:   Anesthesia Plan Comments:         Anesthesia Quick Evaluation

## 2016-05-11 NOTE — Interval H&P Note (Signed)
History and Physical Interval Note:  05/11/2016 9:34 AM  Teresa Davenport  has presented today for surgery, with the diagnosis of RIGHT URETERAL STONE  The various methods of treatment have been discussed with the patient and family. After consideration of risks, benefits and other options for treatment, the patient has consented to  Procedure(s): URETEROSCOPY WITH HOLMIUM LASER LITHOTRIPSY (Right) CYSTOSCOPY WITH STENT REPLACEMENT (Right) as a surgical intervention .  The patient's history has been reviewed, patient examined, no change in status, stable for surgery.  I have reviewed the patient's chart and labs.  Questions were answered to the patient's satisfaction.    RRR Lungs clear  Hildred LaserBrian James Shivam Mestas

## 2016-05-11 NOTE — Progress Notes (Signed)
Pt up to bathroom to void states ready to go home

## 2016-05-11 NOTE — Anesthesia Procedure Notes (Signed)
Procedure Name: LMA Insertion Date/Time: 05/11/2016 10:44 AM Performed by: Marlana SalvageJESSUP, Kniyah Khun Pre-anesthesia Checklist: Patient identified, Emergency Drugs available, Suction available, Patient being monitored and Timeout performed Patient Re-evaluated:Patient Re-evaluated prior to inductionOxygen Delivery Method: Circle system utilized Preoxygenation: Pre-oxygenation with 100% oxygen Intubation Type: IV induction Ventilation: Mask ventilation without difficulty LMA: LMA inserted LMA Size: 4.0 Number of attempts: 1 Placement Confirmation: positive ETCO2 and breath sounds checked- equal and bilateral Dental Injury: Teeth and Oropharynx as per pre-operative assessment

## 2016-05-11 NOTE — Telephone Encounter (Signed)
-----   Message from Hildred LaserBrian James Budzyn, MD sent at 05/11/2016 11:23 AM EDT ----- Patient needs to be seen next week for cysto/stent removal.

## 2016-05-11 NOTE — Transfer of Care (Signed)
Immediate Anesthesia Transfer of Care Note  Patient: Georgina Peeratricia L Kafer  Procedure(s) Performed: Procedure(s): URETEROSCOPY WITH HOLMIUM LASER LITHOTRIPSY (Right) CYSTOSCOPY WITH STENT REPLACEMENT (Right)  Patient Location: PACU  Anesthesia Type:General  Level of Consciousness: awake, alert , oriented and patient cooperative  Airway & Oxygen Therapy: Patient Spontanous Breathing  Post-op Assessment: Report given to RN, Post -op Vital signs reviewed and stable and Patient moving all extremities X 4  Post vital signs: Reviewed and stable  Last Vitals:  Vitals:   05/11/16 0945 05/11/16 1125  BP: 120/72 115/82  Pulse: 84 85  Resp: 18 11  Temp: 36.7 C 36.2 C    Last Pain:  Vitals:   05/11/16 0945  TempSrc: Oral  PainSc: 8          Complications: No apparent anesthesia complications
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
## 2016-05-11 NOTE — Op Note (Signed)
Date of procedure: 05/11/16  Preoperative diagnosis:  1. Right ureteral stone 2. Left renal stone   Postoperative diagnosis:  1. Right ureteral stone 2. Left renal stone   Procedure: 1. Cystoscopy 2. Right ureteroscopy 3. Laser lithotripsy 4. Stone basketing 5. Right retrograde pyelogram with interpretation 6. Right ureteral stent exchange 6 [place] by 24 cm   Surgeon: Baruch Gouty, MD  Anesthesia: General  Complications: None  Intraoperative findings: The patient had a 5 mm right proximal ureteral stone was broken into small fragments are removed. Retrograde powder on the right at the procedure showed no residual filling defects.  EBL: None  Specimens: Right ureteral stone  Drains: 6 [place] by 24 cm right double-J ureteral stent  Disposition: Stable to the postanesthesia care unit  Indication for procedure: The patient is a 50 y.o. female with a right ureteral stone status post stent placement in treatment of sepsis. She presents today for definitive stone management. She also has a large left lower pole renal stone that I discussed with the patient will be addressed at another time.  After reviewing the management options for treatment, the patient elected to proceed with the above surgical procedure(s). We have discussed the potential benefits and risks of the procedure, side effects of the proposed treatment, the likelihood of the patient achieving the goals of the procedure, and any potential problems that might occur during the procedure or recuperation. Informed consent has been obtained.  Description of procedure: The patient was met in the preoperative area. All risks, benefits, and indications of the procedure were described in great detail. The patient consented to the procedure. Preoperative antibiotics were given. The patient was taken to the operative theater. General anesthesia was induced per the anesthesia service. The patient was then placed in the dorsal  lithotomy position and prepped and draped in the usual sterile fashion. A preoperative timeout was called.   A 21 French 30 cystoscope was inserted into the patient's bladder per urethra atraumatically. The right ureteral stent was grasped with flexible graspers and brought to level the urethral meatus. A sensor wire was exchanged through the stent to the level of the renal pelvis on the right under fluoroscopy. The stent was removed. Semirigid ureteroscope was then introduced into the right ureter ureter. The stone was found to be in the proximal ureter. His broken 2 small fragments laser lithotripsy. The firmness were removed with a stone basket and sent to pathology. Pain ureteroscopy revealed no further stone burden. Right retrograde powder and tension no filling defects. The ureter scope was withdrawn. With the aid of the cystoscope, 6 French by 26 cm double-J ureteral stent was placed the sensor wire removed. A curl seen in the right renal pelvis under fluoroscopy and in the urinary bladder direct visualization. The patient's bladder was drained. She was awoken from anesthesia and transferred in stable condition to postanesthesia care unit.  Plan: The patient will follow-up in one week for stent removal in the office. She'll need a renal ultrasound 1 month rule out iatrogenic hydronephrosis. We will also need to address her left lower pole stone some point. This may be best amenable due to its size and location with PCNL.  Baruch Gouty, M.D.

## 2016-05-11 NOTE — Discharge Instructions (Signed)

## 2016-05-11 NOTE — Anesthesia Postprocedure Evaluation (Signed)
Anesthesia Post Note  Patient: Teresa Davenport  Procedure(s) Performed: Procedure(s) (LRB): URETEROSCOPY WITH HOLMIUM LASER LITHOTRIPSY (Right) CYSTOSCOPY WITH STENT REPLACEMENT (Right)  Patient location during evaluation: PACU Anesthesia Type: General Level of consciousness: awake Pain management: pain level controlled Vital Signs Assessment: post-procedure vital signs reviewed and stable Respiratory status: nonlabored ventilation Cardiovascular status: stable Anesthetic complications: no    Last Vitals:  Vitals:   05/11/16 0945 05/11/16 1125  BP: 120/72 115/82  Pulse: 84 85  Resp: 18 11  Temp: 36.7 C 36.2 C    Last Pain:  Vitals:   05/11/16 1125  TempSrc:   PainSc: 9                  VAN STAVEREN,Tessa Seaberry

## 2016-05-11 NOTE — Progress Notes (Signed)
+  UDS, ampedtimines ok with dr. Henrene HawkingKephart.

## 2016-05-11 NOTE — Progress Notes (Signed)
States still hurting but asked to get dressed and go home

## 2016-05-11 NOTE — Anesthesia Procedure Notes (Deleted)
Performed by: Sharayah Renfrow       

## 2016-05-12 ENCOUNTER — Telehealth: Payer: Self-pay

## 2016-05-12 NOTE — Telephone Encounter (Signed)
Pt called stating she is in severe pain and the pain medication given at discharge is not helping. Pt stated nurse from hospital advised her to call [facility]. Please advise.

## 2016-05-12 NOTE — Telephone Encounter (Signed)
Per Dr. Sherryl BartersBudzyn pt should take percocet and alternate with tylenol/ibuprofen for pain. Pt voiced frustration and stated "I dont know what im supposed to do now!" Reinforced with pt to drink plenty of fluids and take d/c medication as prescribed. Pt then stated "I get that. I guess im going to have to go to the ER for this pain!" and hung up.

## 2016-05-12 NOTE — Telephone Encounter (Signed)
APPT. HAS BEEN MADE

## 2016-05-19 ENCOUNTER — Encounter: Payer: Self-pay | Admitting: Urology

## 2016-05-19 ENCOUNTER — Ambulatory Visit (INDEPENDENT_AMBULATORY_CARE_PROVIDER_SITE_OTHER): Payer: Self-pay | Admitting: Urology

## 2016-05-19 VITALS — BP 115/74 | HR 101 | Ht 62.0 in | Wt 117.9 lb

## 2016-05-19 DIAGNOSIS — N200 Calculus of kidney: Secondary | ICD-10-CM

## 2016-05-19 LAB — URINALYSIS, COMPLETE
Bilirubin, UA: NEGATIVE
GLUCOSE, UA: NEGATIVE
Ketones, UA: NEGATIVE
NITRITE UA: NEGATIVE
PH UA: 6.5 (ref 5.0–7.5)
Specific Gravity, UA: 1.025 (ref 1.005–1.030)
UUROB: 0.2 mg/dL (ref 0.2–1.0)

## 2016-05-19 LAB — MICROSCOPIC EXAMINATION: BACTERIA UA: NONE SEEN

## 2016-05-19 MED ORDER — CIPROFLOXACIN HCL 500 MG PO TABS: 500.0000 mg | ORAL_TABLET | Freq: Once | ORAL | Status: DC

## 2016-05-19 MED ORDER — LIDOCAINE HCL 2 % EX GEL: 1.0000 "application " | Freq: Once | CUTANEOUS | Status: DC

## 2016-05-19 NOTE — Progress Notes (Signed)
   05/19/16  CC:  Chief Complaint  Patient presents with  . Cysto Stent Removal    Nephrolithiasis    HPI: The patient is a 50 year old female presents for cystoscopy and right ureteral stent removal after removal via ureteroscopy of a right 5 mm proximal stone.  Blood pressure 115/74, pulse (!) 101, height 5\' 2"  (1.575 m), weight 117 lb 14.4 oz (53.5 kg). NED. A&Ox3.   No respiratory distress   Abd soft, NT, ND Normal external genitalia with patent urethral meatus  Cystoscopy Procedure Note  Patient identification was confirmed, informed consent was obtained, and patient was prepped using Betadine solution.  Lidocaine jelly was administered per urethral meatus.    Preoperative abx where received prior to procedure.    Procedure: - Flexible cystoscope introduced, without any difficulty.   - The right ureteral stent was grasped with flexible graspers and removed per urethra intact.  Post-Procedure: - Patient tolerated the procedure well  Assessment/ Plan:  1. Nephrolithiasis Follow-up in 1 month with renal ultrasound prior to rule out iatrogenic hydronephrosis. We will discuss her stone analysis at that time as it is still pending. We'll need to address her large left lower pole stone also at that time. He may need percutaneous nephrolithotomy given its location and size.  Hildred LaserBrian James Cloie Wooden, MD

## 2016-05-23 LAB — STONE ANALYSIS
CA PHOS CRY STONE QL IR: 30 %
Ca Oxalate,Dihydrate: 15 %
Ca Oxalate,Monohydr.: 55 %
Stone Weight KSTONE: 25.5 mg

## 2016-05-27 IMAGING — CR DG CHEST 2V
2 series · 2 of 2 positions shown · non-contrast
Comparison: 07/05/2015

CLINICAL DATA: Chest pain with recent rib injury.  Vomiting today.

EXAM:
CHEST  2 VIEW

[chest pa]
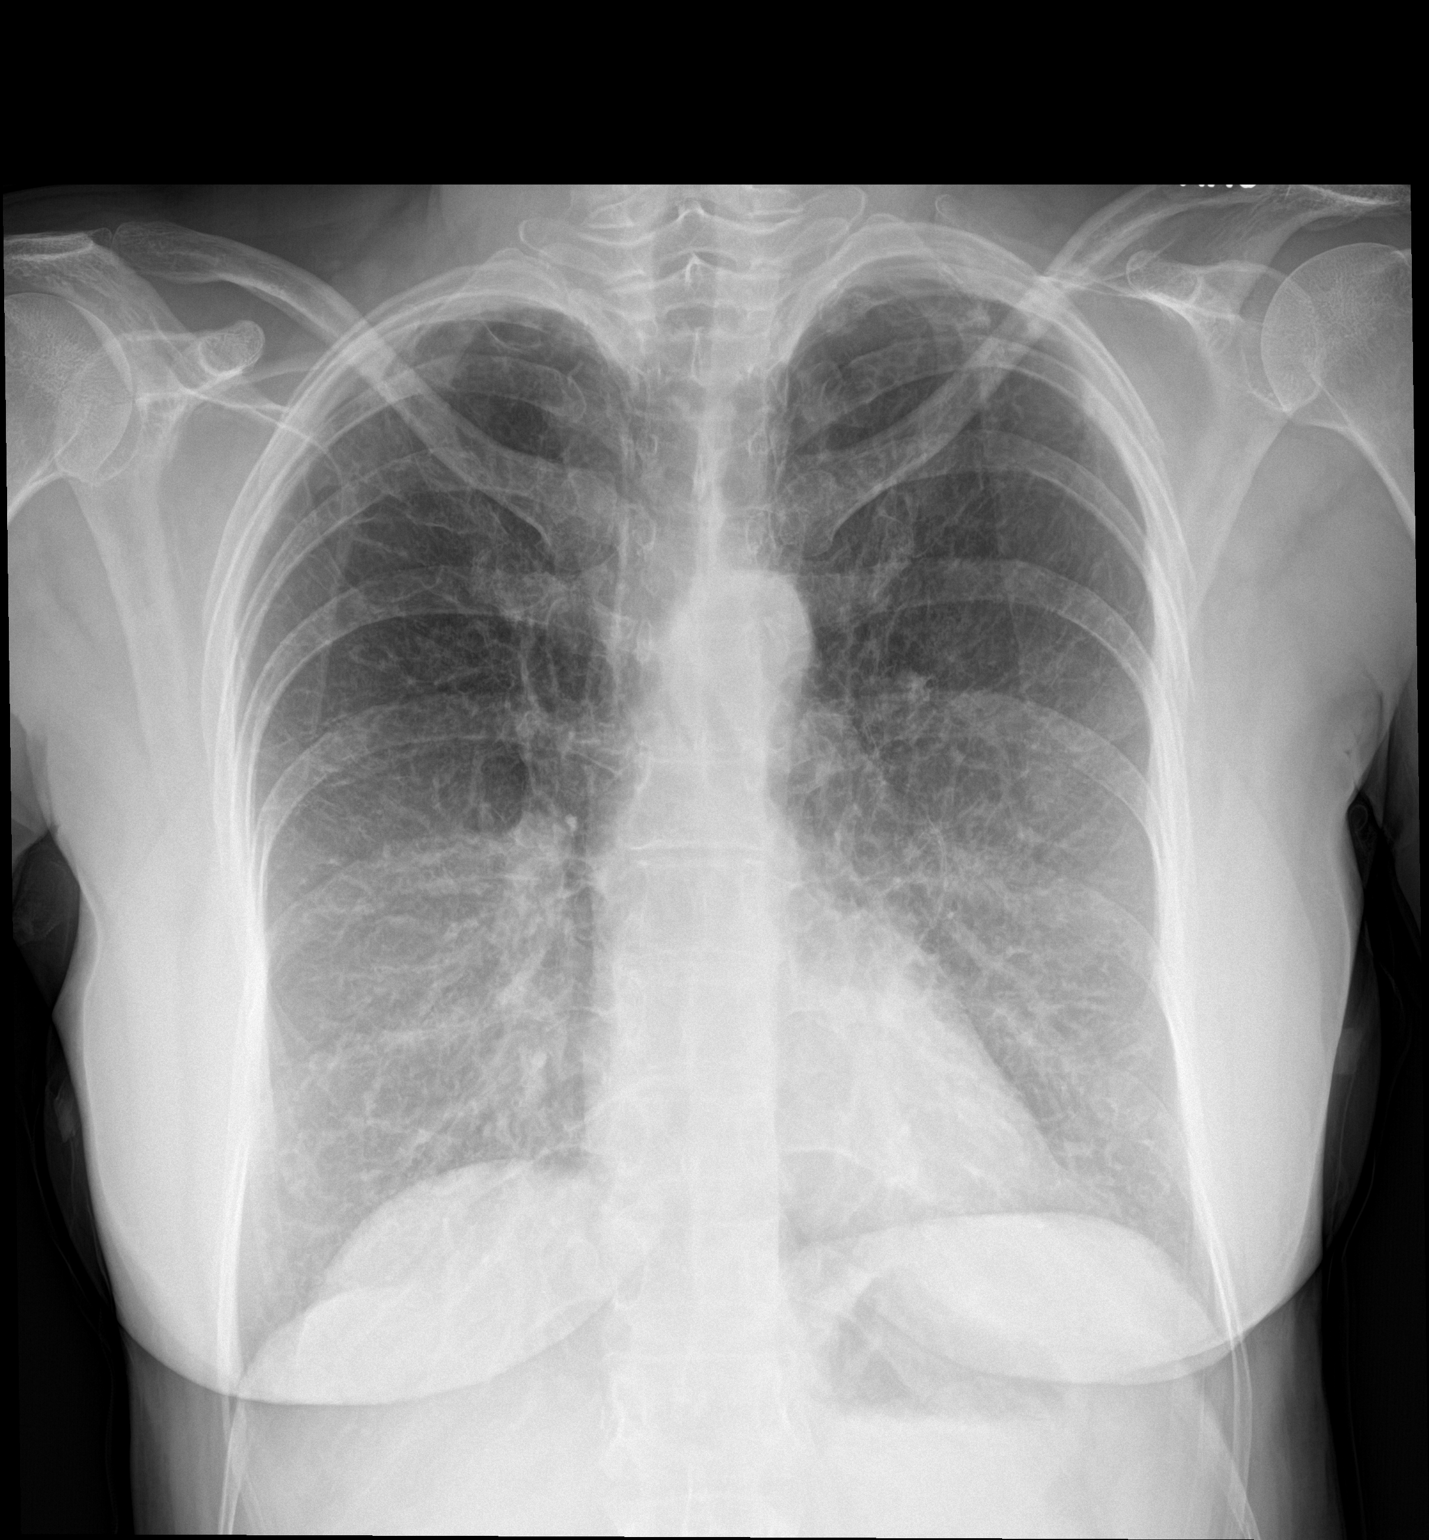

[chest lat]
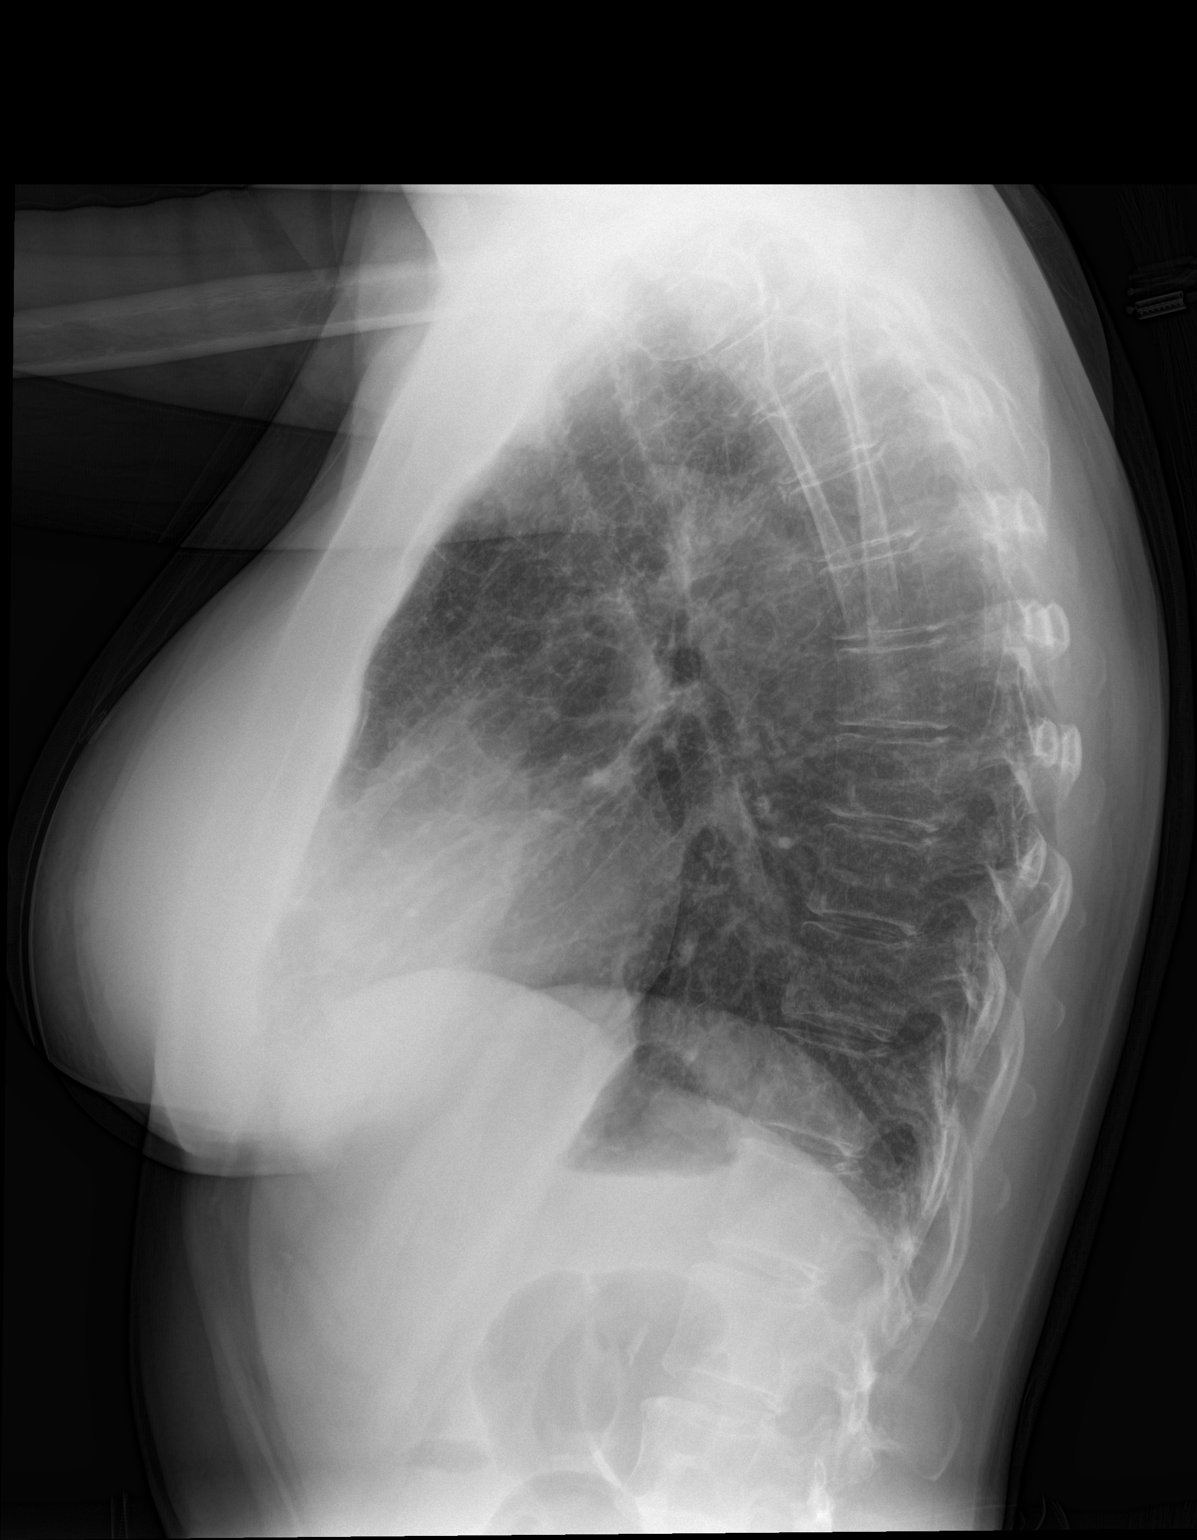

[2 of 2 positions shown; findings below may reference images not displayed]

FINDINGS: Emphysematous lung disease. The cardiomediastinal contours are
normal. Pulmonary vasculature is normal. No consolidation, pleural
effusion, or pneumothorax.

Fractures of left lateral third, fourth, and fifth ribs, likely
present on prior exam, some surrounding callus today.
IMPRESSION: 1. Left lateral third through fifth rib fractures, likely subacute.
No intrathoracic injury/pneumothorax.
2. Emphysema.

## 2016-06-14 ENCOUNTER — Ambulatory Visit: Payer: MEDICAID

## 2016-06-15 ENCOUNTER — Ambulatory Visit
Admission: RE | Admit: 2016-06-15 | Discharge: 2016-06-15 | Disposition: A | Discharge status: Home / Self Care | Payer: Self-pay | Source: Ambulatory Visit | Attending: Urology | Admitting: Urology

## 2016-06-15 DIAGNOSIS — Z09 Encounter for follow-up examination after completed treatment for conditions other than malignant neoplasm: Secondary | ICD-10-CM | POA: Insufficient documentation

## 2016-06-15 DIAGNOSIS — N200 Calculus of kidney: Secondary | ICD-10-CM

## 2016-06-15 DIAGNOSIS — Z87442 Personal history of urinary calculi: Secondary | ICD-10-CM | POA: Insufficient documentation

## 2016-06-16 ENCOUNTER — Ambulatory Visit (INDEPENDENT_AMBULATORY_CARE_PROVIDER_SITE_OTHER): Payer: Self-pay | Admitting: Urology

## 2016-06-16 DIAGNOSIS — N132 Hydronephrosis with renal and ureteral calculous obstruction: Secondary | ICD-10-CM

## 2016-06-16 DIAGNOSIS — N200 Calculus of kidney: Secondary | ICD-10-CM
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
## 2016-06-17 ENCOUNTER — Other Ambulatory Visit: Payer: Self-pay | Admitting: Radiology

## 2016-06-17 ENCOUNTER — Telehealth: Payer: Self-pay | Admitting: Radiology

## 2016-06-17 DIAGNOSIS — N1330 Unspecified hydronephrosis: Secondary | ICD-10-CM

## 2016-06-17 DIAGNOSIS — N201 Calculus of ureter: Secondary | ICD-10-CM

## 2016-06-17 NOTE — Telephone Encounter (Signed)
Notified pt's mother of surgery scheduled with Dr Sherryl BartersBudzyn on 07/29/16 [facility] & pre-admit testing appt on 07/20/15 @9 :00. Mother voices understanding. Requested that pt call back with any questions.

## 2016-06-23 ENCOUNTER — Other Ambulatory Visit: Payer: Self-pay | Admitting: Radiology

## 2016-06-23 DIAGNOSIS — N201 Calculus of ureter: Secondary | ICD-10-CM

## 2016-07-19 ENCOUNTER — Encounter
Admission: RE | Admit: 2016-07-19 | Discharge: 2016-07-19 | Disposition: A | Discharge status: Home / Self Care | Payer: Self-pay | Source: Ambulatory Visit | Attending: Urology | Admitting: Urology

## 2016-07-19 DIAGNOSIS — Z01812 Encounter for preprocedural laboratory examination: Secondary | ICD-10-CM | POA: Insufficient documentation

## 2016-07-19 DIAGNOSIS — N200 Calculus of kidney: Secondary | ICD-10-CM | POA: Insufficient documentation

## 2016-07-19 HISTORY — DX: Pneumonia, unspecified organism: J18.9

## 2016-07-19 HISTORY — DX: Adverse effect of unspecified anesthetic, initial encounter: T41.45XA

## 2016-07-19 HISTORY — DX: Other complications of anesthesia, initial encounter: T88.59XA

## 2016-07-19 HISTORY — DX: Unspecified intracranial injury with loss of consciousness of unspecified duration, initial encounter: S06.9X9A

## 2016-07-19 HISTORY — DX: Meningitis, unspecified: G03.9

## 2016-07-19 LAB — BASIC METABOLIC PANEL
ANION GAP: 6 (ref 5–15)
BUN: 12 mg/dL (ref 6–20)
CHLORIDE: 100 mmol/L — AB (ref 101–111)
CO2: 32 mmol/L (ref 22–32)
Calcium: 9.9 mg/dL (ref 8.9–10.3)
Creatinine, Ser: 0.72 mg/dL (ref 0.44–1.00)
GFR calc non Af Amer: >60 mL/min (ref >60)
Glucose, Bld: 78 mg/dL (ref 65–99)
Potassium: 3.1 mmol/L — ABNORMAL LOW (ref 3.5–5.1)
Sodium: 138 mmol/L (ref 135–145)

## 2016-07-19 LAB — CBC
HEMATOCRIT: 40.5 % (ref 35.0–47.0)
HEMOGLOBIN: 13.8 g/dL (ref 12.0–16.0)
MCH: 31.5 pg (ref 26.0–34.0)
MCHC: 34 g/dL (ref 32.0–36.0)
MCV: 92.8 fL (ref 80.0–100.0)
Platelets: 220 10*3/uL (ref 150–440)
RBC: 4.37 MIL/uL (ref 3.80–5.20)
RDW: 14.1 % (ref 11.5–14.5)
WBC: 7.3 10*3/uL (ref 3.6–11.0)

## 2016-07-19 LAB — APTT: aPTT: 27 seconds (ref 24–36)

## 2016-07-19 LAB — PROTIME-INR
INR: 0.88
Prothrombin Time: 11.9 seconds (ref 11.4–15.2)

## 2016-07-19 NOTE — Pre-Procedure Instructions (Signed)
Request for potassium supplement for pt has been faxed to Dr. Jorja LoaBudzyn's office related to potassium level of 3.1 at today's PAT visit.

## 2016-07-19 NOTE — Patient Instructions (Signed)
  Your procedure is scheduled on: Friday Jul 29, 2016. Report to Same Day Surgery. To find out your arrival time please call [phone] between 1PM - 3PM on Thursday Jan. 18. 2018.  Remember: Instructions that are not followed completely may result in serious medical risk, up to and including death, or upon the discretion of your surgeon and anesthesiologist your surgery may need to be rescheduled.    _x___ 1. Do not eat food or drink liquids after midnight. No gum chewing or hard candies.     ____ 2. No Alcohol for 24 hours before or after surgery.   ____ 3. Bring all medications with you on the day of surgery if instructed.    __x__ 4. Notify your doctor if there is any change in your medical condition     (cold, fever, infections).    _x____ 5. No smoking 24 hours prior to surgery.     Do not wear jewelry, make-up, hairpins, clips or nail polish.  Do not wear lotions, powders, or perfumes.   Do not shave 48 hours prior to surgery. Men may shave face and neck.  Do not bring valuables to the hospital.    [facility] is not responsible for any belongings or valuables.               Contacts, dentures or bridgework may not be worn into surgery.  Leave your suitcase in the car. After surgery it may be brought to your room.  For patients admitted to the hospital, discharge time is determined by your treatment team.   Patients discharged the day of surgery will not be allowed to drive home.    Please read over the following fact sheets that you were given:   [facility] Pre[facility]  ____ Take these medicines the morning of surgery with A SIP OF WATER: NONE    ____ Fleet Enema (as directed)   _x___ Use CHG Soap as directed on instruction sheet  ____ Use inhalers on the day of surgery and bring to hospital day of surgery  ____ Stop metformin 2 days prior to surgery    ____ Take 1/2 of usual insulin dose the night before surgery and none on the morning of surgery.    ____ Stop Coumadin/Plavix/aspirin on does not apply.  ____ Stop Anti-inflammatories such as Advil, Aleve, Ibuprofen, Motrin, Naproxen,  Naprosyn, Goodies powders or aspirin products. OK to take Tylenol.   ____ Stop supplements until after surgery.    ____ Bring C-Pap to the hospital.

## 2016-07-20 LAB — URINE CULTURE

## 2016-07-21 ENCOUNTER — Encounter: Payer: Self-pay | Admitting: Radiology

## 2016-07-21 ENCOUNTER — Other Ambulatory Visit: Payer: Self-pay | Admitting: Radiology

## 2016-07-21 NOTE — Pre-Procedure Instructions (Signed)
Urine culture results faxed to dr budzyn 

## 2016-07-22 ENCOUNTER — Ambulatory Visit: Payer: Self-pay

## 2016-07-25 ENCOUNTER — Ambulatory Visit (INDEPENDENT_AMBULATORY_CARE_PROVIDER_SITE_OTHER): Payer: Self-pay | Admitting: Family Medicine

## 2016-07-25 ENCOUNTER — Ambulatory Visit: Payer: Self-pay

## 2016-07-25 VITALS — BP 117/65 | HR 88 | Ht 62.0 in | Wt 135.0 lb

## 2016-07-25 DIAGNOSIS — N132 Hydronephrosis with renal and ureteral calculous obstruction: Secondary | ICD-10-CM

## 2016-07-25 LAB — MICROSCOPIC EXAMINATION: WBC, UA: >30 /hpf — AB (ref 0–?)

## 2016-07-25 LAB — URINALYSIS, COMPLETE
Bilirubin, UA: NEGATIVE
GLUCOSE, UA: NEGATIVE
KETONES UA: NEGATIVE
Nitrite, UA: NEGATIVE
SPEC GRAV UA: 1.02 (ref 1.005–1.030)
Urobilinogen, Ur: 0.2 mg/dL (ref 0.2–1.0)
pH, UA: 6 (ref 5.0–7.5)

## 2016-07-25 NOTE — Pre-Procedure Instructions (Signed)
Spoke with Amy at Dr. Margarita MailBudzin's office, pt is coming to their office today to repeat urine culture.

## 2016-07-25 NOTE — Progress Notes (Signed)
In and Out Catheterization  Patient is present today for a I & O catheterization due to Hydronephrosis, pre op. Patient was cleaned and prepped in a sterile fashion with betadine and Lidocaine 2% jelly was instilled into the urethra.  A 14FR cath was inserted no complications were noted , 60ml of urine return was noted, urine was orange in color. A clean urine sample was collected for UA, UCX. Bladder was drained  And catheter was removed with out difficulty.    Preformed by: Teressa Lowerarrie Dyanna Seiter, CMA

## 2016-07-28 ENCOUNTER — Other Ambulatory Visit: Payer: Self-pay | Admitting: Physician Assistant

## 2016-07-28 ENCOUNTER — Other Ambulatory Visit: Payer: Self-pay | Admitting: Radiology

## 2016-07-29 ENCOUNTER — Inpatient Hospital Stay: Payer: Self-pay

## 2016-07-29 ENCOUNTER — Ambulatory Visit
Admission: RE | Admit: 2016-07-29 | Discharge: 2016-07-29 | Disposition: A | Discharge status: Home / Self Care | Payer: Self-pay | Source: Ambulatory Visit | Attending: Urology | Admitting: Urology

## 2016-07-29 ENCOUNTER — Inpatient Hospital Stay: Payer: Self-pay | Admitting: Anesthesiology

## 2016-07-29 ENCOUNTER — Telehealth: Payer: Self-pay | Admitting: Urology

## 2016-07-29 ENCOUNTER — Encounter
Admission: RE | Discharge status: Against Medical Advice | Payer: Self-pay | Source: Ambulatory Visit | Attending: Urology

## 2016-07-29 ENCOUNTER — Encounter: Payer: Self-pay | Admitting: *Deleted

## 2016-07-29 ENCOUNTER — Inpatient Hospital Stay
Admission: RE | Admit: 2016-07-29 | Discharge: 2016-08-03 | DRG: 661 | Discharge status: Against Medical Advice | Payer: Self-pay | Source: Ambulatory Visit | Attending: Urology | Admitting: Urology

## 2016-07-29 DIAGNOSIS — N200 Calculus of kidney: Secondary | ICD-10-CM

## 2016-07-29 DIAGNOSIS — E876 Hypokalemia: Secondary | ICD-10-CM | POA: Diagnosis present

## 2016-07-29 DIAGNOSIS — Z881 Allergy status to other antibiotic agents status: Secondary | ICD-10-CM

## 2016-07-29 DIAGNOSIS — R5082 Postprocedural fever: Secondary | ICD-10-CM | POA: Diagnosis not present

## 2016-07-29 DIAGNOSIS — Z87442 Personal history of urinary calculi: Secondary | ICD-10-CM

## 2016-07-29 DIAGNOSIS — Z79899 Other long term (current) drug therapy: Secondary | ICD-10-CM

## 2016-07-29 DIAGNOSIS — Z841 Family history of disorders of kidney and ureter: Secondary | ICD-10-CM

## 2016-07-29 DIAGNOSIS — Z880 Allergy status to penicillin: Secondary | ICD-10-CM

## 2016-07-29 DIAGNOSIS — N201 Calculus of ureter: Secondary | ICD-10-CM

## 2016-07-29 DIAGNOSIS — Z888 Allergy status to other drugs, medicaments and biological substances status: Secondary | ICD-10-CM

## 2016-07-29 DIAGNOSIS — F17210 Nicotine dependence, cigarettes, uncomplicated: Secondary | ICD-10-CM | POA: Diagnosis present

## 2016-07-29 DIAGNOSIS — J449 Chronic obstructive pulmonary disease, unspecified: Secondary | ICD-10-CM | POA: Diagnosis present

## 2016-07-29 DIAGNOSIS — N1330 Unspecified hydronephrosis: Secondary | ICD-10-CM

## 2016-07-29 DIAGNOSIS — R000 Tachycardia, unspecified: Secondary | ICD-10-CM | POA: Diagnosis not present

## 2016-07-29 DIAGNOSIS — Z800 Family history of malignant neoplasm of digestive organs: Secondary | ICD-10-CM

## 2016-07-29 DIAGNOSIS — Z87820 Personal history of traumatic brain injury: Secondary | ICD-10-CM

## 2016-07-29 DIAGNOSIS — Z91048 Other nonmedicinal substance allergy status: Secondary | ICD-10-CM

## 2016-07-29 DIAGNOSIS — Z419 Encounter for procedure for purposes other than remedying health state, unspecified: Secondary | ICD-10-CM

## 2016-07-29 DIAGNOSIS — R109 Unspecified abdominal pain: Secondary | ICD-10-CM

## 2016-07-29 HISTORY — PX: NEPHROLITHOTOMY: SHX5134

## 2016-07-29 HISTORY — PX: IR GENERIC HISTORICAL: IMG1180011

## 2016-07-29 LAB — CBC
HEMATOCRIT: 40.3 % (ref 35.0–47.0)
Hemoglobin: 13.9 g/dL (ref 12.0–16.0)
MCH: 31.9 pg (ref 26.0–34.0)
MCHC: 34.4 g/dL (ref 32.0–36.0)
MCV: 92.8 fL (ref 80.0–100.0)
Platelets: 245 10*3/uL (ref 150–440)
RBC: 4.34 MIL/uL (ref 3.80–5.20)
RDW: 13.8 % (ref 11.5–14.5)
WBC: 6.5 10*3/uL (ref 3.6–11.0)

## 2016-07-29 LAB — BASIC METABOLIC PANEL
Anion gap: 9 (ref 5–15)
BUN: 11 mg/dL (ref 6–20)
CHLORIDE: 101 mmol/L (ref 101–111)
CO2: 29 mmol/L (ref 22–32)
Calcium: 9.5 mg/dL (ref 8.9–10.3)
Creatinine, Ser: 0.68 mg/dL (ref 0.44–1.00)
GFR calc Af Amer: >60 mL/min (ref >60)
GFR calc non Af Amer: >60 mL/min (ref >60)
GLUCOSE: 90 mg/dL (ref 65–99)
POTASSIUM: 3.3 mmol/L — AB (ref 3.5–5.1)
Sodium: 139 mmol/L (ref 135–145)

## 2016-07-29 LAB — POCT I-STAT 4, (NA,K, GLUC, HGB,HCT)
GLUCOSE: 92 mg/dL (ref 65–99)
HEMATOCRIT: 40 % (ref 36.0–46.0)
Hemoglobin: 13.6 g/dL (ref 12.0–15.0)
POTASSIUM: 3.4 mmol/L — AB (ref 3.5–5.1)
Sodium: 141 mmol/L (ref 135–145)

## 2016-07-29 LAB — PROTIME-INR
INR: 0.91
Prothrombin Time: 12.2 seconds (ref 11.4–15.2)

## 2016-07-29 LAB — APTT: aPTT: 30 seconds (ref 24–36)

## 2016-07-29 SURGERY — NEPHROLITHOTOMY PERCUTANEOUS
Anesthesia: General | Wound class: Clean

## 2016-07-29 MED ORDER — DEXAMETHASONE SODIUM PHOSPHATE 10 MG/ML IJ SOLN
INTRAMUSCULAR | Status: AC
  Filled 2016-07-29: qty 1

## 2016-07-29 MED ORDER — BELLADONNA ALKALOIDS-OPIUM 16.2-60 MG RE SUPP: 1.0000 | Freq: Four times a day (QID) | RECTAL | Status: DC | PRN

## 2016-07-29 MED ORDER — DEXMEDETOMIDINE HCL IN NACL 200 MCG/50ML IV SOLN
INTRAVENOUS | Status: DC | PRN
  Administered 2016-07-29: 4 ug via INTRAVENOUS

## 2016-07-29 MED ORDER — DEXMEDETOMIDINE HCL IN NACL 200 MCG/50ML IV SOLN
INTRAVENOUS | Status: AC
  Filled 2016-07-29: qty 50

## 2016-07-29 MED ORDER — DEXAMETHASONE SODIUM PHOSPHATE 10 MG/ML IJ SOLN
INTRAMUSCULAR | Status: DC | PRN
  Administered 2016-07-29: 10 mg via INTRAVENOUS

## 2016-07-29 MED ORDER — ROCURONIUM BROMIDE 50 MG/5ML IV SOSY
PREFILLED_SYRINGE | INTRAVENOUS | Status: AC
  Filled 2016-07-29: qty 5

## 2016-07-29 MED ORDER — SUGAMMADEX SODIUM 200 MG/2ML IV SOLN
INTRAVENOUS | Status: AC
  Filled 2016-07-29: qty 2

## 2016-07-29 MED ORDER — PHENYLEPHRINE 40 MCG/ML (10ML) SYRINGE FOR IV PUSH (FOR BLOOD PRESSURE SUPPORT)
PREFILLED_SYRINGE | INTRAVENOUS | Status: AC
  Filled 2016-07-29: qty 10

## 2016-07-29 MED ORDER — HYDROMORPHONE HCL 1 MG/ML IJ SOLN
INTRAMUSCULAR | Status: AC
  Filled 2016-07-29: qty 1

## 2016-07-29 MED ORDER — FENTANYL CITRATE (PF) 100 MCG/2ML IJ SOLN
25.0000 ug | INTRAMUSCULAR | Status: AC | PRN
  Administered 2016-07-29 (×6): 25 ug via INTRAVENOUS

## 2016-07-29 MED ORDER — ROCURONIUM BROMIDE 100 MG/10ML IV SOLN
INTRAVENOUS | Status: DC | PRN
  Administered 2016-07-29: 40 mg via INTRAVENOUS

## 2016-07-29 MED ORDER — OXYBUTYNIN CHLORIDE 5 MG PO TABS
5.0000 mg | ORAL_TABLET | Freq: Three times a day (TID) | ORAL | Status: DC | PRN
  Administered 2016-07-29 – 2016-07-31 (×2): 5 mg via ORAL
  Filled 2016-07-29 (×2): qty 1

## 2016-07-29 MED ORDER — FAMOTIDINE 20 MG PO TABS
20.0000 mg | ORAL_TABLET | Freq: Once | ORAL | Status: AC
  Administered 2016-07-29: 20 mg via ORAL

## 2016-07-29 MED ORDER — OXYCODONE HCL 5 MG PO TABS: 10.0000 mg | ORAL_TABLET | Freq: Four times a day (QID) | ORAL | 0 refills | Status: DC | PRN

## 2016-07-29 MED ORDER — CIPROFLOXACIN IN D5W 400 MG/200ML IV SOLN
INTRAVENOUS | Status: AC
  Filled 2016-07-29: qty 200

## 2016-07-29 MED ORDER — LACTATED RINGERS IV SOLN
INTRAVENOUS | Status: DC
  Administered 2016-07-29: 07:00:00 via INTRAVENOUS

## 2016-07-29 MED ORDER — HEPARIN SODIUM (PORCINE) 5000 UNIT/ML IJ SOLN
5000.0000 [IU] | Freq: Three times a day (TID) | INTRAMUSCULAR | Status: DC
  Administered 2016-07-29 – 2016-08-02 (×11): 5000 [IU] via SUBCUTANEOUS
  Filled 2016-07-29 (×11): qty 1

## 2016-07-29 MED ORDER — ZOLPIDEM TARTRATE 5 MG PO TABS
5.0000 mg | ORAL_TABLET | Freq: Every evening | ORAL | Status: DC | PRN
  Administered 2016-07-29: 5 mg via ORAL
  Filled 2016-07-29: qty 1

## 2016-07-29 MED ORDER — IOTHALAMATE MEGLUMINE 17.2 % UR SOLN
URETHRAL | Status: DC | PRN
  Administered 2016-07-29: 130 mL

## 2016-07-29 MED ORDER — FENTANYL CITRATE (PF) 100 MCG/2ML IJ SOLN
25.0000 ug | INTRAMUSCULAR | Status: DC | PRN
  Administered 2016-07-29: 25 ug via INTRAVENOUS

## 2016-07-29 MED ORDER — ONDANSETRON HCL 4 MG/2ML IJ SOLN
INTRAMUSCULAR | Status: DC | PRN
  Administered 2016-07-29: 4 mg via INTRAVENOUS

## 2016-07-29 MED ORDER — DEXTROSE 5 % IV SOLN
120.0000 mg | INTRAVENOUS | Status: AC
  Administered 2016-07-29: 120 mg via INTRAVENOUS
  Filled 2016-07-29: qty 3

## 2016-07-29 MED ORDER — PHENYLEPHRINE HCL 10 MG/ML IJ SOLN
INTRAMUSCULAR | Status: DC | PRN
  Administered 2016-07-29 (×2): 80 ug via INTRAVENOUS
  Administered 2016-07-29: 120 ug via INTRAVENOUS

## 2016-07-29 MED ORDER — FAMOTIDINE 20 MG PO TABS
ORAL_TABLET | ORAL | Status: AC
  Administered 2016-07-29: 20 mg via ORAL
  Filled 2016-07-29: qty 1

## 2016-07-29 MED ORDER — FENTANYL CITRATE (PF) 100 MCG/2ML IJ SOLN
INTRAMUSCULAR | Status: AC
  Administered 2016-07-29: 25 ug via INTRAVENOUS
  Filled 2016-07-29: qty 2

## 2016-07-29 MED ORDER — FENTANYL CITRATE (PF) 100 MCG/2ML IJ SOLN
INTRAMUSCULAR | Status: AC | PRN
  Administered 2016-07-29 (×6): 25 ug via INTRAVENOUS
  Administered 2016-07-29: 50 ug via INTRAVENOUS

## 2016-07-29 MED ORDER — MIDAZOLAM HCL 5 MG/5ML IJ SOLN
INTRAMUSCULAR | Status: AC | PRN
  Administered 2016-07-29: 1 mg via INTRAVENOUS
  Administered 2016-07-29: 0.5 mg via INTRAVENOUS
  Administered 2016-07-29: 1 mg via INTRAVENOUS
  Administered 2016-07-29 (×5): 0.5 mg via INTRAVENOUS

## 2016-07-29 MED ORDER — MIDAZOLAM HCL 5 MG/5ML IJ SOLN
INTRAMUSCULAR | Status: AC
  Filled 2016-07-29: qty 5

## 2016-07-29 MED ORDER — FENTANYL CITRATE (PF) 100 MCG/2ML IJ SOLN
INTRAMUSCULAR | Status: DC | PRN
  Administered 2016-07-29: 100 ug via INTRAVENOUS

## 2016-07-29 MED ORDER — MIDAZOLAM HCL 2 MG/2ML IJ SOLN
INTRAMUSCULAR | Status: DC | PRN
  Administered 2016-07-29: 1 mg via INTRAVENOUS

## 2016-07-29 MED ORDER — ONDANSETRON HCL 4 MG/2ML IJ SOLN
4.0000 mg | INTRAMUSCULAR | Status: DC | PRN
  Administered 2016-07-31 – 2016-08-02 (×5): 4 mg via INTRAVENOUS
  Filled 2016-07-29 (×5): qty 2

## 2016-07-29 MED ORDER — MIDAZOLAM HCL 2 MG/2ML IJ SOLN
2.0000 mg | Freq: Once | INTRAMUSCULAR | Status: AC
  Administered 2016-07-29: 2 mg via INTRAVENOUS

## 2016-07-29 MED ORDER — SODIUM CHLORIDE 0.9 % IV SOLN
INTRAVENOUS | Status: DC
  Administered 2016-07-29: 17:00:00 via INTRAVENOUS
  Administered 2016-07-30: 125 mL via INTRAVENOUS
  Administered 2016-07-31: 09:00:00 via INTRAVENOUS

## 2016-07-29 MED ORDER — MIDAZOLAM HCL 2 MG/2ML IJ SOLN
INTRAMUSCULAR | Status: AC
  Filled 2016-07-29: qty 2

## 2016-07-29 MED ORDER — LIDOCAINE HCL (PF) 1 % IJ SOLN
INTRAMUSCULAR | Status: AC
  Filled 2016-07-29: qty 30

## 2016-07-29 MED ORDER — PROPOFOL 10 MG/ML IV BOLUS
INTRAVENOUS | Status: DC | PRN
  Administered 2016-07-29: 180 mg via INTRAVENOUS

## 2016-07-29 MED ORDER — CLINDAMYCIN PHOSPHATE 600 MG/50ML IV SOLN
600.0000 mg | Freq: Three times a day (TID) | INTRAVENOUS | Status: AC
  Administered 2016-07-29 – 2016-07-30 (×3): 600 mg via INTRAVENOUS
  Filled 2016-07-29 (×3): qty 50

## 2016-07-29 MED ORDER — CLINDAMYCIN PHOSPHATE 600 MG/50ML IV SOLN
INTRAVENOUS | Status: AC
  Filled 2016-07-29: qty 50

## 2016-07-29 MED ORDER — IOPAMIDOL (ISOVUE-300) INJECTION 61%
30.0000 mL | Freq: Once | INTRAVENOUS | Status: AC | PRN
  Administered 2016-07-29: 15 mL

## 2016-07-29 MED ORDER — OXYCODONE HCL 5 MG PO TABS
10.0000 mg | ORAL_TABLET | Freq: Four times a day (QID) | ORAL | Status: DC | PRN
  Administered 2016-07-29 – 2016-08-03 (×14): 10 mg via ORAL
  Filled 2016-07-29 (×17): qty 2

## 2016-07-29 MED ORDER — MORPHINE SULFATE (PF) 2 MG/ML IV SOLN
2.0000 mg | INTRAVENOUS | Status: DC | PRN
  Administered 2016-07-29 – 2016-08-02 (×26): 4 mg via INTRAVENOUS
  Administered 2016-08-02: 2 mg via INTRAVENOUS
  Administered 2016-08-02 (×3): 4 mg via INTRAVENOUS
  Filled 2016-07-29 (×27): qty 2
  Filled 2016-07-29: qty 1
  Filled 2016-07-29 (×3): qty 2

## 2016-07-29 MED ORDER — MIDAZOLAM HCL 2 MG/2ML IJ SOLN
INTRAMUSCULAR | Status: AC
  Administered 2016-07-29: 2 mg via INTRAVENOUS
  Filled 2016-07-29: qty 2

## 2016-07-29 MED ORDER — ONDANSETRON HCL 4 MG/2ML IJ SOLN
INTRAMUSCULAR | Status: AC
  Filled 2016-07-29: qty 2

## 2016-07-29 MED ORDER — ESTROGENS CONJUGATED 0.45 MG PO TABS
0.4500 mg | ORAL_TABLET | Freq: Every day | ORAL | Status: DC
  Administered 2016-07-29 – 2016-08-02 (×5): 0.45 mg via ORAL
  Filled 2016-07-29 (×5): qty 1

## 2016-07-29 MED ORDER — PROPOFOL 10 MG/ML IV BOLUS
INTRAVENOUS | Status: AC
  Filled 2016-07-29: qty 20

## 2016-07-29 MED ORDER — SUGAMMADEX SODIUM 200 MG/2ML IV SOLN
INTRAVENOUS | Status: DC | PRN
  Administered 2016-07-29: 120 mg via INTRAVENOUS

## 2016-07-29 MED ORDER — FENTANYL CITRATE (PF) 100 MCG/2ML IJ SOLN
INTRAMUSCULAR | Status: AC
  Filled 2016-07-29: qty 4

## 2016-07-29 MED ORDER — LIDOCAINE 2% (20 MG/ML) 5 ML SYRINGE
INTRAMUSCULAR | Status: AC
  Filled 2016-07-29: qty 5

## 2016-07-29 MED ORDER — EPHEDRINE SULFATE 50 MG/ML IJ SOLN
INTRAMUSCULAR | Status: DC | PRN
  Administered 2016-07-29: 5 mg via INTRAVENOUS

## 2016-07-29 MED ORDER — LIDOCAINE HCL (PF) 1 % IJ SOLN
INTRAMUSCULAR | Status: AC | PRN
  Administered 2016-07-29: 10 mL

## 2016-07-29 MED ORDER — LIDOCAINE HCL (CARDIAC) 20 MG/ML IV SOLN
INTRAVENOUS | Status: DC | PRN
  Administered 2016-07-29: 80 mg via INTRAVENOUS

## 2016-07-29 MED ORDER — HYDROMORPHONE HCL 1 MG/ML IJ SOLN
0.5000 mg | INTRAMUSCULAR | Status: AC | PRN
  Administered 2016-07-29 (×4): 0.5 mg via INTRAVENOUS

## 2016-07-29 MED ORDER — FENTANYL CITRATE (PF) 100 MCG/2ML IJ SOLN
INTRAMUSCULAR | Status: AC
  Administered 2016-07-29: 50 ug via INTRAVENOUS
  Filled 2016-07-29: qty 2

## 2016-07-29 MED ORDER — CLINDAMYCIN PHOSPHATE 600 MG/50ML IV SOLN
600.0000 mg | INTRAVENOUS | Status: AC
Start: 2016-07-29 — End: 2016-07-29
  Administered 2016-07-29: 600 mg via INTRAVENOUS

## 2016-07-29 MED ORDER — PROMETHAZINE HCL 25 MG/ML IJ SOLN: 6.2500 mg | INTRAMUSCULAR | Status: DC | PRN

## 2016-07-29 MED ORDER — EPHEDRINE 5 MG/ML INJ
INTRAVENOUS | Status: AC
  Filled 2016-07-29: qty 10

## 2016-07-29 MED ORDER — MIDAZOLAM HCL 5 MG/5ML IJ SOLN
INTRAMUSCULAR | Status: DC
Start: 2016-07-29 — End: 2016-07-29
  Filled 2016-07-29: qty 5

## 2016-07-29 MED ORDER — ONDANSETRON HCL 4 MG/2ML IJ SOLN: 4.0000 mg | Freq: Once | INTRAMUSCULAR | Status: DC | PRN

## 2016-07-29 MED ORDER — FENTANYL CITRATE (PF) 250 MCG/5ML IJ SOLN
INTRAMUSCULAR | Status: AC
  Filled 2016-07-29: qty 5

## 2016-07-29 MED ORDER — DOCUSATE SODIUM 100 MG PO CAPS
100.0000 mg | ORAL_CAPSULE | Freq: Two times a day (BID) | ORAL | Status: DC
  Administered 2016-07-30 – 2016-08-02 (×8): 100 mg via ORAL
  Filled 2016-07-29 (×8): qty 1

## 2016-07-29 MED ORDER — FENTANYL CITRATE (PF) 100 MCG/2ML IJ SOLN
50.0000 ug | Freq: Once | INTRAMUSCULAR | Status: AC
  Administered 2016-07-29: 50 ug via INTRAVENOUS

## 2016-07-29 MED ORDER — FENTANYL CITRATE (PF) 100 MCG/2ML IJ SOLN
INTRAMUSCULAR | Status: AC
  Filled 2016-07-29: qty 2

## 2016-07-29 MED ORDER — CIPROFLOXACIN IN D5W 400 MG/200ML IV SOLN
400.0000 mg | INTRAVENOUS | Status: AC
  Administered 2016-07-29: 400 mg via INTRAVENOUS

## 2016-07-29 SURGICAL SUPPLY — 52 items
ADAPTER IRRIG TUBE 2 SPIKE SOL (ADAPTER) ×8 IMPLANT
BAG URO DRAIN 2000ML W/SPOUT (MISCELLANEOUS) ×4 IMPLANT
BALLN NEPHROMAX 30X10X12 (MISCELLANEOUS) ×4
BALLOON NEPHROMAX 30X10X12 (MISCELLANEOUS) ×2 IMPLANT
BLADE SURG 15 STRL LF DISP TIS (BLADE) ×2 IMPLANT
BLADE SURG 15 STRL SS (BLADE) ×2
CATH COUNCIL 22FR (CATHETERS) IMPLANT
CATH FOL LEG HOLDER (MISCELLANEOUS) ×4 IMPLANT
CATH FOLEY 2W COUNCIL 20FR 5CC (CATHETERS) ×4 IMPLANT
CATH FOLEY 2W COUNCIL 5CC 18FR (CATHETERS) IMPLANT
CATH TRAY 16F METER LATEX (MISCELLANEOUS) ×4 IMPLANT
CATH URETL 5X70 OPEN END (CATHETERS) ×4 IMPLANT
CHLORAPREP W/TINT 26ML (MISCELLANEOUS) ×4 IMPLANT
CNTNR SPEC 2.5X3XGRAD LEK (MISCELLANEOUS) ×2
CONRAY 43 FOR UROLOGY 50M (MISCELLANEOUS) ×12 IMPLANT
CONT SPEC 4OZ STER OR WHT (MISCELLANEOUS) ×2
CONTAINER SPEC 2.5X3XGRAD LEK (MISCELLANEOUS) ×2 IMPLANT
DRAPE C-ARM XRAY 36X54 (DRAPES) ×4 IMPLANT
DRAPE SHEET LG 3/4 BI-LAMINATE (DRAPES) ×4 IMPLANT
GAUZE SPONGE 4X4 12PLY STRL (GAUZE/BANDAGES/DRESSINGS) ×4 IMPLANT
GLOVE BIO SURGEON STRL SZ7 (GLOVE) ×8 IMPLANT
GOWN STRL REUS W/ TWL LRG LVL3 (GOWN DISPOSABLE) ×4 IMPLANT
GOWN STRL REUS W/TWL LRG LVL3 (GOWN DISPOSABLE) ×4
GUIDEWIRE GREEN .038 145CM (MISCELLANEOUS) IMPLANT
GUIDEWIRE INTRO SET STRAIGHT (WIRE) ×4 IMPLANT
GUIDEWIRE STR ZIPWIRE 035X150 (MISCELLANEOUS) ×4 IMPLANT
GUIDEWIRE SUPER STIFF .035X180 (WIRE) ×8 IMPLANT
INTRODUCER DILATOR DOUBLE (INTRODUCER) ×4 IMPLANT
MANIFOLD NEPTUNE II (INSTRUMENTS) ×4 IMPLANT
NDL FASCIA INCISION 18GA (NEEDLE) ×4 IMPLANT
PACK BASIN MINOR ARMC (MISCELLANEOUS) ×4 IMPLANT
PROBE CYBERWAND SET (MISCELLANEOUS) ×4 IMPLANT
SENSORWIRE 0.038 NOT ANGLED (WIRE) ×8
SET IRRIG Y TYPE TUR BLADDER L (SET/KITS/TRAYS/PACK) ×4 IMPLANT
SET IRRIGATING DISP (SET/KITS/TRAYS/PACK) ×4 IMPLANT
SHEET NEURO XL SOL CTL (MISCELLANEOUS) ×4 IMPLANT
SOL .9 NS 3000ML IRR  AL (IV SOLUTION) ×8
SOL .9 NS 3000ML IRR UROMATIC (IV SOLUTION) ×8 IMPLANT
SPONGE DRAIN TRACH 4X4 STRL 2S (GAUZE/BANDAGES/DRESSINGS) ×4 IMPLANT
STENT URET 6FRX24 CONTOUR (STENTS) IMPLANT
STENT URET 6FRX26 CONTOUR (STENTS) ×4 IMPLANT
SUT SILK 0 SH 30 (SUTURE) ×4 IMPLANT
SYR 20CC LL (SYRINGE) ×4 IMPLANT
SYR 30ML LL (SYRINGE) ×4 IMPLANT
SYRINGE 10CC LL (SYRINGE) ×4 IMPLANT
SYRINGE IRR TOOMEY STRL 70CC (SYRINGE) ×4 IMPLANT
TAPE MICROFOAM 4IN (TAPE) IMPLANT
TRAP SPECIMEN MUCOUS 40CC (MISCELLANEOUS) IMPLANT
TUBING CONNECTING 10 (TUBING) ×3 IMPLANT
TUBING CONNECTING 10' (TUBING) ×1
WATER STERILE IRR 1000ML POUR (IV SOLUTION) IMPLANT
WIRE SENSOR 0.038 NOT ANGLED (WIRE) ×4 IMPLANT

## 2016-07-29 NOTE — Transfer of Care (Signed)
Immediate Anesthesia Transfer of Care Note  Patient: [name]  Procedure(s) Performed: Procedure(s): NEPHROLITHOTOMY PERCUTANEOUS (Left)  Patient Location: PACU  Anesthesia Type:General  Level of Consciousness: sedated  Airway & Oxygen Therapy: Patient Spontanous Breathing  Post-op Assessment: Report given to RN and Post -op Vital signs reviewed and stable  Post vital signs: Reviewed and stable  Last Vitals:  Vitals:   07/29/16 1051 07/29/16 1307  BP: (!) 134/96 114/71  Pulse: 99 95  Resp: 18 20  Temp:  36.1 C    Last Pain:  Vitals:   07/29/16 1307  TempSrc:   PainSc: Asleep      Patients Stated Pain Goal: 2 (07/29/16 16100634)  Complications: No apparent anesthesia complications
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
## 2016-07-29 NOTE — OR Nursing (Signed)
Patient received back from [facility] in stable condition. Vs. T 98.0 tympanic, HR 87 resp. 16 BP 108/91.
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
## 2016-07-29 NOTE — H&P (Signed)
Teresa Davenport 06/08/1966 [id number]  Referring provider: Pricilla HolmLeslie M Sharpe, MD [address] [place], [place] [id number]      Chief Complaint  Patient presents with  . Follow-up    Nephrolithiasis    HPI: The patient is a 51 year old female with a past medical history of nephrolithiasis who presents today for follow-up. She recently underwent emergent stent placement for a right ureteral stone in the setting of sepsis. She also had it sewn appropriately treated. Her post instrumentation renal ultrasound was reviewed today and show no evidence of hydronephrosis.  She does have a residual approximately 1.5 cm stone in the left lower pole as well as a 5 mm stone in the left upper pole. She returns today to further discuss management of this stone.     Her stone wasa 15% calcium oxalate dihydrate, 35% calcium oxalate monohydrate, 30% calcium phosphate.  She's had multiple stones before. She has undergone percutaneous nephrolithotomy in the past. [facility] though she is unaware of the results.  PMH:     Past Medical History:  Diagnosis Date  . Breast mass 05/22/2012  . Chronic cystitis 10/15/2014  . Chronic pain 05/31/2012  . Gross hematuria 09/17/2014  . History of kidney stones   . Hydronephrosis with urinary obstruction due to ureteral calculus 04/29/2016  . Hypokalemia 03/23/2016  . Kidney stone   . Pancreatitis   . Pulmonary emphysema (HCC) 10/17/2012   Overview:  IMO routine update  . Pyelonephritis 04/17/2016  . Renal colic 09/17/2014  . Renal disorder   . Rib fracture   . Ureterolithiasis 04/29/2016    Surgical History:      Past Surgical History:  Procedure Laterality Date  . ABDOMINAL HYSTERECTOMY    . BREAST SURGERY    . CLAVICLE SURGERY    . CYSTOSCOPY W/ URETERAL STENT PLACEMENT Right 05/11/2016   Procedure: CYSTOSCOPY WITH STENT REPLACEMENT;  Surgeon: Hildred LaserBrian James Taleigh Gero, MD;  Location: [facility]  ORS;  Service: Urology;  Laterality: Right;  . CYSTOSCOPY WITH STENT PLACEMENT N/A 04/16/2016   Procedure: CYSTOSCOPY WITH STENT PLACEMENT and retrograde pyelogram;  Surgeon: Zonia Kiefaniel Wollin, MD;  Location: [facility] ORS;  Service: Urology;  Laterality: N/A;  . FACIAL FRACTURE SURGERY     baseball accident- pins placed  . FEMUR FRACTURE SURGERY    . FRACTURE SURGERY    . RIB FRACTURE SURGERY  08/2015   pins and screws  . URETEROSCOPY WITH HOLMIUM LASER LITHOTRIPSY Right 05/11/2016   Procedure: URETEROSCOPY WITH HOLMIUM LASER LITHOTRIPSY;  Surgeon: Hildred LaserBrian James Eliceo Gladu, MD;  Location: [facility] ORS;  Service: Urology;  Laterality: Right;    Home Medications:        Medication List           Accurate as of 06/16/16 11:29 AM. Always use your most recent med list.           acetaminophen 500 MG tablet Commonly known as:  TYLENOL Take 500 mg by mouth every 6 (six) hours as needed.   estrogens (conjugated) 0.45 MG tablet Commonly known as:  PREMARIN Take 0.45 mg by mouth daily. Take daily for 21 days then do not take for 7 days.   HYDROcodone-acetaminophen 5-325 MG tablet Commonly known as:  NORCO Take 1-2 tablets by mouth every 4 (four) hours as needed for moderate pain.   tamsulosin 0.4 MG Caps capsule Commonly known as:  FLOMAX Take 1 capsule (0.4 mg total) by mouth daily.  Allergies:       Allergies  Allergen Reactions  . Meropenem Shortness Of Breath  . Doxycycline Swelling  . Motrin [Ibuprofen] Swelling  . Penicillins Swelling    Has patient had a PCN reaction causing immediate rash, facial/tongue/throat swelling, SOB or lightheadedness with hypotension: Yes Has patient had a PCN reaction causing severe rash involving mucus membranes or skin necrosis: No Has patient had a PCN reaction that required hospitalization No Has patient had a PCN reaction occurring within the last 10 years: No If all of the above answers are "NO", then may proceed with  Cephalosporin use.  . Tape     Patient says she cannot tolerate any tape but tegaderm  . Toradol [Ketorolac Tromethamine] Swelling  . Tramadol Swelling  . Amoxicillin Rash  . Ketorolac Rash  . Methadone Rash  . Naproxen Sodium Rash    Family History:      Family History  Problem Relation Age of Onset  . Kidney disease Mother   . Pancreatic cancer Father   . Bladder Cancer Neg Hx   . Kidney cancer Neg Hx   . Prostate cancer Neg Hx     Social History:  reports that she has been smoking Cigarettes.  She has been smoking about 0.25 packs per day. She has never used smokeless tobacco. She reports that she does not drink alcohol or use drugs.  ROS: UROLOGY Frequent Urination?: No Hard to postpone urination?: No Burning/pain with urination?: No Get up at night to urinate?: No Leakage of urine?: No Urine stream starts and stops?: No Trouble starting stream?: No Do you have to strain to urinate?: No Blood in urine?: Yes Urinary tract infection?: No Sexually transmitted disease?: No Injury to kidneys or bladder?: No Painful intercourse?: No Weak stream?: No Currently pregnant?: No Vaginal bleeding?: No Last menstrual period?: hysterectomy  Gastrointestinal Nausea?: No Vomiting?: No Indigestion/heartburn?: No Diarrhea?: No Constipation?: No  Constitutional Fever: No Night sweats?: No Weight loss?: No Fatigue?: No  Skin Skin rash/lesions?: No Itching?: No  Eyes Blurred vision?: No Double vision?: No  Ears/Nose/Throat Sore throat?: No Sinus problems?: No  Hematologic/Lymphatic Swollen glands?: No Easy bruising?: No  Cardiovascular Leg swelling?: No Chest pain?: No  Respiratory Cough?: No Shortness of breath?: No  Endocrine Excessive thirst?: No  Musculoskeletal Back pain?: No Joint pain?: Yes  Neurological Headaches?: Yes Dizziness?: No  Psychologic Depression?: No Anxiety?: Yes  Physical Exam: There were no  vitals taken for this visit.  Constitutional:  Alert and oriented, No acute distress. HEENT: [place] AT, moist mucus membranes.  Trachea midline, no masses. Cardiovascular: No clubbing, cyanosis, or edema. RRR. Respiratory: Normal respiratory effort, no increased work of breathing. Lungs clear GI: Abdomen is soft, nontender, nondistended, no abdominal masses GU: No CVA tenderness.  Skin: No rashes, bruises or suspicious lesions. Lymph: No cervical or inguinal adenopathy. Neurologic: Grossly intact, no focal deficits, moving all 4 extremities. Psychiatric: Normal mood and affect.  Laboratory Data: RecentLabs       Lab Results  Component Value Date   WBC 9.9 04/17/2016   HGB 11.4 (L) 04/17/2016   HCT 32.4 (L) 04/17/2016   MCV 91.8 04/17/2016   PLT 188 04/17/2016      RecentLabs       Lab Results  Component Value Date   CREATININE 0.65 04/18/2016      RecentLabs  No results found for: PSA    RecentLabs  No results found for: TESTOSTERONE    RecentLabs  No results found  for: HGBA1C    Urinalysis Labs(Brief)          Component Value Date/Time   COLORURINE RED (A) 04/18/2016 0937   APPEARANCEUR Cloudy (A) 05/19/2016 1348   LABSPEC 1.011 04/18/2016 0937   LABSPEC 1.004 12/20/2013 1115   PHURINE 6.0 04/18/2016 0937   GLUCOSEU Negative 05/19/2016 1348   GLUCOSEU Negative 12/20/2013 1115   HGBUR 3+ (A) 04/18/2016 0937   BILIRUBINUR Negative 05/19/2016 1348   BILIRUBINUR Negative 12/20/2013 1115   KETONESUR NEGATIVE 04/18/2016 0937   PROTEINUR 2+ (A) 05/19/2016 1348   PROTEINUR 100 (A) 04/18/2016 0937   NITRITE Negative 05/19/2016 1348   NITRITE NEGATIVE 04/18/2016 0937   LEUKOCYTESUR 2+ (A) 05/19/2016 1348   LEUKOCYTESUR Negative 12/20/2013 1115      Pertinent Imaging: U/S and CT reviewed.  Assessment & Plan:    1. Left renal stone I discussed with the patient her relatively large left lower pole stone. Due to its  size and lower pole location, we discussed treatment options which include ureteroscopy and percutaneous nephrolithotomy. Given this location and size, she has the best chance of being stone free with percutaneous nephrolithotomy. We also did discuss ureteroscopy which she is elected to proceed with nephrolithotomy. She understands the risks, benefits, and indications of the procedure. She has been through this before. She understands the risks include bleeding, transfusion, infection, need for repeat procedures, iatrogenic injury, and renal loss. She also understands that she'll wake up with a Foley catheter and nephrostomy tube and ureteral stent. She'll be in the hospital overnight. Ideally, she will have this catheter ends nephrostomy tube removed prior to discharge from the hospital. She will go home with the stent. All questions were answered, the patient has elected to proceed.   Hildred Laser, MD  [facility] [address] [place], [place] [address] [phone]

## 2016-07-29 NOTE — Progress Notes (Signed)
Pt stated severe pain after 150 mcg of Fentanyl and 2 mg of Dilaudid. Pt requested for catheter to come out and this RN spoke with Dr. Sherryl BartersBudzyn and he declined the request. Dr. Pernell DupreAdams notified and new orders for Fentanyl and Versed received. Adequate urine noted from both urinary catheter and nephrostomy tube. Area around nephrostomy tube not to be pink and dry. Teresa Davenport E 4:18 PM 07/29/2016

## 2016-07-29 NOTE — Anesthesia Post-op Follow-up Note (Cosign Needed)
Anesthesia QCDR form completed.        

## 2016-07-29 NOTE — Op Note (Signed)
Date of procedure: 07/29/16  Preoperative diagnosis:  1. Left renal stone  Postoperative diagnosis:  1. Left renal stone   Procedure: 1. Left percutaneous nephrolithotomy 2. Left antegrade ureteral stent placement 6 French by 26 cm 3. Left nephrostomy tube placement  Surgeon: Baruch Gouty, MD  Anesthesia: General  Complications: None  Intraoperative findings: The patient had a cluster of stones in the left lower pole that removed. Some of the smaller stones were washed away down the ureter all attempts were made to grab them. There is no obvious stones on pan nephroscopy the end of the procedure.  EBL: 20 cc  Specimens: Left renal stone  Drains: 53 French Foley catheter, left 6 [place] by 26 cm double-J ureteral stent, and 20 French nephrostomy tube  Disposition: Stable to the postanesthesia care unit  Indication for procedure: The patient is a 51 y.o. female with over 1.5 cm of stone burden in her left lower pole after discussing options the patient has elected to undergo left percutaneous nephrolithotomy.  After reviewing the management options for treatment, the patient elected to proceed with the above surgical procedure(s). We have discussed the potential benefits and risks of the procedure, side effects of the proposed treatment, the likelihood of the patient achieving the goals of the procedure, and any potential problems that might occur during the procedure or recuperation. Informed consent has been obtained.  Description of procedure: The patient was met in the preoperative area. All risks, benefits, and indications of the procedure were described in great detail. The patient consented to the procedure. Preoperative antibiotics were given. The patient was taken to the operative theater. General anesthesia was induced per the anesthesia service. A foley catheter was placed The patient was then placed in the prone position and all pressure points were adequately padded. She  was then prepped and draped in the usual sterile fashion.  The previously placed left nephroureteral stent was prepped into the surgical field.  A preoperative timeout was called.   Through the previously placed left nephroureteral stent a superstiff wire was advanced to the bladder under fluoroscopy and the nephroureteral stent was removed. With the aid of a dual lumen catheter a sensor wire was then placed alongside the superstiff guidewire and advanced to the level of the bladder under fluoroscopy. An incision was made 1 cm in size where the wires exited the skin. The access sheath balloon dilator was then advanced under fluoroscopy after obtaining an antegrade pyelogram into the renal pelvis. The balloon was inflated to 18 mmHg under fluoroscopy. It was left inflated for 10 minutes to achieve hemostasis. The access sheath was advanced over the balloon under fluoroscopy into the renal pelvis and the balloon was deflated and removed. Both wires this point were secured. The nephroscope was introduced into the patient's left lower pole. In the left lower pole there was a large cluster of stones that was over 1/2 cm in complete length. The largest stones were grafts with graspers. The smaller ones were suctioned with the lithotripter. Some of the smaller ones did wash away down the left ureter. There is no further stones identified in the lower pole. The cystoscope was then inserted into the access sheath and pan nephroscopy took place. No further stones were seen. Antegrade pyelogram was obtained to ensure that all calyces were inspected. Again this revealed no further stone burden outside of the large volume of stone that was seen in the beginning of the procedure. At this point with the aid of the  nephroscope a 6 [place] by 26 cm antegrade right ureteral stent was placed over the super stiff guidewire. The superstiff guidewire was removed. A curl seen in the patient's bladder on fluoroscopy in the renal pelvis  under direct localization. At this point hemostasis was excellent the access sheath was removed. Over the remaining sensor wire a 20 French [place] catheter was advanced under fluoroscopy. An antegrade pyelogram was obtained to ensure the catheter was placed within the renal pelvis. 3 cc were then placed in the balloon. The sensor wire was removed. The catheter was then fixed to the skin with a drain stitch. There was clear pink urine output from this nephrostomy tube. The patient was awoken from anesthesia and transferred in stable condition to the post anesthesia care unit.  Plan: The patient will be admitted to the floor overnight. We'll remove her nephrostomy tube and Foley catheter prior to discharge tomorrow. We will obtain a KUB prior to discharge home tomorrow. We'll see her back in 1-2 weeks for stent removal after discharge.  Baruch Gouty, M.D.

## 2016-07-29 NOTE — Procedures (Signed)
Interventional Radiology Procedure Note  Procedure: Left percutaneous ureteral catheter placment  Complications: None  Estimated Blood Loss: < 10 mL  After LP access of left kidney at level of renal calculus, 5 Fr catheter advanced down ureter and into bladder.  For OR PCNL with Dr. Sherryl BartersBudzyn to follow.  Jodi MarbleGlenn T. Fredia SorrowYamagata, M.D Pager:  [phone]

## 2016-07-29 NOTE — Progress Notes (Signed)
Report called to SDS and staff transporting to OR. Pt. C/o 10:10 pain. Reported to SDS.  Sister called to update on status.

## 2016-07-29 NOTE — OR Nursing (Signed)
Patient transported to specials for first procedure.

## 2016-07-29 NOTE — Anesthesia Procedure Notes (Signed)
Procedure Name: Intubation Date/Time: 07/29/2016 11:06 AM Performed by: Hedda Slade Pre-anesthesia Checklist: Patient identified, Emergency Drugs available, Suction available and Patient being monitored Patient Re-evaluated:Patient Re-evaluated prior to inductionOxygen Delivery Method: Circle system utilized Preoxygenation: Pre-oxygenation with 100% oxygen Intubation Type: IV induction Ventilation: Mask ventilation without difficulty Laryngoscope Size: Mac and 3 Grade View: Grade I Tube type: Oral Tube size: 7.0 mm Number of attempts: 1 Airway Equipment and Method: Stylet Placement Confirmation: ETT inserted through vocal cords under direct vision,  positive ETCO2 and breath sounds checked- equal and bilateral Secured at: 21 cm Tube secured with: Tape Dental Injury: Teeth and Oropharynx as per pre-operative assessment

## 2016-07-29 NOTE — Telephone Encounter (Signed)
Gave app to patient's mother

## 2016-07-29 NOTE — Progress Notes (Signed)
Pt arrived to unit from PACU moaning and yelling in pain. Morphine given but pt states it is ineffective. Will also try p.o oxycodone and spasm medications to relieve pain. Pt also threatening to remove catheter on her own if we do not remove it for her. MD Sherryl BartersBudzyn stated that he spoke with pt at length about the need for her foley catheter overnight. Pt verbally aggressive and agitated when I informed her of this but I explained the benefits and possible consequences of removing the catheter at this point. She still threatens to remove it, her sister is at bedside helping staff to calm her and help provide relaxation. Pt also asking for multiple drugs such as fentanyl. MD stated that pt has drug abuse history and exemplifies extreme drug seeking behaviors and that would should only give what is ordered and monitor closely.

## 2016-07-30 ENCOUNTER — Inpatient Hospital Stay: Payer: Self-pay

## 2016-07-30 LAB — CBC
HCT: 38.7 % (ref 35.0–47.0)
Hemoglobin: 13.2 g/dL (ref 12.0–16.0)
MCH: 31.5 pg (ref 26.0–34.0)
MCHC: 34.1 g/dL (ref 32.0–36.0)
MCV: 92.4 fL (ref 80.0–100.0)
PLATELETS: 235 10*3/uL (ref 150–440)
RBC: 4.19 MIL/uL (ref 3.80–5.20)
RDW: 13.6 % (ref 11.5–14.5)
WBC: 13.4 10*3/uL — ABNORMAL HIGH (ref 3.6–11.0)

## 2016-07-30 LAB — BASIC METABOLIC PANEL
Anion gap: 9 (ref 5–15)
BUN: 12 mg/dL (ref 6–20)
CALCIUM: 9.7 mg/dL (ref 8.9–10.3)
CO2: 25 mmol/L (ref 22–32)
CREATININE: 0.62 mg/dL (ref 0.44–1.00)
Chloride: 104 mmol/L (ref 101–111)
GFR calc Af Amer: >60 mL/min (ref >60)
GFR calc non Af Amer: >60 mL/min (ref >60)
GLUCOSE: 86 mg/dL (ref 65–99)
Potassium: 3.9 mmol/L (ref 3.5–5.1)
Sodium: 138 mmol/L (ref 135–145)

## 2016-07-30 MED ORDER — MORPHINE SULFATE (PF) 2 MG/ML IV SOLN: 2.0000 mg | INTRAVENOUS | Status: DC | PRN

## 2016-07-30 NOTE — Anesthesia Postprocedure Evaluation (Signed)
Anesthesia Post Note  Patient: Teresa Davenport  Procedure(s) Performed: Procedure(s) (LRB): NEPHROLITHOTOMY PERCUTANEOUS (Left)  Patient location during evaluation: PACU Anesthesia Type: General Level of consciousness: awake and alert Pain management: pain level controlled Vital Signs Assessment: post-procedure vital signs reviewed and stable Respiratory status: spontaneous breathing, nonlabored ventilation, respiratory function stable and patient connected to nasal cannula oxygen Cardiovascular status: blood pressure returned to baseline and stable Postop Assessment: no signs of nausea or vomiting Anesthetic complications: no     Last Vitals:  Vitals:   07/30/16 0517 07/30/16 0758  BP: 114/64 (!) 103/55  Pulse: 86 93  Resp:    Temp:  36.8 C    Last Pain:  Vitals:   07/30/16 0900  TempSrc:   PainSc: 10-Worst pain ever                 Jourden Gilson S

## 2016-07-30 NOTE — Progress Notes (Signed)
1 Day Post-Op  Subjective: Patient doing well although notes flank pain consistent with recent PCNL. No fevers/chills/nausea/vomiting.  Objective: Vital signs in last 24 hours: Temp:  [97 F (36.1 C)-98.3 F (36.8 C)] 98.3 F (36.8 C) (01/20 0758) Pulse Rate:  [78-104] 93 (01/20 0758) Resp:  [8-27] 19 (01/20 0516) BP: (86-118)/(47-81) 103/55 (01/20 0758) SpO2:  [94 %-100 %] 100 % (01/20 0758) Weight:  [61.2 kg (135 lb)] 61.2 kg (135 lb) (01/19 1711)    Intake/Output from previous day: 01/19 0701 - 01/20 0700 In: 1406.2 [I.V.:1406.2] Out: 2555 [Urine:2550; Blood:5] Intake/Output this shift: No intake/output data recorded.  General appearance: alert, cooperative and appears stated age Head: Normocephalic, without obvious abnormality, atraumatic Back: left PCNL site with PCN in place draining light pink urine Resp: breathing normally GI: soft, non-tender; bowel sounds normal; no masses,  no organomegaly Extremities: extremities normal, atraumatic, no cyanosis or edema Skin: Skin color, texture, turgor normal. No rashes or lesions Neurologic: Grossly normal  Lab Results:   Recent Labs  07/29/16 0634 07/29/16 0642 07/30/16 0657  WBC 6.5  --  13.4*  HGB 13.9 13.6 13.2  HCT 40.3 40.0 38.7  PLT 245  --  235   BMET  Recent Labs  07/29/16 0634 07/29/16 0642 07/30/16 0657  NA 139 141 138  K 3.3* 3.4* 3.9  CL 101  --  104  CO2 29  --  25  GLUCOSE 90 92 86  BUN 11  --  12  CREATININE 0.68  --  0.62  CALCIUM 9.5  --  9.7   PT/INR  Recent Labs  07/29/16 0634  LABPROT 12.2  INR 0.91   ABG No results for input(s): PHART, HCO3 in the last 72 hours.  Invalid input(s): PCO2, PO2  Studies/Results: Dg Abd 1 View  Result Date: 07/30/2016 CLINICAL DATA:  Nephrostomy tube placement EXAM: ABDOMEN - 1 VIEW COMPARISON:  None. FINDINGS: A nephrostomy tube projects over the left kidney. A left ureteral stent is identified. There is a calcification near the distal aspect  of the ureteral stent suspicious for a ureteral stone. No other abnormalities are identified. IMPRESSION: Left nephrostomy tube projected over the left kidney with a left ureteral stent. Probable 3 mm stone adjacent to the distal left ureteral stent. Electronically Signed   By: Gerome Samavid  Williams III M.D   On: 07/30/2016 09:03   Dg C-arm 61-120 Min  Result Date: 07/29/2016 CLINICAL DATA:  Left-sided percutaneous nephrolithotomy. EXAM: DG NEPHROSTOGRAM EXISTING ACCESS LEFT; DG C-ARM 61-120 MIN COMPARISON:  Imaging during left renal access and nephroureteral catheter placement earlier today. FINDINGS: Imaging obtained intraoperatively demonstrates opacification of the collecting system, placement of large caliber sheath and removal of lower pole calculi in the left renal collecting system. A ureteral stent was placed extending from the renal pelvis into the bladder and a large caliber nephrostomy tube is present on completion. IMPRESSION: Imaging obtained during left percutaneous nephrolithotomy procedure to remove lower pole calculi. Electronically Signed   By: Irish LackGlenn  Yamagata M.D.   On: 07/29/2016 14:43   Dg Nephrostogram Left Thru Existing Access  Result Date: 07/29/2016 CLINICAL DATA:  Left-sided percutaneous nephrolithotomy. EXAM: DG NEPHROSTOGRAM EXISTING ACCESS LEFT; DG C-ARM 61-120 MIN COMPARISON:  Imaging during left renal access and nephroureteral catheter placement earlier today. FINDINGS: Imaging obtained intraoperatively demonstrates opacification of the collecting system, placement of large caliber sheath and removal of lower pole calculi in the left renal collecting system. A ureteral stent was placed extending from the renal pelvis into  the bladder and a large caliber nephrostomy tube is present on completion. IMPRESSION: Imaging obtained during left percutaneous nephrolithotomy procedure to remove lower pole calculi. Electronically Signed   By: Irish Lack M.D.   On: 07/29/2016 14:43   Ir  Nephrostomy Placement Left  Result Date: 07/29/2016 INDICATION: Lower pole calculus of left kidney requiring percutaneous access prior to planned operative percutaneous nephrolithotomy procedure. EXAM: IR NEPHROSTOMY PLACEMENT LEFT COMPARISON:  CT of the abdomen and pelvis on 04/17/2016 MEDICATIONS: Ciprofloxacin 400 mg IV; The antibiotic was administered in an appropriate time frame prior to skin puncture. ANESTHESIA/SEDATION: Fentanyl 200 mcg IV; Versed 5.0 mg IV Moderate Sedation Time:  34 minutes. The patient was continuously monitored during the procedure by the interventional radiology nurse under my direct supervision. CONTRAST:  15mL ISOVUE-300 IOPAMIDOL (ISOVUE-300) INJECTION 61% - administered into the collecting system(s) FLUOROSCOPY TIME:  Fluoroscopy Time: 8.0 minutes; (35 mGy). COMPLICATIONS: None immediate. PROCEDURE: Informed written consent was obtained from the patient after a thorough discussion of the procedural risks, benefits and alternatives. All questions were addressed. Maximal Sterile Barrier Technique was utilized including caps, mask, sterile gowns, sterile gloves, sterile drape, hand hygiene and skin antiseptic. A timeout was performed prior to the initiation of the procedure. In a prone position, the left flank region was prepped and draped. Fluoroscopy was used to localize a left lower pole renal calculus. Ultrasound was used to localize the kidney. Under direct ultrasound guidance, a 22 gauge needle was advanced into the upper pole collecting system. This was used to opacify the collecting system with contrast. A separate 21 gauge needle was then used to directly puncture a lower pole calyx under fluoroscopic guidance. After contrast injection, a guidewire was advanced into the collecting system. A transitional dilator was placed. Over a guidewire, a 5 [place] Kumpe catheter was advanced into the renal collecting system and further advanced into the ureter and to the level of the  bladder. The catheter was capped and secured at the skin with a silk retention suture and overlying dressing. FINDINGS: Calculus occupying the lower pole collecting system of the left kidney is visible by fluoroscopy. Initial contrast injection was performed after upper pole access to increase yielded of accessing the lower pole. This contrast injection demonstrates normally patent flow into the central collecting system and ureter. After lower pole access at the level of the calculus, a guidewire and catheter were able to be eventually advanced into the collecting system and down the ureter. Final catheter positioning is such that the tip lies just in the bladder. IMPRESSION: Left percutaneous nephrostomy access at the level of a lower pole calculus with eventual advancement of a ureteral catheter into the bladder. This access will be utilized during operative nephrolithotomy to be performed later today by Dr. Sherryl Barters. Electronically Signed   By: Irish Lack M.D.   On: 07/29/2016 10:21    Anti-infectives: Anti-infectives    Start     Dose/Rate Route Frequency Ordered Stop   07/29/16 1930  clindamycin (CLEOCIN) IVPB 600 mg     600 mg 100 mL/hr over 30 Minutes Intravenous Every 8 hours 07/29/16 1653 07/30/16 2159   07/29/16 0557  ciprofloxacin (CIPRO) IVPB 400 mg     400 mg 200 mL/hr over 60 Minutes Intravenous 60 min pre-op 07/29/16 0557 07/29/16 0953   07/29/16 0557  clindamycin (CLEOCIN) IVPB 600 mg     600 mg 100 mL/hr over 30 Minutes Intravenous 30 min pre-op 07/29/16 0557 07/29/16 1142   07/29/16 0557  gentamicin (GARAMYCIN) 120 mg in dextrose 5 % 50 mL IVPB     120 mg 106 mL/hr over 30 Minutes Intravenous 30 min pre-op 07/29/16 0557 07/29/16 1111   07/29/16 0555  clindamycin (CLEOCIN) 600 MG/50ML IVPB    Comments:  Leilani Able: cabinet override      07/29/16 0555 07/29/16 1122   07/29/16 0555  ciprofloxacin (CIPRO) 400 MG/200ML IVPB    Comments:  Leilani Able: cabinet override       07/29/16 0555 07/29/16 1759     PROCEDURE: Foley and PCN removed in standard fashion, dressing placed over PCNL site  Assessment/Plan: s/p Procedure(s): NEPHROLITHOTOMY PERCUTANEOUS (Left) plan for discharge today vs tomorrow based on pain control  To follow up with primary urologist as scheduled  LOS: 1 day    Zonia Kief 07/30/2016

## 2016-07-30 NOTE — Progress Notes (Signed)
After RN administered  morphine 4 mg PRN, pt asked RN if she "could have the morphine syringes to take home to her mother." RN expressed to pt that any type of syringes can not be given to patients and must be placed in sharps containers immediately after using them.   Teresa Davenport [place]

## 2016-07-31 ENCOUNTER — Inpatient Hospital Stay: Payer: Self-pay

## 2016-07-31 LAB — COMPREHENSIVE METABOLIC PANEL
ALK PHOS: 113 U/L (ref 38–126)
ALT: 42 U/L (ref 14–54)
ANION GAP: 8 (ref 5–15)
AST: 48 U/L — ABNORMAL HIGH (ref 15–41)
Albumin: 3.7 g/dL (ref 3.5–5.0)
BILIRUBIN TOTAL: 0.3 mg/dL (ref 0.3–1.2)
BUN: 8 mg/dL (ref 6–20)
CALCIUM: 8.5 mg/dL — AB (ref 8.9–10.3)
CO2: 27 mmol/L (ref 22–32)
CREATININE: 0.7 mg/dL (ref 0.44–1.00)
Chloride: 101 mmol/L (ref 101–111)
GFR calc Af Amer: >60 mL/min (ref >60)
GFR calc non Af Amer: >60 mL/min (ref >60)
GLUCOSE: 88 mg/dL (ref 65–99)
Potassium: 3 mmol/L — ABNORMAL LOW (ref 3.5–5.1)
SODIUM: 136 mmol/L (ref 135–145)
TOTAL PROTEIN: 6.6 g/dL (ref 6.5–8.1)

## 2016-07-31 LAB — CBC
HCT: 32.2 % — ABNORMAL LOW (ref 35.0–47.0)
HEMOGLOBIN: 11.2 g/dL — AB (ref 12.0–16.0)
MCH: 31.8 pg (ref 26.0–34.0)
MCHC: 34.8 g/dL (ref 32.0–36.0)
MCV: 91.4 fL (ref 80.0–100.0)
Platelets: 217 10*3/uL (ref 150–440)
RBC: 3.52 MIL/uL — ABNORMAL LOW (ref 3.80–5.20)
RDW: 13.7 % (ref 11.5–14.5)
WBC: 9.6 10*3/uL (ref 3.6–11.0)

## 2016-07-31 LAB — CULTURE, URINE COMPREHENSIVE

## 2016-07-31 LAB — LACTIC ACID, PLASMA: Lactic Acid, Venous: 0.9 mmol/L (ref 0.5–1.9)

## 2016-07-31 MED ORDER — ACETAMINOPHEN 325 MG PO TABS
650.0000 mg | ORAL_TABLET | Freq: Four times a day (QID) | ORAL | Status: DC | PRN
  Administered 2016-07-31 – 2016-08-01 (×4): 650 mg via ORAL
  Filled 2016-07-31 (×5): qty 2

## 2016-07-31 MED ORDER — CIPROFLOXACIN IN D5W 400 MG/200ML IV SOLN
400.0000 mg | Freq: Two times a day (BID) | INTRAVENOUS | Status: DC
  Administered 2016-07-31 – 2016-08-01 (×3): 400 mg via INTRAVENOUS
  Filled 2016-07-31 (×4): qty 200

## 2016-07-31 MED ORDER — GENTAMICIN SULFATE 40 MG/ML IJ SOLN
5.0000 mg/kg | INTRAMUSCULAR | Status: DC
  Administered 2016-07-31 – 2016-08-02 (×3): 250 mg via INTRAVENOUS
  Filled 2016-07-31 (×3): qty 6.25

## 2016-07-31 MED ORDER — ACETAMINOPHEN 325 MG PO TABS
650.0000 mg | ORAL_TABLET | Freq: Once | ORAL | Status: AC
  Administered 2016-07-31: 650 mg via ORAL
  Filled 2016-07-31: qty 2

## 2016-07-31 MED ORDER — KCL IN DEXTROSE-NACL 20-5-0.45 MEQ/L-%-% IV SOLN
INTRAVENOUS | Status: DC
  Administered 2016-07-31 – 2016-08-01 (×3): via INTRAVENOUS
  Administered 2016-08-02: 1000 mL via INTRAVENOUS
  Administered 2016-08-02: 23:00:00 via INTRAVENOUS
  Filled 2016-07-31 (×8): qty 1000

## 2016-07-31 NOTE — Progress Notes (Signed)
Dr Annabell HowellsWrenn notified of temp 103.1, bp 103/44, pulse 114. Orders given.

## 2016-07-31 NOTE — Progress Notes (Signed)
ANTIBIOTIC CONSULT NOTE - INITIAL  Pharmacy Consult for gentamicin Indication: sepsis  Allergies  Allergen Reactions  . Meropenem Shortness Of Breath  . Penicillins Anaphylaxis and Swelling    Has patient had a PCN reaction causing immediate rash, facial/tongue/throat swelling, SOB or lightheadedness with hypotension: Yes Has patient had a PCN reaction causing severe rash involving mucus membranes or skin necrosis: No Has patient had a PCN reaction that required hospitalization No Has patient had a PCN reaction occurring within the last 10 years: No If all of the above answers are "NO", then may proceed with Cephalosporin use.  Marland Kitchen Doxycycline Swelling  . Motrin [Ibuprofen] Swelling  . Tape     Patient says she cannot tolerate any tape but tegaderm  . Toradol [Ketorolac Tromethamine] Swelling  . Tramadol Swelling  . Amoxicillin Rash  . Ketorolac Rash  . Methadone Rash  . Naproxen Sodium Rash    Patient Measurements: Height: 5\' 2"  (157.5 cm) Weight: 135 lb (61.2 kg) IBW/kg (Calculated) : 50.1 Adjusted Body Weight: 54.5 kg  Vital Signs: Temp: 103.1 F (39.5 C) (01/21 0739) Temp Source: Oral (01/21 0739) BP: 113/60 (01/21 0739) Pulse Rate: 112 (01/21 0739) Intake/Output from previous day: 01/20 0701 - 01/21 0700 In: 1301 [P.O.:560; I.V.:741] Out: 2300 [Urine:2300] Intake/Output from this shift: No intake/output data recorded.  Labs:  Recent Labs  07/29/16 0634 07/29/16 0642 07/30/16 0657 07/31/16 0853 07/31/16 0954  WBC 6.5  --  13.4* 9.6  --   HGB 13.9 13.6 13.2 11.2*  --   PLT 245  --  235 217  --   CREATININE 0.68  --  0.62  --  0.70   Estimated Creatinine Clearance: 72.4 mL/min (by C-G formula based on SCr of 0.7 mg/dL). No results for input(s): VANCOTROUGH, VANCOPEAK, VANCORANDOM, GENTTROUGH, GENTPEAK, GENTRANDOM, TOBRATROUGH, TOBRAPEAK, TOBRARND, AMIKACINPEAK, AMIKACINTROU, AMIKACIN in the last 72 hours.   Microbiology: Recent Results (from the past 720  hour(s))  Urine culture     Status: Abnormal   Collection Time: 07/19/16  9:50 AM  Result Value Ref Range Status   Specimen Description URINE, CLEAN CATCH  Final   Special Requests NONE  Final   Culture MULTIPLE SPECIES PRESENT, SUGGEST RECOLLECTION (A)  Final   Report Status 07/20/2016 FINAL  Final  CULTURE, URINE COMPREHENSIVE     Status: Abnormal   Collection Time: 07/25/16 10:40 AM  Result Value Ref Range Status   Urine Culture, Comprehensive Final report (A)  Final   Result 1 Enterococcus faecalis (A)  Final    Comment: Greater than 100,000 colony forming units per mL Note: this isolate is vancomycin-susceptible. This information is provided for epidemiologic purposes only: vancomycin is not among the antibiotics recommended for therapy of urinary tract infections caused by Enterococcus.    ANTIMICROBIAL SUSCEPTIBILITY Comment  Final    Comment:       ** S = Susceptible; I = Intermediate; R = Resistant **                    P = Positive; N = Negative             MICS are expressed in micrograms per mL    Antibiotic                 RSLT#1    RSLT#2    RSLT#3    RSLT#4 Ciprofloxacin                  S Levofloxacin  S Nitrofurantoin                 S Penicillin                     S Tetracycline                   R Vancomycin                     S   Microscopic Examination     Status: Abnormal   Collection Time: 07/25/16 10:40 AM  Result Value Ref Range Status   WBC, UA >30 (A) 0 - 5 /hpf Final   RBC, UA 3-10 (A) 0 - 2 /hpf Final   Epithelial Cells (non renal) 0-10 0 - 10 /hpf Final   Mucus, UA Present (A) Not Estab. Final   Bacteria, UA Few None seen/Few Final    Medical History: Past Medical History:  Diagnosis Date  . Breast mass 05/22/2012  . Chronic cystitis 10/15/2014  . Chronic pain 05/31/2012  . Complication of anesthesia    hard for me to go under   . Gross hematuria 09/17/2014  . History of kidney stones   . Hydronephrosis with urinary  obstruction due to ureteral calculus 04/29/2016  . Hypokalemia 03/23/2016  . Kidney stone   . Meningitis 2013  . Pneumonia 2014  . Pyelonephritis 04/17/2016  . Renal colic 09/17/2014  . Renal disorder   . Rib fracture   . Traumatic brain injury (HCC) 2014  . Ureterolithiasis 04/29/2016    Medications:  Scheduled:  . ciprofloxacin  400 mg Intravenous Q12H  . docusate sodium  100 mg Oral BID  . estrogens (conjugated)  0.45 mg Oral Daily  . gentamicin  5 mg/kg (Ideal) Intravenous Q24H  . heparin  5,000 Units Subcutaneous Q8H   Assessment: Patient admitted for sepsis s/p PCNL for renal stone. Pharmacy consulted for gentamicin dosing.  Goal of Therapy:  Gentamicin trough level <2 mcg/ml  Plan:  Will start initial gentamicin dose of 5 mg/kg extended interval (q24h). Will check gentamicin trough 1/22 0000 12 hours after dose to assess clearance. Will adjust dose based on level per hartford nomogram. Will continue to monitor renal function. Follow up culture results  Thomasene Rippleavid Ashanti Ratti, PharmD, BCPS Clinical Pharmacist 07/31/2016

## 2016-07-31 NOTE — Progress Notes (Signed)
Walking in hall frequently. Pain level remains at 10 even after Morphine. Urine blood tinged.

## 2016-07-31 NOTE — Progress Notes (Signed)
07/31/2016   9:00  Upon entering pt's room to perform assessment, discovered pt was not in the room and had disconnected her own IV tubing and left the unit without notifying me.  Explained to the patient that with her current condition, it is of paramount importance to remain in her room and remain connected to her IV fluids and medicines.  Pt expressed understanding, yet continued to leave the room throughout the day.  Pt seems to have been smoking downstairs.  I explained that this si a tobacco-free campus and that smoking is not allowed.  Pt denied that's what she was doing.  Pt continues to be minimally compliant with treatment plan throughout the day.  Bradly Chrisougherty, Jailan Trimm E, RN

## 2016-07-31 NOTE — Plan of Care (Signed)
Problem: Pain Managment: Goal: General experience of comfort will improve Outcome: Not Progressing Pt demands PRN pain meds at all times, insisting 10/10 pain any time she is asked.  Administering meds as ordered, however pt continues to state that this does not help.  Bradly Chrisougherty, Isabel Ardila E, RN

## 2016-07-31 NOTE — Progress Notes (Addendum)
2 Days Post-Op   Assessment and Plan:  Postop fever to 103 and tachycardia with severe left flank pain and possible sepsis following left PCNL with prior history of enterococcal UTI.  Need to r/o hematoma/urinoma.   1. Blood and urine cultures ordered. 2. CBC, CMP and lactate ordered stat.  Will consult hospital service is lactate is elevated.  3. CXR and CT.   Need to r/o urinoma/hematoma. 4. Will start Cipro and Gentamycin based on prior cultures and allergy profile.    CT and X-ray show no acute finding to explain pain or fever.   CMP and lactate are normal with the exception of mild hypokalemia and hypocalcemia with minimal elevation of the AST.    Continue antibiotics pending the culture at this time.    Subjective: Teresa Davenport is 2 days post op from a left PCNL for a left renal stone.   Her perc tube and foley were removed yesterday and a stent was left indwelling after a KUB showed only a left distal fragment.   She has had persistent pain that has worsened and radiates into her left lower chest and leg.   The pain has been sufficiently severe to cause nausea and vomiting and she has a fever to 103 with tachycardia this morning.  She had enterococcus on culture in the months prior to the procedure and had been given cleocin and gentamycin preop.   She has had a reduced UOP overnight per her report with only 2 voids.    ROS:  Review of Systems  Constitutional: Positive for fever.  Respiratory: Negative for cough and shortness of breath.   Cardiovascular: Negative for chest pain and leg swelling.  Gastrointestinal: Positive for abdominal pain, nausea and vomiting.  Genitourinary: Positive for hematuria.    Anti-infectives: Anti-infectives    Start     Dose/Rate Route Frequency Ordered Stop   07/31/16 0900  ciprofloxacin (CIPRO) IVPB 400 mg     400 mg 200 mL/hr over 60 Minutes Intravenous Every 12 hours 07/31/16 0813     07/29/16 1930  clindamycin (CLEOCIN) IVPB 600 mg     600  mg 100 mL/hr over 30 Minutes Intravenous Every 8 hours 07/29/16 1653 07/30/16 1538   07/29/16 0557  ciprofloxacin (CIPRO) IVPB 400 mg     400 mg 200 mL/hr over 60 Minutes Intravenous 60 min pre-op 07/29/16 0557 07/29/16 0953   07/29/16 0557  clindamycin (CLEOCIN) IVPB 600 mg     600 mg 100 mL/hr over 30 Minutes Intravenous 30 min pre-op 07/29/16 0557 07/29/16 1142   07/29/16 0557  gentamicin (GARAMYCIN) 120 mg in dextrose 5 % 50 mL IVPB     120 mg 106 mL/hr over 30 Minutes Intravenous 30 min pre-op 07/29/16 0557 07/29/16 1111   07/29/16 0555  clindamycin (CLEOCIN) 600 MG/50ML IVPB    Comments:  Leilani Able: cabinet override      07/29/16 0555 07/29/16 1122   07/29/16 0555  ciprofloxacin (CIPRO) 400 MG/200ML IVPB    Comments:  Leilani Able: cabinet override      07/29/16 0555 07/29/16 1759      Current Facility-Administered Medications  Medication Dose Route Frequency Provider Last Rate Last Dose  . 0.9 %  sodium chloride infusion   Intravenous Continuous Hildred Laser, MD 125 mL/hr at 07/31/16 [phone]    . acetaminophen (TYLENOL) tablet 650 mg  650 mg Oral Q6H PRN Bjorn Pippin, MD   650 mg at 07/31/16 0844  . ciprofloxacin (CIPRO) IVPB 400 mg  400 mg Intravenous Q12H Bjorn Pippin, MD      . docusate sodium (COLACE) capsule 100 mg  100 mg Oral BID Hildred Laser, MD   100 mg at 07/30/16 2122  . estrogens (conjugated) (PREMARIN) tablet 0.45 mg  0.45 mg Oral Daily Hildred Laser, MD   0.45 mg at 07/30/16 0915  . heparin injection 5,000 Units  5,000 Units Subcutaneous Q8H Hildred Laser, MD   5,000 Units at 07/31/16 0444  . morphine 2 MG/ML injection 2-4 mg  2-4 mg Intravenous Q2H PRN Hildred Laser, MD   4 mg at 07/31/16 0737  . ondansetron (ZOFRAN) injection 4 mg  4 mg Intravenous Q4H PRN Hildred Laser, MD      . opium-belladonna (B&O SUPPRETTES) 16.2-60 MG suppository 1 suppository  1 suppository Rectal Q6H PRN Hildred Laser, MD      . oxybutynin [facility])  tablet 5 mg  5 mg Oral Q8H PRN Hildred Laser, MD   5 mg at 07/29/16 1746  . oxyCODONE (Oxy IR/ROXICODONE) immediate release tablet 10 mg  10 mg Oral QID PRN Hildred Laser, MD   10 mg at 07/31/16 0845  . zolpidem (AMBIEN) tablet 5 mg  5 mg Oral QHS PRN Bjorn Pippin, MD   5 mg at 07/29/16 2323     Objective: Vital signs in last 24 hours: Temp:  [98.1 F (36.7 C)-103.1 F (39.5 C)] 103.1 F (39.5 C) (01/21 0739) Pulse Rate:  [89-112] 112 (01/21 0739) Resp:  [17-18] 17 (01/21 0443) BP: (107-119)/(60-71) 113/60 (01/21 0739) SpO2:  [97 %-100 %] 97 % (01/21 0739)  Intake/Output from previous day: 01/20 0701 - 01/21 0700 In: 1301 [P.O.:560; I.V.:741] Out: 2300 [Urine:2300] Intake/Output this shift: No intake/output data recorded.   Physical Exam  Constitutional: She is oriented to person, place, and time.  WD,WN, WF in acute distress with pain.   A/O x 3.   Cardiovascular:  Sinus tachycardia with normal rhythm.   Pulmonary/Chest: Effort normal and breath sounds normal. No respiratory distress.  Abdominal:  Soft, with diffuse tenderness worst in the LUQ and flank.   She has guarding.  No mass or flank ecchymosis is noted.  Her perc dressing is dry.     Musculoskeletal: Normal range of motion. She exhibits no edema or tenderness.  Neurological: She is alert and oriented to person, place, and time.  Skin: Skin is warm and dry.  Psychiatric:  Agitated     Lab Results:   Recent Labs  07/30/16 0657 07/31/16 0853  WBC 13.4* 9.6  HGB 13.2 11.2*  HCT 38.7 32.2*  PLT 235 217   BMET  Recent Labs  07/29/16 0634 07/29/16 0642 07/30/16 0657  NA 139 141 138  K 3.3* 3.4* 3.9  CL 101  --  104  CO2 29  --  25  GLUCOSE 90 92 86  BUN 11  --  12  CREATININE 0.68  --  0.62  CALCIUM 9.5  --  9.7   PT/INR  Recent Labs  07/29/16 0634  LABPROT 12.2  INR 0.91   ABG No results for input(s): PHART, HCO3 in the last 72 hours.  Invalid input(s): PCO2,  PO2  Studies/Results: Dg Abd 1 View  Result Date: 07/30/2016 CLINICAL DATA:  Nephrostomy tube placement EXAM: ABDOMEN - 1 VIEW COMPARISON:  None. FINDINGS: A nephrostomy tube projects over the left kidney. A left ureteral stent is identified. There is a calcification near the distal aspect of the ureteral  stent suspicious for a ureteral stone. No other abnormalities are identified. IMPRESSION: Left nephrostomy tube projected over the left kidney with a left ureteral stent. Probable 3 mm stone adjacent to the distal left ureteral stent. Electronically Signed   By: Gerome Samavid  Williams III M.D   On: 07/30/2016 09:03   Dg C-arm 61-120 Min  Result Date: 07/29/2016 CLINICAL DATA:  Left-sided percutaneous nephrolithotomy. EXAM: DG NEPHROSTOGRAM EXISTING ACCESS LEFT; DG C-ARM 61-120 MIN COMPARISON:  Imaging during left renal access and nephroureteral catheter placement earlier today. FINDINGS: Imaging obtained intraoperatively demonstrates opacification of the collecting system, placement of large caliber sheath and removal of lower pole calculi in the left renal collecting system. A ureteral stent was placed extending from the renal pelvis into the bladder and a large caliber nephrostomy tube is present on completion. IMPRESSION: Imaging obtained during left percutaneous nephrolithotomy procedure to remove lower pole calculi. Electronically Signed   By: Irish LackGlenn  Yamagata M.D.   On: 07/29/2016 14:43   Dg Nephrostogram Left Thru Existing Access  Result Date: 07/29/2016 CLINICAL DATA:  Left-sided percutaneous nephrolithotomy. EXAM: DG NEPHROSTOGRAM EXISTING ACCESS LEFT; DG C-ARM 61-120 MIN COMPARISON:  Imaging during left renal access and nephroureteral catheter placement earlier today. FINDINGS: Imaging obtained intraoperatively demonstrates opacification of the collecting system, placement of large caliber sheath and removal of lower pole calculi in the left renal collecting system. A ureteral stent was placed  extending from the renal pelvis into the bladder and a large caliber nephrostomy tube is present on completion. IMPRESSION: Imaging obtained during left percutaneous nephrolithotomy procedure to remove lower pole calculi. Electronically Signed   By: Irish LackGlenn  Yamagata M.D.   On: 07/29/2016 14:43    Recent films and labs reviewed.                 LOS: 2 days    Anner CreteWRENN,Leviathan Macera J 07/31/2016 [phone] ID: Teresa PeerPatricia L Davenport, female   DOB: 05/01/1966, 51 y.o.   MRN: [id number]

## 2016-08-01 ENCOUNTER — Encounter: Payer: Self-pay | Admitting: Urology

## 2016-08-01 LAB — BASIC METABOLIC PANEL
ANION GAP: 10 (ref 5–15)
BUN: 9 mg/dL (ref 6–20)
CALCIUM: 8.6 mg/dL — AB (ref 8.9–10.3)
CO2: 27 mmol/L (ref 22–32)
Chloride: 95 mmol/L — ABNORMAL LOW (ref 101–111)
Creatinine, Ser: 0.93 mg/dL (ref 0.44–1.00)
Glucose, Bld: 147 mg/dL — ABNORMAL HIGH (ref 65–99)
Potassium: 3 mmol/L — ABNORMAL LOW (ref 3.5–5.1)
Sodium: 132 mmol/L — ABNORMAL LOW (ref 135–145)

## 2016-08-01 LAB — GENTAMICIN LEVEL, TROUGH: GENTAMICIN TR: 1.5 ug/mL (ref 0.5–2.0)

## 2016-08-01 MED ORDER — CIPROFLOXACIN HCL 500 MG PO TABS
500.0000 mg | ORAL_TABLET | Freq: Two times a day (BID) | ORAL | Status: DC
  Administered 2016-08-01 – 2016-08-02 (×3): 500 mg via ORAL
  Filled 2016-08-01 (×3): qty 1

## 2016-08-01 MED ORDER — DIAZEPAM 5 MG PO TABS
10.0000 mg | ORAL_TABLET | Freq: Once | ORAL | Status: AC
  Administered 2016-08-01: 10 mg via ORAL
  Filled 2016-08-01: qty 2

## 2016-08-01 MED ORDER — DIAZEPAM 5 MG PO TABS
5.0000 mg | ORAL_TABLET | Freq: Once | ORAL | Status: AC
  Administered 2016-08-01: 5 mg via ORAL
  Filled 2016-08-01: qty 1

## 2016-08-01 NOTE — Progress Notes (Signed)
PHARMACIST - PHYSICIAN COMMUNICATION DR:   Sherryl BartersBudzyn CONCERNING: Antibiotic IV to Oral Route Change Policy  RECOMMENDATION: This patient is receiving ciprofloxacin by the intravenous route.  Based on criteria approved by the Pharmacy and Therapeutics Committee, the antibiotic(s) is/are being converted to the equivalent oral dose form(s).   DESCRIPTION: These criteria include:  Patient being treated for a respiratory tract infection, urinary tract infection, cellulitis or clostridium difficile associated diarrhea if on metronidazole  The patient is not neutropenic and does not exhibit a GI malabsorption state  The patient is eating (either orally or via tube) and/or has been taking other orally administered medications for a least 24 hours  The patient is improving clinically and has a Tmax < 100.5  If you have questions about this conversion, please contact the Pharmacy Department   Cindi CarbonMary M Nikoloz Huy. PharmD 08/01/16 1:55 PM

## 2016-08-01 NOTE — Progress Notes (Signed)
ANTIBIOTIC CONSULT NOTE - INITIAL  Pharmacy Consult for gentamicin Indication: sepsis  Allergies  Allergen Reactions  . Meropenem Shortness Of Breath  . Penicillins Anaphylaxis and Swelling    Has patient had a PCN reaction causing immediate rash, facial/tongue/throat swelling, SOB or lightheadedness with hypotension: Yes Has patient had a PCN reaction causing severe rash involving mucus membranes or skin necrosis: No Has patient had a PCN reaction that required hospitalization No Has patient had a PCN reaction occurring within the last 10 years: No If all of the above answers are "NO", then may proceed with Cephalosporin use.  Marland Kitchen. Doxycycline Swelling  . Motrin [Ibuprofen] Swelling  . Tape     Patient says she cannot tolerate any tape but tegaderm  . Toradol [Ketorolac Tromethamine] Swelling  . Tramadol Swelling  . Amoxicillin Rash  . Ketorolac Rash  . Methadone Rash  . Naproxen Sodium Rash    Patient Measurements: Height: 5\' 2"  (157.5 cm) Weight: 135 lb (61.2 kg) IBW/kg (Calculated) : 50.1 Adjusted Body Weight: 54.5 kg  Vital Signs: Temp: 98.7 F (37.1 C) (01/21 2208) Temp Source: Oral (01/21 2208) BP: 103/44 (01/21 1959) Pulse Rate: 114 (01/21 1959) Intake/Output from previous day: 01/21 0701 - 01/22 0700 In: 1084.3 [I.V.:778; IV Piggyback:306.3] Out: 1150 [Urine:1150] Intake/Output from this shift: Total I/O In: -  Out: 650 [Urine:650]  Labs:  Recent Labs  07/29/16 0634 07/29/16 0642 07/30/16 0657 07/31/16 0853 07/31/16 0954 08/01/16 0008  WBC 6.5  --  13.4* 9.6  --   --   HGB 13.9 13.6 13.2 11.2*  --   --   PLT 245  --  235 217  --   --   CREATININE 0.68  --  0.62  --  0.70 0.93   Estimated Creatinine Clearance: 62.3 mL/min (by C-G formula based on SCr of 0.93 mg/dL).  Recent Labs  08/01/16 0008  GENTTROUGH 1.5     Microbiology: Recent Results (from the past 720 hour(s))  Urine culture     Status: Abnormal   Collection Time: 07/19/16  9:50  AM  Result Value Ref Range Status   Specimen Description URINE, CLEAN CATCH  Final   Special Requests NONE  Final   Culture MULTIPLE SPECIES PRESENT, SUGGEST RECOLLECTION (A)  Final   Report Status 07/20/2016 FINAL  Final  CULTURE, URINE COMPREHENSIVE     Status: Abnormal   Collection Time: 07/25/16 10:40 AM  Result Value Ref Range Status   Urine Culture, Comprehensive Final report (A)  Final   Result 1 Enterococcus faecalis (A)  Final    Comment: Greater than 100,000 colony forming units per mL Note: this isolate is vancomycin-susceptible. This information is provided for epidemiologic purposes only: vancomycin is not among the antibiotics recommended for therapy of urinary tract infections caused by Enterococcus.    ANTIMICROBIAL SUSCEPTIBILITY Comment  Final    Comment:       ** S = Susceptible; I = Intermediate; R = Resistant **                    P = Positive; N = Negative             MICS are expressed in micrograms per mL    Antibiotic                 RSLT#1    RSLT#2    RSLT#3    RSLT#4 Ciprofloxacin  S Levofloxacin                   S Nitrofurantoin                 S Penicillin                     S Tetracycline                   R Vancomycin                     S   Microscopic Examination     Status: Abnormal   Collection Time: 07/25/16 10:40 AM  Result Value Ref Range Status   WBC, UA >30 (A) 0 - 5 /hpf Final   RBC, UA 3-10 (A) 0 - 2 /hpf Final   Epithelial Cells (non renal) 0-10 0 - 10 /hpf Final   Mucus, UA Present (A) Not Estab. Final   Bacteria, UA Few None seen/Few Final    Medical History: Past Medical History:  Diagnosis Date  . Breast mass 05/22/2012  . Chronic cystitis 10/15/2014  . Chronic pain 05/31/2012  . Complication of anesthesia    hard for me to go under   . Gross hematuria 09/17/2014  . History of kidney stones   . Hydronephrosis with urinary obstruction due to ureteral calculus 04/29/2016  . Hypokalemia 03/23/2016  . Kidney  stone   . Meningitis 2013  . Pneumonia 2014  . Pyelonephritis 04/17/2016  . Renal colic 09/17/2014  . Renal disorder   . Rib fracture   . Traumatic brain injury (HCC) 2014  . Ureterolithiasis 04/29/2016    Medications:  Scheduled:  . ciprofloxacin  400 mg Intravenous Q12H  . docusate sodium  100 mg Oral BID  . estrogens (conjugated)  0.45 mg Oral Daily  . gentamicin  5 mg/kg (Ideal) Intravenous Q24H  . heparin  5,000 Units Subcutaneous Q8H   Assessment: Patient admitted for sepsis s/p PCNL for renal stone. Pharmacy consulted for gentamicin dosing.  Goal of Therapy:  Gentamicin trough level <2 mcg/ml  Plan:  Will start initial gentamicin dose of 5 mg/kg extended interval (q24h). Will check gentamicin trough 1/22 0000 12 hours after dose to assess clearance. Will adjust dose based on level per hartford nomogram. Will continue to monitor renal function. Follow up culture results   1/22 0008 gentamicin random approximately 8.5 hr after start of infusion 1.5 mcg/mL. Continue current dose and frequency. Pharmacy will continue to follow.  Levi Crass A. Dahlia Bailiff, PharmD, BCPS Clinical Pharmacist 08/01/2016

## 2016-08-01 NOTE — Progress Notes (Signed)
Urology Consult Follow Up  Subjective: Continues to spike fever but otherwise doing well.  Anxious to go outside.  No n/v.  CT scan (noncontrast) unremarkable, stent in good position.  Very little flank drainage.  Anti-infectives: Anti-infectives    Start     Dose/Rate Route Frequency Ordered Stop   08/01/16 2000  ciprofloxacin (CIPRO) tablet 500 mg     500 mg Oral 2 times daily 08/01/16 1354     07/31/16 1200  gentamicin (GARAMYCIN) 250 mg in dextrose 5 % 100 mL IVPB     5 mg/kg  50.1 kg (Ideal) 106.3 mL/hr over 60 Minutes Intravenous Every 24 hours 07/31/16 1148     07/31/16 0900  ciprofloxacin (CIPRO) IVPB 400 mg  Status:  Discontinued     400 mg 200 mL/hr over 60 Minutes Intravenous Every 12 hours 07/31/16 0813 08/01/16 1353   07/29/16 1930  clindamycin (CLEOCIN) IVPB 600 mg     600 mg 100 mL/hr over 30 Minutes Intravenous Every 8 hours 07/29/16 1653 07/30/16 1538   07/29/16 0557  ciprofloxacin (CIPRO) IVPB 400 mg     400 mg 200 mL/hr over 60 Minutes Intravenous 60 min pre-op 07/29/16 0557 07/29/16 0953   07/29/16 0557  clindamycin (CLEOCIN) IVPB 600 mg     600 mg 100 mL/hr over 30 Minutes Intravenous 30 min pre-op 07/29/16 0557 07/29/16 1142   07/29/16 0557  gentamicin (GARAMYCIN) 120 mg in dextrose 5 % 50 mL IVPB     120 mg 106 mL/hr over 30 Minutes Intravenous 30 min pre-op 07/29/16 0557 07/29/16 1111   07/29/16 0555  clindamycin (CLEOCIN) 600 MG/50ML IVPB    Comments:  [name], Pat: cabinet override      07/29/16 0555 07/29/16 1122   07/29/16 0555  ciprofloxacin (CIPRO) 400 MG/200ML IVPB    Comments:  [name], Pat: cabinet override      07/29/16 0555 07/29/16 1759      Current Facility-Administered Medications  Medication Dose Route Frequency Provider Last Rate Last Dose  . acetaminophen (TYLENOL) tablet 650 mg  650 mg Oral Q6H PRN Bjorn PippinJohn Wrenn, MD   650 mg at 08/01/16 1703  . ciprofloxacin (CIPRO) tablet 500 mg  500 mg Oral BID Cindi CarbonMary M Swayne, [facility] CenterRPH      . dextrose 5 %  and 0.45 % NaCl with KCl 20 mEq/L infusion   Intravenous Continuous Bjorn PippinJohn Wrenn, MD 100 mL/hr at 08/01/16 1156    . docusate sodium (COLACE) capsule 100 mg  100 mg Oral BID Hildred LaserBrian James Budzyn, MD   100 mg at 08/01/16 1016  . estrogens (conjugated) (PREMARIN) tablet 0.45 mg  0.45 mg Oral Daily Hildred LaserBrian James Budzyn, MD   0.45 mg at 08/01/16 1016  . gentamicin (GARAMYCIN) 250 mg in dextrose 5 % 100 mL IVPB  5 mg/kg (Ideal) Intravenous Q24H Bjorn PippinJohn Wrenn, MD   250 mg at 08/01/16 1308  . heparin injection 5,000 Units  5,000 Units Subcutaneous Q8H Hildred LaserBrian James Budzyn, MD   5,000 Units at 08/01/16 1314  . morphine 2 MG/ML injection 2-4 mg  2-4 mg Intravenous Q2H PRN Hildred LaserBrian James Budzyn, MD   4 mg at 08/01/16 1703  . ondansetron (ZOFRAN) injection 4 mg  4 mg Intravenous Q4H PRN Hildred LaserBrian James Budzyn, MD   4 mg at 08/01/16 1703  . opium-belladonna (B&O SUPPRETTES) 16.2-60 MG suppository 1 suppository  1 suppository Rectal Q6H PRN Hildred LaserBrian James Budzyn, MD      . oxybutynin [facility](DITROPAN) tablet 5 mg  5 mg Oral Q8H  PRN Hildred Laser, MD   5 mg at 07/31/16 1706  . oxyCODONE (Oxy IR/ROXICODONE) immediate release tablet 10 mg  10 mg Oral QID PRN Hildred Laser, MD   10 mg at 08/01/16 1616  . zolpidem (AMBIEN) tablet 5 mg  5 mg Oral QHS PRN Bjorn Pippin, MD   5 mg at 07/29/16 2323     Objective: Vital signs in last 24 hours: Temp:  [97.7 F (36.5 C)-103.1 F (39.5 C)] 99.4 F (37.4 C) (01/22 1318) Pulse Rate:  [99-114] 100 (01/22 1318) Resp:  [16-24] 20 (01/22 1318) BP: (77-119)/(41-89) 93/41 (01/22 1318) SpO2:  [94 %-97 %] 97 % (01/22 0812)  Intake/Output from previous day: 01/21 0701 - 01/22 0700 In: 2347.3 [P.O.:240; I.V.:1801; IV Piggyback:306.3] Out: 1450 [Urine:1450] Intake/Output this shift: Total I/O In: 722 [P.O.:240; I.V.:482] Out: 0    Physical Exam  Constitutional: She is oriented to person, place, and time and well-developed, well-nourished, and in no distress.  HENT:  Head: Normocephalic  and atraumatic.  Neck: Neck supple.  Cardiovascular: Normal rate.   Pulmonary/Chest: Effort normal. No respiratory distress.  Abdominal: Soft. Bowel sounds are normal. She exhibits no distension.  Genitourinary:  Genitourinary Comments: Mild left CVA tenderness with no flank erythema, drainage.  Musculoskeletal: Normal range of motion.  Neurological: She is alert and oriented to person, place, and time.  Skin: Skin is warm and dry.  Psychiatric: Affect and judgment normal.    Lab Results:   Recent Labs  07/30/16 0657 07/31/16 0853  WBC 13.4* 9.6  HGB 13.2 11.2*  HCT 38.7 32.2*  PLT 235 217   BMET  Recent Labs  07/31/16 0954 08/01/16 0008  NA 136 132*  K 3.0* 3.0*  CL 101 95*  CO2 27 27  GLUCOSE 88 147*  BUN 8 9  CREATININE 0.70 0.93  CALCIUM 8.5* 8.6*   PT/INR No results for input(s): LABPROT, INR in the last 72 hours. ABG No results for input(s): PHART, HCO3 in the last 72 hours.  Invalid input(s): PCO2, PO2  Studies/Results: Ct Abdomen Pelvis Wo Contrast  Result Date: 07/31/2016 CLINICAL DATA:  Status post lithotripsy for left renal calculus. Pain. Nausea and vomiting. EXAM: CT ABDOMEN AND PELVIS WITHOUT CONTRAST TECHNIQUE: Multidetector CT imaging of the abdomen and pelvis was performed following the standard protocol without IV contrast. COMPARISON:  04/15/2016 FINDINGS: Lower chest: No acute abnormality. Hepatobiliary: No focal liver abnormality is seen. No gallstones, gallbladder wall thickening, or biliary dilatation. Pancreas: Unremarkable. No pancreatic ductal dilatation or surrounding inflammatory changes. Spleen: Normal in size without focal abnormality. Adrenals/Urinary Tract: Normal appearance of the adrenal glands. There is a punctate stone within the upper pole collecting system of right kidney. No right-sided hydronephrosis or mass. Four small stones remain within the right renal collecting system. The largest is in the upper pole measuring 6 mm, image  24 of series 2. A left sided ureteral stent is in place. There is gas within the left renal collecting system compatible with recent instrumentation. Asymmetric left-sided perinephric fat stranding is noted. No hydronephrosis. No evidence for left kidney hematoma. No perinephric fluid collections identified. Urinary bladder appears normal. Stomach/Bowel: The stomach is normal. The small bowel loops have a normal course and caliber. No obstruction. Unremarkable appearance of the colon. Vascular/Lymphatic: Aortic atherosclerosis noted. No aneurysm. There is no abdominal or pelvic adenopathy. Reproductive: Status post hysterectomy. No adnexal masses. Other: No abdominal wall hernia or abnormality. No abdominopelvic ascites. Musculoskeletal: No acute or significant osseous findings. IMPRESSION:  1. Left-sided nephroureteral stent is in place without evidence for hydronephrosis. Several small stones are identified within the left renal collecting system. 2. Left-sided perinephric fat stranding and gas within the left renal collecting system is identified and nonspecific in the early postoperative time frame. 3. No evidence for left kidney hematoma or perinephric fluid collections. Electronically Signed   By: Signa Kell M.D.   On: 07/31/2016 11:05   Dg Chest 1 View  Result Date: 07/31/2016 CLINICAL DATA:  Fever postop nephrostomy tube placement. EXAM: CHEST 1 VIEW COMPARISON:  08/10/2015, 07/05/2015 FINDINGS: The lungs are hyperinflated likely secondary to COPD. There is no focal parenchymal opacity. There is mild interstitial thickening along the right lateral mid lung. There is no pleural effusion or pneumothorax. The heart and mediastinal contours are unremarkable. There is evidence prior ORIF of left rib fractures. IMPRESSION: No active disease. Electronically Signed   By: Elige Ko   On: 07/31/2016 10:17   CT personally reviewed  Assessment: s/p Procedure(s): NEPHROLITHOTOMY PERCUTANEOUS, POD3    Complicated by pyelonpehritis/ possible bactermia without urinoma or hematoma.    Plan: -continue IV cipro (based on previous UCx data) -broaden tomorrow if fever curve no improve -repeat blood culture with high fevers -supportive care -am labs     LOS: 3 days    Teresa Davenport 08/01/2016

## 2016-08-01 NOTE — Progress Notes (Signed)
Per Dr. Apolinar JunesBrandon, patient is okay to go for a walk inside the hospital.

## 2016-08-02 LAB — CBC
HCT: 35.5 % (ref 35.0–47.0)
Hemoglobin: 12.3 g/dL (ref 12.0–16.0)
MCH: 31.6 pg (ref 26.0–34.0)
MCHC: 34.5 g/dL (ref 32.0–36.0)
MCV: 91.4 fL (ref 80.0–100.0)
PLATELETS: 226 10*3/uL (ref 150–440)
RBC: 3.88 MIL/uL (ref 3.80–5.20)
RDW: 13.6 % (ref 11.5–14.5)
WBC: 15.7 10*3/uL — AB (ref 3.6–11.0)

## 2016-08-02 LAB — URINE CULTURE: CULTURE: NO GROWTH

## 2016-08-02 LAB — BASIC METABOLIC PANEL
Anion gap: 10 (ref 5–15)
BUN: 9 mg/dL (ref 6–20)
CALCIUM: 9 mg/dL (ref 8.9–10.3)
CO2: 25 mmol/L (ref 22–32)
Chloride: 102 mmol/L (ref 101–111)
Creatinine, Ser: 0.84 mg/dL (ref 0.44–1.00)
GFR calc Af Amer: >60 mL/min (ref >60)
GFR calc non Af Amer: >60 mL/min (ref >60)
GLUCOSE: 106 mg/dL — AB (ref 65–99)
Potassium: 3.9 mmol/L (ref 3.5–5.1)
SODIUM: 137 mmol/L (ref 135–145)

## 2016-08-02 MED ORDER — MENTHOL 3 MG MT LOZG
1.0000 | LOZENGE | OROMUCOSAL | Status: DC | PRN
  Administered 2016-08-02 – 2016-08-03 (×3): 3 mg via ORAL
  Filled 2016-08-02: qty 9

## 2016-08-02 NOTE — Progress Notes (Signed)
08/02/16  Subjective: Fever curve improving.  Still tender on left flank without drainage.  No n/v.     Anti-infectives: Anti-infectives    Start     Dose/Rate Route Frequency Ordered Stop   08/01/16 2000  ciprofloxacin (CIPRO) tablet 500 mg     500 mg Oral 2 times daily 08/01/16 1354     07/31/16 1200  gentamicin (GARAMYCIN) 250 mg in dextrose 5 % 100 mL IVPB     5 mg/kg  50.1 kg (Ideal) 106.3 mL/hr over 60 Minutes Intravenous Every 24 hours 07/31/16 1148     07/31/16 0900  ciprofloxacin (CIPRO) IVPB 400 mg  Status:  Discontinued     400 mg 200 mL/hr over 60 Minutes Intravenous Every 12 hours 07/31/16 0813 08/01/16 1353   07/29/16 1930  clindamycin (CLEOCIN) IVPB 600 mg     600 mg 100 mL/hr over 30 Minutes Intravenous Every 8 hours 07/29/16 1653 07/30/16 1538   07/29/16 0557  ciprofloxacin (CIPRO) IVPB 400 mg     400 mg 200 mL/hr over 60 Minutes Intravenous 60 min pre-op 07/29/16 0557 07/29/16 0953   07/29/16 0557  clindamycin (CLEOCIN) IVPB 600 mg     600 mg 100 mL/hr over 30 Minutes Intravenous 30 min pre-op 07/29/16 0557 07/29/16 1142   07/29/16 0557  gentamicin (GARAMYCIN) 120 mg in dextrose 5 % 50 mL IVPB     120 mg 106 mL/hr over 30 Minutes Intravenous 30 min pre-op 07/29/16 0557 07/29/16 1111   07/29/16 0555  clindamycin (CLEOCIN) 600 MG/50ML IVPB    Comments:  Leilani Able: cabinet override      07/29/16 0555 07/29/16 1122   07/29/16 0555  ciprofloxacin (CIPRO) 400 MG/200ML IVPB    Comments:  Leilani Able: cabinet override      07/29/16 0555 07/29/16 1759      Current Facility-Administered Medications  Medication Dose Route Frequency Provider Last Rate Last Dose  . acetaminophen (TYLENOL) tablet 650 mg  650 mg Oral Q6H PRN Bjorn Pippin, MD   650 mg at 08/01/16 1703  . ciprofloxacin (CIPRO) tablet 500 mg  500 mg Oral BID Cindi Carbon, RPH   500 mg at 08/02/16 0857  . dextrose 5 % and 0.45 % NaCl with KCl 20 mEq/L infusion   Intravenous Continuous Bjorn Pippin, MD 100  mL/hr at 08/02/16 0851 1,000 mL at 08/02/16 0851  . docusate sodium (COLACE) capsule 100 mg  100 mg Oral BID Hildred Laser, MD   100 mg at 08/02/16 1036  . estrogens (conjugated) (PREMARIN) tablet 0.45 mg  0.45 mg Oral Daily Hildred Laser, MD   0.45 mg at 08/02/16 1036  . gentamicin (GARAMYCIN) 250 mg in dextrose 5 % 100 mL IVPB  5 mg/kg (Ideal) Intravenous Q24H Bjorn Pippin, MD   250 mg at 08/02/16 1118  . heparin injection 5,000 Units  5,000 Units Subcutaneous Q8H Hildred Laser, MD   5,000 Units at 08/02/16 1345  . menthol-cetylpyridinium (CEPACOL) lozenge 3 mg  1 lozenge Oral PRN Vanna Scotland, MD   3 mg at 08/02/16 0640  . morphine 2 MG/ML injection 2-4 mg  2-4 mg Intravenous Q2H PRN Hildred Laser, MD   4 mg at 08/02/16 1346  . ondansetron (ZOFRAN) injection 4 mg  4 mg Intravenous Q4H PRN Hildred Laser, MD   4 mg at 08/02/16 1036  . opium-belladonna (B&O SUPPRETTES) 16.2-60 MG suppository 1 suppository  1 suppository Rectal Q6H PRN Hildred Laser, MD      .  oxybutynin (DITROPAN) tablet 5 mg  5 mg Oral Q8H PRN Hildred LaserBrian James Budzyn, MD   5 mg at 07/31/16 1706  . oxyCODONE (Oxy IR/ROXICODONE) immediate release tablet 10 mg  10 mg Oral QID PRN Hildred LaserBrian James Budzyn, MD   10 mg at 08/02/16 0857  . zolpidem (AMBIEN) tablet 5 mg  5 mg Oral QHS PRN Bjorn PippinJohn Wrenn, MD   5 mg at 07/29/16 2323     Objective: Vital signs in last 24 hours: Temp:  [100.1 F (37.8 C)] 100.1 F (37.8 C) (01/23 0756) Pulse Rate:  [111] 111 (01/23 0757) BP: (88-98)/(46-68) 98/46 (01/23 0757) SpO2:  [97 %] 97 % (01/23 0756)  Intake/Output from previous day: 01/22 0701 - 01/23 0700 In: 722 [P.O.:240; I.V.:482] Out: 140 [Urine:140] Intake/Output this shift: Total I/O In: 240 [P.O.:240] Out: -    Physical Exam  Constitutional: She is oriented to person, place, and time and well-developed, well-nourished, and in no distress.  HENT:  Head: Normocephalic and atraumatic.  Neck: Neck supple.   Cardiovascular: Normal rate.   Pulmonary/Chest: Effort normal. No respiratory distress.  Abdominal: Soft. Bowel sounds are normal. She exhibits no distension.  Genitourinary:  Genitourinary Comments: Mild left CVA tenderness with no flank erythema, drainage.  Musculoskeletal: Normal range of motion.  Neurological: She is alert and oriented to person, place, and time.  Skin: Skin is warm and dry.  Psychiatric: Affect and judgment normal.    Lab Results:   Recent Labs  07/31/16 0853 08/02/16 0536  WBC 9.6 15.7*  HGB 11.2* 12.3  HCT 32.2* 35.5  PLT 217 226   BMET  Recent Labs  08/01/16 0008 08/02/16 0536  NA 132* 137  K 3.0* 3.9  CL 95* 102  CO2 27 25  GLUCOSE 147* 106*  BUN 9 9  CREATININE 0.93 0.84  CALCIUM 8.6* 9.0   PT/INR No results for input(s): LABPROT, INR in the last 72 hours. ABG No results for input(s): PHART, HCO3 in the last 72 hours.  Invalid input(s): PCO2, PO2  Studies/Results: No results found. CT personally reviewed  Assessment: s/p Procedure(s): NEPHROLITHOTOMY PERCUTANEOUS, POD4   Complicated by pyelonpehritis/ possible bactermia without urinoma or hematoma.    UCx negative.    Plan: -continue cipro, d/c gent given improving fever curve -repeat blood culture with high fevers -supportive care -am labs -anticipate discharge in AM if remains afebrile     LOS: 4 days    Vanna Scotlandshley Jamarrion Budai 08/02/2016

## 2016-08-02 NOTE — Progress Notes (Signed)
ANTIBIOTIC CONSULT NOTE - Follow up  Pharmacy Consult for gentamicin Indication: sepsis  Allergies  Allergen Reactions  . Meropenem Shortness Of Breath  . Penicillins Anaphylaxis and Swelling    Has patient had a PCN reaction causing immediate rash, facial/tongue/throat swelling, SOB or lightheadedness with hypotension: Yes Has patient had a PCN reaction causing severe rash involving mucus membranes or skin necrosis: No Has patient had a PCN reaction that required hospitalization No Has patient had a PCN reaction occurring within the last 10 years: No If all of the above answers are "NO", then may proceed with Cephalosporin use.  Marland Kitchen Doxycycline Swelling  . Motrin [Ibuprofen] Swelling  . Tape     Patient says she cannot tolerate any tape but tegaderm  . Toradol [Ketorolac Tromethamine] Swelling  . Tramadol Swelling  . Amoxicillin Rash  . Ketorolac Rash  . Methadone Rash  . Naproxen Sodium Rash    Patient Measurements: Height: 5\' 2"  (157.5 cm) Weight: 135 lb (61.2 kg) IBW/kg (Calculated) : 50.1 Adjusted Body Weight: 54.5 kg  Vital Signs: Temp: 100.1 F (37.8 C) (01/23 0756) Temp Source: Oral (01/23 0756) BP: 98/46 (01/23 0757) Pulse Rate: 111 (01/23 0757) Intake/Output from previous day: 01/22 0701 - 01/23 0700 In: 722 [P.O.:240; I.V.:482] Out: 140 [Urine:140] Intake/Output from this shift: No intake/output data recorded.  Labs:  Recent Labs  07/31/16 0853 07/31/16 0954 08/01/16 0008 08/02/16 0536  WBC 9.6  --   --  15.7*  HGB 11.2*  --   --  12.3  PLT 217  --   --  226  CREATININE  --  0.70 0.93 0.84   Estimated Creatinine Clearance: 68.9 mL/min (by C-G formula based on SCr of 0.84 mg/dL).  Recent Labs  08/01/16 0008  GENTTROUGH 1.5     Microbiology: Recent Results (from the past 720 hour(s))  Urine culture     Status: Abnormal   Collection Time: 07/19/16  9:50 AM  Result Value Ref Range Status   Specimen Description URINE, CLEAN CATCH  Final   Special Requests NONE  Final   Culture MULTIPLE SPECIES PRESENT, SUGGEST RECOLLECTION (A)  Final   Report Status 07/20/2016 FINAL  Final  CULTURE, URINE COMPREHENSIVE     Status: Abnormal   Collection Time: 07/25/16 10:40 AM  Result Value Ref Range Status   Urine Culture, Comprehensive Final report (A)  Final   Result 1 Enterococcus faecalis (A)  Final    Comment: Greater than 100,000 colony forming units per mL Note: this isolate is vancomycin-susceptible. This information is provided for epidemiologic purposes only: vancomycin is not among the antibiotics recommended for therapy of urinary tract infections caused by Enterococcus.    ANTIMICROBIAL SUSCEPTIBILITY Comment  Final    Comment:       ** S = Susceptible; I = Intermediate; R = Resistant **                    P = Positive; N = Negative             MICS are expressed in micrograms per mL    Antibiotic                 RSLT#1    RSLT#2    RSLT#3    RSLT#4 Ciprofloxacin                  S Levofloxacin  S Nitrofurantoin                 S Penicillin                     S Tetracycline                   R Vancomycin                     S   Microscopic Examination     Status: Abnormal   Collection Time: 07/25/16 10:40 AM  Result Value Ref Range Status   WBC, UA >30 (A) 0 - 5 /hpf Final   RBC, UA 3-10 (A) 0 - 2 /hpf Final   Epithelial Cells (non renal) 0-10 0 - 10 /hpf Final   Mucus, UA Present (A) Not Estab. Final   Bacteria, UA Few None seen/Few Final   Assessment: Patient admitted for sepsis s/p PCNL for renal stone. Pharmacy consulted for gentamicin dosing.  Goal of Therapy:  Gentamicin trough level <2 mcg/ml  Plan:  Will start initial gentamicin dose of 5 mg/kg extended interval (q24h). Will check gentamicin trough 1/22 0000 12 hours after dose to assess clearance. Will adjust dose based on level per hartford nomogram. Will continue to monitor renal function. Follow up culture results   1/22 0008  gentamicin random approximately 8.5 hr after start of infusion 1.5 mcg/mL. Continue current dose and frequency. Pharmacy will continue to follow.  1/23 SCr 0.84 stable this AM, CrCl 68.9 ml/min per chart - will order another SCr for tomorrow AM to continue to monitor renal function. Continue current dosing.   Crist FatHannah Reneta Niehaus, PharmD, BCPS Clinical Pharmacist 08/02/2016 8:41 AM

## 2016-08-02 NOTE — Progress Notes (Deleted)
Called Dr. Emmit PomfretHugelmeyer regarding patient's low blood pressure.  Unable to get iv access.  Doctor will call critical care to get access and proceed from there.  Teresa Davenport, Teresa Davenport  08/02/2016  1:22 AM

## 2016-08-02 NOTE — Progress Notes (Signed)
Called Dr. Hugelmeyer reEmmit Pomfretgarding patient request for valium to help with sleeping.  Appropriate orders were placed.  Arturo MortonClay, Jonee Lamore N 08/02/2016  4:36 AM

## 2016-08-03 ENCOUNTER — Telehealth: Payer: Self-pay | Admitting: Radiology

## 2016-08-03 ENCOUNTER — Other Ambulatory Visit: Payer: Self-pay | Admitting: Radiology

## 2016-08-03 DIAGNOSIS — N159 Renal tubulo-interstitial disease, unspecified: Secondary | ICD-10-CM

## 2016-08-03 MED ORDER — CIPROFLOXACIN HCL 500 MG PO TABS: 500.0000 mg | ORAL_TABLET | Freq: Two times a day (BID) | ORAL | 0 refills | Status: DC

## 2016-08-03 NOTE — Progress Notes (Signed)
Pt. had orders for 4 mg Morphine Q 2hrs and requested it as ordered. Pt. had IV wrapped tightly with Coban wrap and would have IV fluids unhooked so she could go off the unit.. At start of shift when admin. IV Morphine  I asked her to remove some of the wrap to make the IV easily accessible which she did. After doing this she noted that her arm was swollen under the wrap but insisted that she still get the med. There was good blood return so I admin the med but there was also swelling to the right of the IV. I informed her that IV's go bad with excessive manipulation. Pt called me to her room early in the AM complaining of pain at the IV site and to bring her pain med. When I got to the room she had already unhooked the fluids from her IV noting that it was painful and she wanted it out.  As I proceeded to give her the pain, med 2 oxycodone pills she asked for the IV Morphine. I informed her that the IV was no good if it is hurting and swelling and we would have to get a new one. She took the pills and stated that she was going downstairs for a walk. I informed her that she was not allow after having pain med.  The Nurse Sup. and another nurse came to the floor to try and get access as she is a hard stick, they were unsuccessful. The Pt came out after them and asked me for pain med. I reminded her that she had already gotten the PO med and Tylenol was the only other med available. She stated in front of the Nurse Supervisor that she was leaving. She was asked if she wanted to sign AMA papers and she said she did. She signed the papers and I informed her that I had to call the MD but she noted that she had asked to speak to the MD yesterday but she never came so she could call her PCP and wrote a note on the AMA papers to that effect. Pt then left for the ER to meet her sister. They both came back to the unit packed her stuff and she left accompanied by her sister.

## 2016-08-03 NOTE — Telephone Encounter (Signed)
Person who answered phone states pt is asleep but she will relay message. Per Dr Apolinar JunesBrandon, advised that pt has a kidney infection & needs antibiotics. Pt also has a stent that needs to be removed. Advised that pt has to have antibiotics prior to removing stent. Asked for return call. Person voices understanding.  Notified pt's friend that prescription for Cipro was sent to pt's pharmacy & reiterated that pt must take antibiotic & keep appt for stent removal. Friend voices understanding.

## 2016-08-04 LAB — STONE ANALYSIS
Ca Oxalate,Dihydrate: 15 %
Ca Oxalate,Monohydr.: 60 %
Ca phos cry stone ql IR: 25 %
Stone Weight KSTONE: 107.7 mg

## 2016-08-11 ENCOUNTER — Encounter: Payer: Self-pay | Admitting: Urology

## 2016-08-11 ENCOUNTER — Other Ambulatory Visit: Payer: Self-pay | Admitting: Urology

## 2016-08-11 NOTE — Discharge Summary (Signed)
Date of admission: 07/29/2016  Date of discharge: 08/11/2016  Admission diagnosis: Left nephrolithiasis  Discharge diagnosis: Left nephrolithiasis  Secondary diagnoses:  Patient Active Problem List   Diagnosis Date Noted  . Nephrolithiasis 07/29/2016  . Hydronephrosis with urinary obstruction due to ureteral calculus 04/29/2016  . Ureterolithiasis 04/29/2016  . Pyelonephritis 04/17/2016  . Kidney stone 04/16/2016  . Hypokalemia 03/23/2016  . Bladder pain 10/15/2014  . Chronic cystitis 10/15/2014  . Gross hematuria 09/17/2014  . Renal colic 09/17/2014  . Headache 10/17/2012  . Pulmonary emphysema (HCC) 10/17/2012  . Chronic pain 05/31/2012  . Breast mass 05/22/2012  . Urinary urgency 03/08/2012    History and Physical: For full details, please see admission history and physical. Briefly, Teresa Davenport is a 51 y.o. year old patient with left nephrolithiasis who was admitted after undergoing a left percutaneous nephrolithotomy.   Hospital Course: Patient tolerated the procedure well.  She was then transferred to the floor after an uneventful PACU stay.  On postoperative day #1, her percutaneous nephrostomy tube and Foley catheter removed. Overnight however she developed a fever and was treated for sepsis. On postoperative day #4 she was getting near ready for discharge home however overnight going into postoperative day #5 she left AGAINST MEDICAL ADVICE. Her left ureteral stent remains in place.  Laboratory values:  No results for input(s): WBC, HGB, HCT in the last 72 hours. No results for input(s): NA, K, CL, CO2, GLUCOSE, BUN, CREATININE, CALCIUM in the last 72 hours. No results for input(s): LABPT, INR in the last 72 hours. No results for input(s): LABURIN in the last 72 hours. Results for orders placed or performed during the hospital encounter of 07/29/16  Stone analysis     Status: None   Collection Time: 07/29/16 12:32 PM  Result Value Ref Range Status   Composition  Comment  Corrected    Comment: Percentage (Represents the % composition)   Nidus No Nidus visualized  Corrected   Stone Weight KSTONE 107.7 mg Final   Color Tan  Final   Size 7x5x4 mm Final   Ca Oxalate,Dihydrate 15 % Corrected   Ca Oxalate,Monohydr. 60 % Corrected   Ca phos cry stone ql IR 25 % Corrected   SURFACE CRYSTALS Comment:  Corrected    Comment: Calcium oxalate dihydrate   Photo Comment  Corrected    Comment: Photograph will follow under separate cover.   Comment: Comment  Corrected    Comment: (NOTE) Physician questions regarding Calculi Analysis contact [facility] at: [phone].    PLEASE NOTE: Comment  Corrected    Comment: (NOTE) Calculi report with photograph will follow via computer, mail or courier delivery.    Disclaimer - Kidney Stone Analysis: Comment  Corrected    Comment: (NOTE) This test was developed and its performance characteristics determined by [facility]. It has not been cleared or approved by the Food and Drug Administration. Performed At: [facility] [address] [place], [place] [id number] Mila Homer MD [id number]   Urine culture     Status: None   Collection Time: 07/31/16  3:30 PM  Result Value Ref Range Status   Specimen Description URINE, RANDOM  Final   Special Requests NONE  Final   Culture   Final    NO GROWTH Performed at [facility], [address] [place], [place] [address]    Report Status 08/02/2016 FINAL  Final    Disposition: Left hospital AMA  Discharge instruction: The patient was instructed to be  ambulatory but told to refrain from heavy lifting, strenuous activity, or driving.   Discharge medications:  Allergies as of 08/03/2016      Reactions   Meropenem Shortness Of Breath   Penicillins Anaphylaxis, Swelling   Has patient had a PCN reaction causing immediate rash, facial/tongue/throat swelling, SOB or lightheadedness with hypotension: Yes Has patient had a PCN reaction causing severe  rash involving mucus membranes or skin necrosis: No Has patient had a PCN reaction that required hospitalization No Has patient had a PCN reaction occurring within the last 10 years: No If all of the above answers are "NO", then may proceed with Cephalosporin use.   Doxycycline Swelling   Motrin [ibuprofen] Swelling   Tape    Patient says she cannot tolerate any tape but tegaderm   Toradol [ketorolac Tromethamine] Swelling   Tramadol Swelling   Amoxicillin Rash   Ketorolac Rash   Methadone Rash   Naproxen Sodium Rash      Medication List    STOP taking these medications   HYDROcodone-acetaminophen 5-325 MG tablet Commonly known as:  NORCO   tamsulosin 0.4 MG Caps capsule Commonly known as:  FLOMAX     TAKE these medications   acetaminophen 500 MG tablet Commonly known as:  TYLENOL Take 2,000 mg by mouth every 6 (six) hours as needed for moderate pain (patient takes 4 tablets at a time).   estrogens (conjugated) 0.45 MG tablet Commonly known as:  PREMARIN Take 0.45 mg by mouth daily.   oxyCODONE 5 MG immediate release tablet Commonly known as:  Oxy IR/ROXICODONE Take 2 tablets (10 mg total) by mouth 4 (four) times daily as needed for severe pain.       Followup:  Follow-up Information    Hildred LaserBrian James Emery Binz, MD In 1 week.   Specialty:  Urology Why:  stent removal Contact information: [address] [place] [address] [phone]

## 2016-08-12 ENCOUNTER — Ambulatory Visit (INDEPENDENT_AMBULATORY_CARE_PROVIDER_SITE_OTHER): Payer: Self-pay | Admitting: Urology

## 2016-08-12 VITALS — BP 122/79 | HR 83 | Ht 62.0 in | Wt 122.4 lb

## 2016-08-12 DIAGNOSIS — N132 Hydronephrosis with renal and ureteral calculous obstruction: Secondary | ICD-10-CM

## 2016-08-12 DIAGNOSIS — N200 Calculus of kidney: Secondary | ICD-10-CM

## 2016-08-12 LAB — URINALYSIS, COMPLETE
Bilirubin, UA: NEGATIVE
GLUCOSE, UA: NEGATIVE
Ketones, UA: NEGATIVE
Nitrite, UA: NEGATIVE
Specific Gravity, UA: 1.025 (ref 1.005–1.030)
Urobilinogen, Ur: 0.2 mg/dL (ref 0.2–1.0)
pH, UA: 6 (ref 5.0–7.5)

## 2016-08-12 LAB — MICROSCOPIC EXAMINATION: Epithelial Cells (non renal): >10 /hpf — AB (ref 0–10)

## 2016-08-12 MED ORDER — LIDOCAINE HCL 2 % EX GEL
1.0000 "application " | Freq: Once | CUTANEOUS | Status: AC
  Administered 2016-08-12: 1 via URETHRAL

## 2016-08-12 MED ORDER — CIPROFLOXACIN HCL 500 MG PO TABS: 500.0000 mg | ORAL_TABLET | Freq: Once | ORAL | Status: DC

## 2016-08-12 NOTE — Progress Notes (Signed)
   08/12/16  CC:  Chief Complaint  Patient presents with  . Cysto Stent Removal    HPI: The patient is a 51 year old female with history of nephrolithiasis status post PCNL in January 2018 who presents today for left ureteral stent removal. Purse postoperative course was palpated by sepsis which eventually resolved. She have a CT scan done during this time which showed the stent in place however she did have a residual proximal 5 mm stone in her left lower pole.  Blood pressure 122/79, pulse 83, height 5\' 2"  (1.575 m), weight 122 lb 6.4 oz (55.5 kg). NED. A&Ox3.   No respiratory distress   Abd soft, NT, ND Normal external genitalia with patent urethral meatus  Cystoscopy Procedure Note  Patient identification was confirmed, informed consent was obtained, and patient was prepped using Betadine solution.  Lidocaine jelly was administered per urethral meatus.    Preoperative abx where received prior to procedure.    Procedure: - Flexible cystoscope introduced, without any difficulty.   - Thorough search of the bladder revealed:    Left ureteral stent removed with flexible graspers intact per meatus  - Ureteral orifices were normal in position and appearance.  Post-Procedure: - Patient tolerated the procedure well  Assessment/ Plan:  1. Left nephrolithiasis s/p left PCNL The patient will follow-up with a renal ultrasound prior and one month Road iatrogenic hydronephrosis. An post PCNL CT she did have a small 5 mm stone in the lower pole of her kidney that at this time, we will likely watch this active surveillance.  Hildred LaserBrian James Ripley Bogosian, MD

## 2016-09-02 ENCOUNTER — Ambulatory Visit
Admission: RE | Admit: 2016-09-02 | Discharge: 2016-09-02 | Disposition: A | Discharge status: Home / Self Care | Payer: Self-pay | Source: Ambulatory Visit | Attending: Urology | Admitting: Urology

## 2016-09-02 DIAGNOSIS — N200 Calculus of kidney: Secondary | ICD-10-CM | POA: Insufficient documentation

## 2016-09-09 ENCOUNTER — Encounter: Payer: Self-pay | Admitting: Urology

## 2016-09-09 ENCOUNTER — Ambulatory Visit: Payer: Self-pay | Admitting: Urology

## 2016-12-19 ENCOUNTER — Ambulatory Visit
Admission: RE | Admit: 2016-12-19 | Discharge: 2016-12-19 | Disposition: A | Discharge status: Home / Self Care | Payer: Disability Insurance | Source: Ambulatory Visit | Attending: Family Medicine | Admitting: Family Medicine

## 2016-12-19 ENCOUNTER — Other Ambulatory Visit: Payer: Self-pay | Admitting: Family Medicine

## 2016-12-19 DIAGNOSIS — M2560 Stiffness of unspecified joint, not elsewhere classified: Secondary | ICD-10-CM

## 2017-03-31 DIAGNOSIS — Z5321 Procedure and treatment not carried out due to patient leaving prior to being seen by health care provider: Secondary | ICD-10-CM | POA: Diagnosis not present

## 2017-03-31 DIAGNOSIS — R109 Unspecified abdominal pain: Secondary | ICD-10-CM | POA: Diagnosis not present

## 2017-03-31 LAB — URINALYSIS, COMPLETE (UACMP) WITH MICROSCOPIC
BILIRUBIN URINE: NEGATIVE
Glucose, UA: NEGATIVE mg/dL
Ketones, ur: NEGATIVE mg/dL
Nitrite: POSITIVE — AB
PROTEIN: 30 mg/dL — AB
SPECIFIC GRAVITY, URINE: 1.016 (ref 1.005–1.030)
pH: 6 (ref 5.0–8.0)

## 2017-03-31 LAB — BASIC METABOLIC PANEL
Anion gap: 10 (ref 5–15)
BUN: 11 mg/dL (ref 6–20)
CHLORIDE: 102 mmol/L (ref 101–111)
CO2: 26 mmol/L (ref 22–32)
CREATININE: 0.62 mg/dL (ref 0.44–1.00)
Calcium: 9.6 mg/dL (ref 8.9–10.3)
GFR calc Af Amer: >60 mL/min (ref >60)
Glucose, Bld: 113 mg/dL — ABNORMAL HIGH (ref 65–99)
Potassium: 3.2 mmol/L — ABNORMAL LOW (ref 3.5–5.1)
SODIUM: 138 mmol/L (ref 135–145)

## 2017-03-31 LAB — CBC
HCT: 38.5 % (ref 35.0–47.0)
Hemoglobin: 13.6 g/dL (ref 12.0–16.0)
MCH: 32.1 pg (ref 26.0–34.0)
MCHC: 35.3 g/dL (ref 32.0–36.0)
MCV: 91 fL (ref 80.0–100.0)
PLATELETS: 261 10*3/uL (ref 150–440)
RBC: 4.23 MIL/uL (ref 3.80–5.20)
RDW: 13.9 % (ref 11.5–14.5)
WBC: 11.5 10*3/uL — AB (ref 3.6–11.0)

## 2017-03-31 NOTE — ED Triage Notes (Signed)
Pt to triage ambulatory with no distress noted. Pt reports she has hx of kidney stones and about 2 days ago she developed pain to her left flank region. Pt also reports she treated for a uti approx 3 weeks ago but still feels like she has a uti.

## 2017-04-01 ENCOUNTER — Emergency Department
Admission: EM | Admit: 2017-04-01 | Discharge: 2017-04-01 | Disposition: A | Discharge status: Home / Self Care | Payer: Medicaid Other | Attending: Emergency Medicine | Admitting: Emergency Medicine

## 2017-04-01 NOTE — ED Notes (Signed)
Pt states "take me off the list". Pt ambulated out of department.

## 2017-04-03 ENCOUNTER — Telehealth: Payer: Self-pay | Admitting: Emergency Medicine

## 2017-04-03 NOTE — Telephone Encounter (Signed)
Called patient due to lwot to inquire about condition and follow up plans. Left message.   

## 2017-05-16 ENCOUNTER — Other Ambulatory Visit: Payer: Self-pay

## 2017-05-16 DIAGNOSIS — Z79899 Other long term (current) drug therapy: Secondary | ICD-10-CM | POA: Diagnosis not present

## 2017-05-16 DIAGNOSIS — F1910 Other psychoactive substance abuse, uncomplicated: Secondary | ICD-10-CM | POA: Insufficient documentation

## 2017-05-16 DIAGNOSIS — N3000 Acute cystitis without hematuria: Secondary | ICD-10-CM | POA: Insufficient documentation

## 2017-05-16 DIAGNOSIS — F17210 Nicotine dependence, cigarettes, uncomplicated: Secondary | ICD-10-CM | POA: Insufficient documentation

## 2017-05-16 DIAGNOSIS — E876 Hypokalemia: Secondary | ICD-10-CM | POA: Diagnosis not present

## 2017-05-16 LAB — COMPREHENSIVE METABOLIC PANEL
ALBUMIN: 4.4 g/dL (ref 3.5–5.0)
ALT: 12 U/L — AB (ref 14–54)
AST: 29 U/L (ref 15–41)
Alkaline Phosphatase: 82 U/L (ref 38–126)
Anion gap: 12 (ref 5–15)
BUN: 15 mg/dL (ref 6–20)
CHLORIDE: 99 mmol/L — AB (ref 101–111)
CO2: 27 mmol/L (ref 22–32)
CREATININE: 0.89 mg/dL (ref 0.44–1.00)
Calcium: 9.5 mg/dL (ref 8.9–10.3)
GFR calc Af Amer: >60 mL/min (ref >60)
GFR calc non Af Amer: >60 mL/min (ref >60)
GLUCOSE: 132 mg/dL — AB (ref 65–99)
Potassium: 2.6 mmol/L — CL (ref 3.5–5.1)
SODIUM: 138 mmol/L (ref 135–145)
Total Bilirubin: 0.9 mg/dL (ref 0.3–1.2)
Total Protein: 7.7 g/dL (ref 6.5–8.1)

## 2017-05-16 LAB — CBC
HCT: 43 % (ref 35.0–47.0)
Hemoglobin: 14.7 g/dL (ref 12.0–16.0)
MCH: 31.3 pg (ref 26.0–34.0)
MCHC: 34 g/dL (ref 32.0–36.0)
MCV: 92 fL (ref 80.0–100.0)
PLATELETS: 283 10*3/uL (ref 150–440)
RBC: 4.68 MIL/uL (ref 3.80–5.20)
RDW: 13.1 % (ref 11.5–14.5)
WBC: 10.2 10*3/uL (ref 3.6–11.0)

## 2017-05-16 LAB — ETHANOL: Alcohol, Ethyl (B): <10 mg/dL (ref <10)

## 2017-05-16 NOTE — ED Triage Notes (Signed)
Pt brought in by police under involuntary commitment she states her son committed her because she tried molly's. She denies any SI or HI at this time, denies any other drug use but did have beer today.

## 2017-05-17 ENCOUNTER — Emergency Department
Admission: EM | Admit: 2017-05-17 | Discharge: 2017-05-17 | Disposition: A | Discharge status: Home / Self Care | Payer: Medicaid Other | Attending: Emergency Medicine | Admitting: Emergency Medicine

## 2017-05-17 DIAGNOSIS — N3000 Acute cystitis without hematuria: Secondary | ICD-10-CM

## 2017-05-17 DIAGNOSIS — E876 Hypokalemia: Secondary | ICD-10-CM

## 2017-05-17 DIAGNOSIS — F1910 Other psychoactive substance abuse, uncomplicated: Secondary | ICD-10-CM

## 2017-05-17 LAB — URINALYSIS, COMPLETE (UACMP) WITH MICROSCOPIC
Bilirubin Urine: NEGATIVE
GLUCOSE, UA: NEGATIVE mg/dL
HGB URINE DIPSTICK: NEGATIVE
Ketones, ur: NEGATIVE mg/dL
NITRITE: POSITIVE — AB
Protein, ur: 100 mg/dL — AB
SPECIFIC GRAVITY, URINE: 1.021 (ref 1.005–1.030)
pH: 6 (ref 5.0–8.0)

## 2017-05-17 LAB — URINE DRUG SCREEN, QUALITATIVE (ARMC ONLY)
AMPHETAMINES, UR SCREEN: POSITIVE — AB
Barbiturates, Ur Screen: NOT DETECTED
Benzodiazepine, Ur Scrn: NOT DETECTED
CANNABINOID 50 NG, UR ~~LOC~~: NOT DETECTED
COCAINE METABOLITE, UR ~~LOC~~: POSITIVE — AB
MDMA (ECSTASY) UR SCREEN: NOT DETECTED
Methadone Scn, Ur: NOT DETECTED
Opiate, Ur Screen: NOT DETECTED
PHENCYCLIDINE (PCP) UR S: NOT DETECTED
TRICYCLIC, UR SCREEN: NOT DETECTED

## 2017-05-17 MED ORDER — POTASSIUM CHLORIDE CRYS ER 20 MEQ PO TBCR
40.0000 meq | EXTENDED_RELEASE_TABLET | Freq: Once | ORAL | Status: DC
  Filled 2017-05-17: qty 2

## 2017-05-17 MED ORDER — POTASSIUM CHLORIDE CRYS ER 20 MEQ PO TBCR
EXTENDED_RELEASE_TABLET | ORAL | Status: AC
  Administered 2017-05-17: 07:00:00
  Filled 2017-05-17: qty 1

## 2017-05-17 MED ORDER — SULFAMETHOXAZOLE-TRIMETHOPRIM 800-160 MG PO TABS: 1.0000 | ORAL_TABLET | Freq: Two times a day (BID) | ORAL | 0 refills | Status: DC

## 2017-05-17 MED ORDER — SULFAMETHOXAZOLE-TRIMETHOPRIM 800-160 MG PO TABS
1.0000 | ORAL_TABLET | Freq: Once | ORAL | Status: DC
  Filled 2017-05-17 (×2): qty 1

## 2017-05-17 MED ORDER — ONDANSETRON 4 MG PO TBDP
4.0000 mg | ORAL_TABLET | Freq: Once | ORAL | Status: DC
  Filled 2017-05-17 (×3): qty 1

## 2017-05-17 MED ORDER — POTASSIUM CHLORIDE 20 MEQ PO PACK
40.0000 meq | PACK | Freq: Once | ORAL | Status: AC
  Administered 2017-05-17: 40 meq via ORAL
  Filled 2017-05-17: qty 2

## 2017-05-17 MED ORDER — SULFAMETHOXAZOLE-TRIMETHOPRIM 800-160 MG PO TABS
1.0000 | ORAL_TABLET | Freq: Once | ORAL | Status: AC
  Administered 2017-05-17: 1 via ORAL
  Filled 2017-05-17: qty 1

## 2017-05-17 MED ORDER — POTASSIUM CHLORIDE 20 MEQ PO PACK: 40.0000 meq | PACK | Freq: Two times a day (BID) | ORAL | Status: DC

## 2017-05-17 MED ORDER — POTASSIUM CHLORIDE ER 10 MEQ PO TBCR: 10.0000 meq | EXTENDED_RELEASE_TABLET | Freq: Every day | ORAL | 0 refills | Status: AC

## 2017-05-17 MED ORDER — POTASSIUM CHLORIDE 20 MEQ PO PACK
40.0000 meq | PACK | Freq: Once | ORAL | Status: DC
  Filled 2017-05-17: qty 2

## 2017-05-17 MED ORDER — ACETAMINOPHEN 325 MG PO TABS
650.0000 mg | ORAL_TABLET | Freq: Once | ORAL | Status: AC
  Administered 2017-05-17: 650 mg via ORAL
  Filled 2017-05-17: qty 2

## 2017-05-17 NOTE — ED Notes (Addendum)
Patient admitted to unit IVC'd by son for using Molly's.  Patient states, "This is [employer].  I had my son arrested for using drugs so it's payback."  Patient dies any SI/HI/AVH.  IVC paperwork states that patient has a poor appetite.  Patient states she ate [employer] for dinner and denies any issues with appetite.

## 2017-05-17 NOTE — ED Notes (Signed)
Received call from patient's son stating that mother "is manipulative and will lie her way out of here."  Patient states that mother has a long history of addiction to pain medications.  She lost her grandmother in 2017.  She also went through a divorce in 2012 that she is dealing with.  He states she has made homicidal and suicidal statements in the past.

## 2017-05-17 NOTE — ED Notes (Signed)
Pt anxiously waiting to be discharged upon approach. "I need to get out of here. I have things to do."  Pt denies SI/HI and AVH.  Pleasant and cooperative with staff. Maintained on 15 minute checks and observation by security camera for safety.

## 2017-05-17 NOTE — ED Notes (Signed)
Pt discharged to lobby with cab voucher. Pt denies SI/HI and AVH.  All belongings returned to patient. Pt signed for discharge instructions. Prescriptions given to patient.

## 2017-05-17 NOTE — ED Provider Notes (Addendum)
[facility] Emergency Department Provider Note    First MD Initiated Contact with Patient 05/17/17 0100     (approximate)  I have reviewed the triage vital signs and the nursing notes.   HISTORY  Chief Complaint Psychiatric Evaluation   HPI Teresa Davenport is a 51 y.o. female with low-dose of chronic medical conditions presents to the emergency department in police custody secondary to involuntary commitment. Patient denies any suicidal or homicidal ideation. Patient does however admit to "using Molly on Saturday and Sunday night". Patient denies any other illicit drug use.   Past Medical History:  Diagnosis Date  . Breast mass 05/22/2012  . Chronic cystitis 10/15/2014  . Chronic pain 05/31/2012  . Complication of anesthesia    hard for me to go under   . Gross hematuria 09/17/2014  . History of kidney stones   . Hydronephrosis with urinary obstruction due to ureteral calculus 04/29/2016  . Hypokalemia 03/23/2016  . Kidney stone   . Meningitis 2013  . Pneumonia 2014  . Pyelonephritis 04/17/2016  . Renal colic 09/17/2014  . Renal disorder   . Rib fracture   . Traumatic brain injury (HCC) 2014  . Ureterolithiasis 04/29/2016    Patient Active Problem List   Diagnosis Date Noted  . Nephrolithiasis 07/29/2016  . Hydronephrosis with urinary obstruction due to ureteral calculus 04/29/2016  . Ureterolithiasis 04/29/2016  . Pyelonephritis 04/17/2016  . Kidney stone 04/16/2016  . Hypokalemia 03/23/2016  . Bladder pain 10/15/2014  . Chronic cystitis 10/15/2014  . Gross hematuria 09/17/2014  . Renal colic 09/17/2014  . Headache 10/17/2012  . Pulmonary emphysema (HCC) 10/17/2012  . Chronic pain 05/31/2012  . Breast mass 05/22/2012  . Urinary urgency 03/08/2012    Past Surgical History:  Procedure Laterality Date  . ABDOMINAL HYSTERECTOMY    . BREAST SURGERY Bilateral 2016   breast implants  . CLAVICLE SURGERY    . FACIAL FRACTURE SURGERY       baseball accident- pins placed  . FEMUR FRACTURE SURGERY    . FRACTURE SURGERY  2001   face  . IR GENERIC HISTORICAL  07/29/2016   IR NEPHROSTOMY PLACEMENT LEFT 07/29/2016 [facility]-INTERV RAD  . RIB FRACTURE SURGERY  08/2015   pins and screws    Prior to Admission medications   Medication Sig Start Date End Date Taking? Authorizing Provider  acetaminophen (TYLENOL) 500 MG tablet Take 2,000 mg by mouth every 6 (six) hours as needed for moderate pain (patient takes 4 tablets at a time).     [provider]  estrogens, conjugated, (PREMARIN) 0.45 MG tablet Take 0.45 mg by mouth daily.     [provider]  oxyCODONE (OXY IR/ROXICODONE) 5 MG immediate release tablet Take 2 tablets (10 mg total) by mouth 4 (four) times daily as needed for severe pain. Patient not taking: Reported on 08/12/2016 07/29/16   Hildred LaserBudzyn, Brian James, MD    Allergies Meropenem; Penicillins; Doxycycline; Motrin [ibuprofen]; Tape; Toradol [ketorolac tromethamine]; Tramadol; Amoxicillin; Ketorolac; Methadone; and Naproxen sodium  Family History  Problem Relation Age of Onset  . Kidney disease Mother   . Pancreatic cancer Father   . Bladder Cancer Neg Hx   . Kidney cancer Neg Hx   . Prostate cancer Neg Hx     Social History Social History   Tobacco Use  . Smoking status: Current Every Day Smoker    Packs/day: 0.25    Types: Cigarettes  . Smokeless tobacco: Never Used  Substance Use Topics  .  Alcohol use: No  . Drug use: No    Review of Systems Constitutional: No fever/chills Eyes: No visual changes. ENT: No sore throat. Cardiovascular: Denies chest pain. Respiratory: Denies shortness of breath. Gastrointestinal: No abdominal pain.  No nausea, no vomiting.  No diarrhea.  No constipation. Genitourinary: Negative for dysuria. Musculoskeletal: Negative for neck pain.  Negative for back pain. Integumentary: Negative for rash. Neurological: Negative for headaches, focal weakness or  numbness. Psychiatric:Positive for substance abuse   ____________________________________________   PHYSICAL EXAM:  VITAL SIGNS: ED Triage Vitals [05/16/17 2308]  Enc Vitals Group     BP 139/89     Pulse Rate (!) 101     Resp 20     Temp 98.4 F (36.9 C)     Temp Source Oral     SpO2 99 %     Weight 54.4 kg (120 lb)     Height 1.575 m (5\' 2" )     Head Circumference      Peak Flow      Pain Score      Pain Loc      Pain Edu?      Excl. in GC?     Constitutional: Alert and oriented. Well appearing and in no acute distress. Eyes: Conjunctivae are normal. Head: Atraumatic. Mouth/Throat: Mucous membranes are moist.  Oropharynx non-erythematous. Neck: No stridor.   Cardiovascular: Normal rate, regular rhythm. Good peripheral circulation. Grossly normal heart sounds. Respiratory: Normal respiratory effort.  No retractions. Lungs CTAB. Gastrointestinal: Soft and nontender. No distention.  Musculoskeletal: No lower extremity tenderness nor edema. No gross deformities of extremities. Neurologic:  Normal speech and language. No gross focal neurologic deficits are appreciated.  Skin:  Skin is warm, dry and intact. No rash noted. Psychiatric: Mood and affect are normal. Speech and behavior are normal.  ____________________________________________   LABS (all labs ordered are listed, but only abnormal results are displayed)  Labs Reviewed  COMPREHENSIVE METABOLIC PANEL - Abnormal; Notable for the following components:      Result Value   Potassium 2.6 (*)    Chloride 99 (*)    Glucose, Bld 132 (*)    ALT 12 (*)    All other components within normal limits  URINALYSIS, COMPLETE (UACMP) WITH MICROSCOPIC - Abnormal; Notable for the following components:   Color, Urine AMBER (*)    APPearance CLOUDY (*)    Protein, ur 100 (*)    Nitrite POSITIVE (*)    Leukocytes, UA MODERATE (*)    Bacteria, UA MANY (*)    Squamous Epithelial / LPF 6-30 (*)    All other components  within normal limits  URINE DRUG SCREEN, QUALITATIVE ([facility] ONLY) - Abnormal; Notable for the following components:   Amphetamines, Ur Screen POSITIVE (*)    Cocaine Metabolite,Ur [place] POSITIVE (*)    All other components within normal limits  CBC  ETHANOL      Procedures   ____________________________________________   INITIAL IMPRESSION / ASSESSMENT AND PLAN / ED COURSE  As part of my medical decision making, I reviewed the following data within the electronic MEDICAL RECORD [id number] year old female presenting involuntary committed secondary to polysubstance abuse paranoid and delusional thoughts". Patient denies any suicidal or homicidal ideation. Patient admitted to using "Molly" but denied any other illicit drug use. Laboratory data positive for amphetamines and cocaine. Patient's urine consistent with urinary tract infection associated with given Keflex will be prescribed same for home. In addition patient noted to be hypokalemic with a potassium of  2.6 and a such was given potassium chloride 40 mEq by mouth 2 doses. Patient awaiting psychiatry consultation at this time Clinical Course as of May 18 1306  Wed May 17, 2017  0809 Patient was evaluated by telepsychiatry and recommended to reverse the commitment.   [JW]    Clinical Course User Index [JW] Emily FilbertWilliams, Jonathan E, MD    ____________________________________________  FINAL CLINICAL IMPRESSION(S) / ED DIAGNOSES  Final diagnoses:  Polysubstance abuse (HCC)  Acute cystitis without hematuria  Hypokalemia     MEDICATIONS GIVEN DURING THIS VISIT:  Medications  sulfamethoxazole-trimethoprim (BACTRIM DS,SEPTRA DS) 800-160 MG per tablet 1 tablet (not administered)  ondansetron (ZOFRAN-ODT) disintegrating tablet 4 mg (not administered)  potassium chloride SA (K-DUR,KLOR-CON) CR tablet 40 mEq (not administered)  potassium chloride (KLOR-CON) packet 40 mEq (40 mEq Oral Given 05/17/17 0141)  acetaminophen (TYLENOL) tablet 650 mg  (650 mg Oral Given 05/17/17 0142)     ED Discharge Orders    None       Note:  This document was prepared using Dragon voice recognition software and may include unintentional dictation errors.    Darci CurrentBrown, Union N, MD 05/17/17 [id number]    Darci CurrentBrown, Elgin N, MD 05/18/17 [phone]

## 2017-05-17 NOTE — ED Notes (Signed)
SOC complete.  

## 2017-05-17 NOTE — ED Notes (Signed)
SOC in progress with Dr Onalee HuaAlvarez.

## 2018-02-03 IMAGING — US US RENAL
1 series · 14 of 25 positions shown · non-contrast
Comparison: CT 04/15/2016

CLINICAL DATA: History of nephrolithiasis.  Follow-up.

EXAM:
RENAL / URINARY TRACT ULTRASOUND COMPLETE

[Series 1: us renal · 0.21mm/px · 14 of 57 slices shown]
[im 1/57]
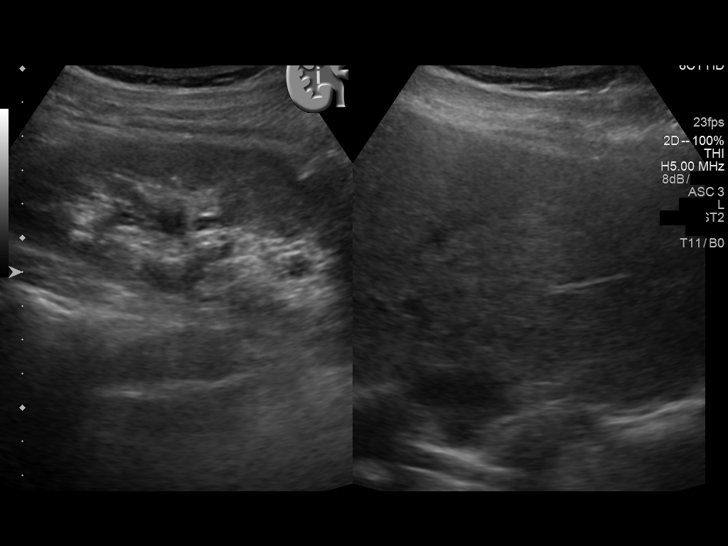
[im 5/57]
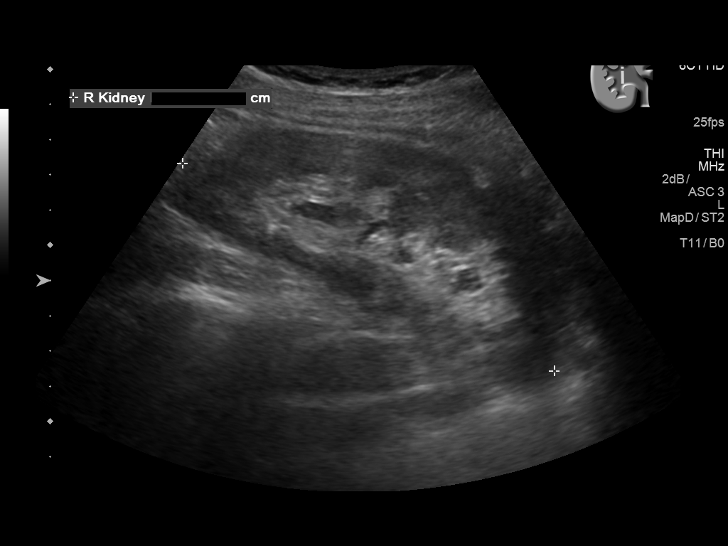
[im 10/57]
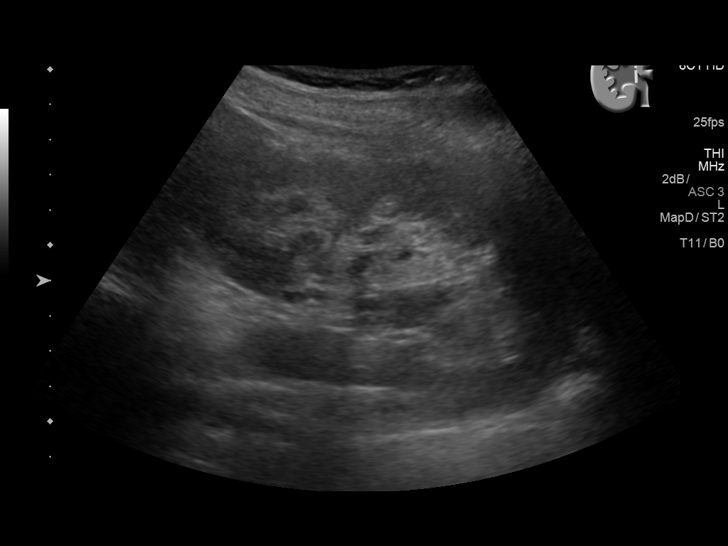
[im 15/57]
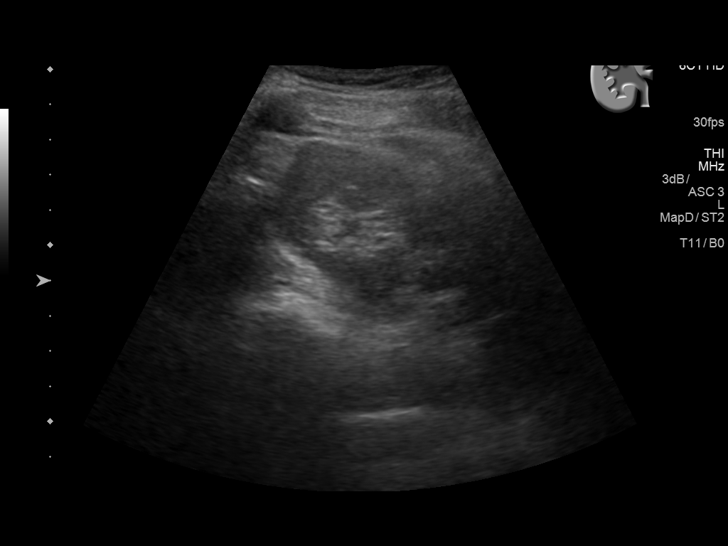
[im 19/57]
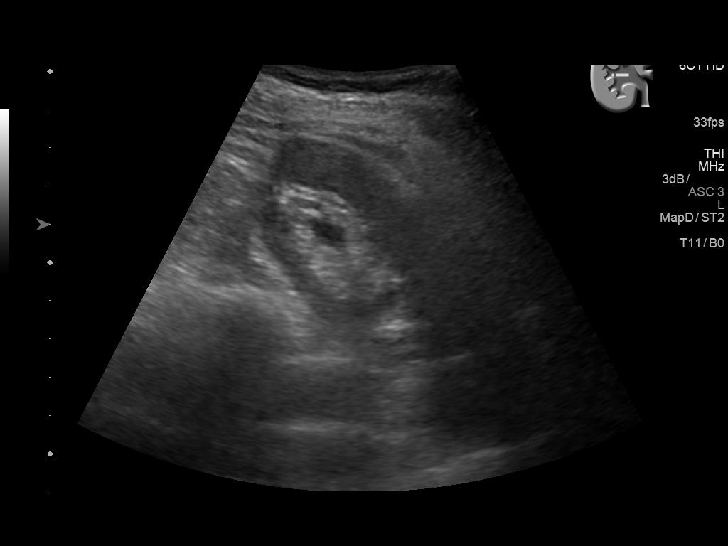
[im 22/57]
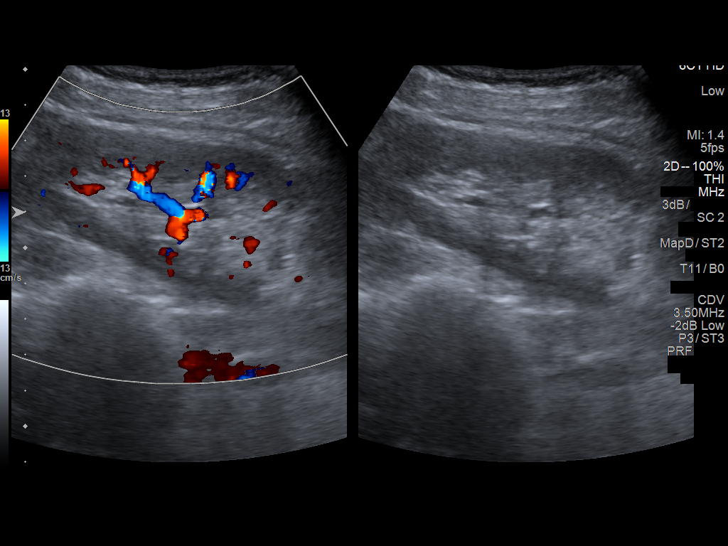
[im 26/57]
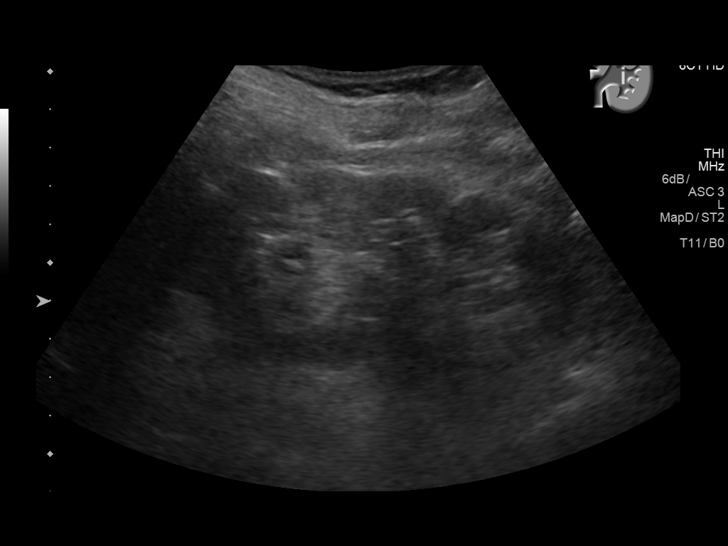
[im 31/57]
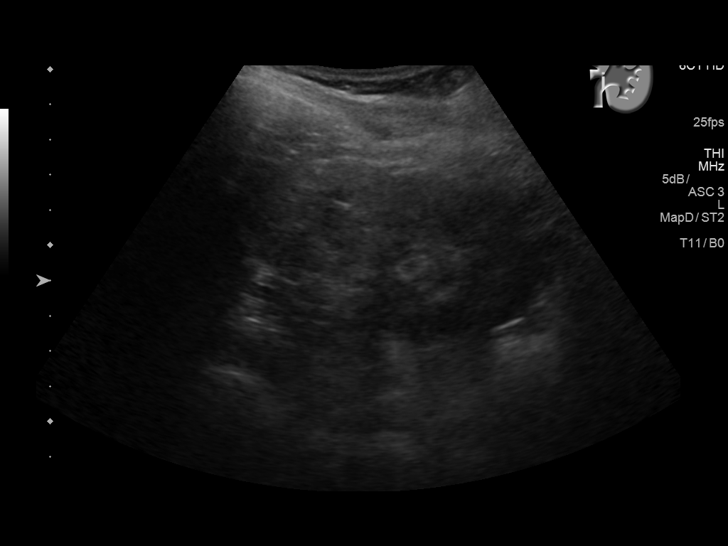
[im 36/57]
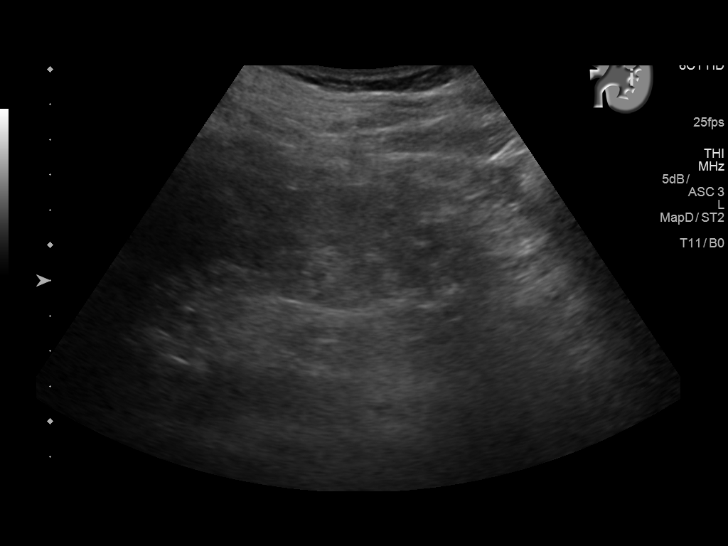
[im 38/57]
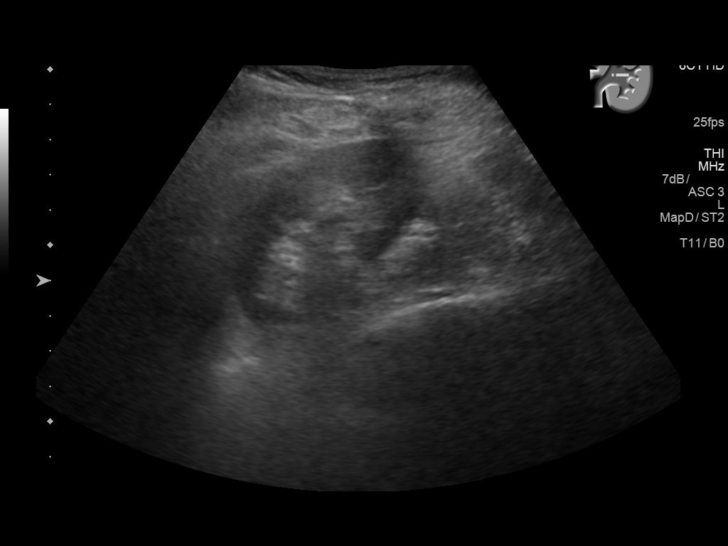
[im 43/57]
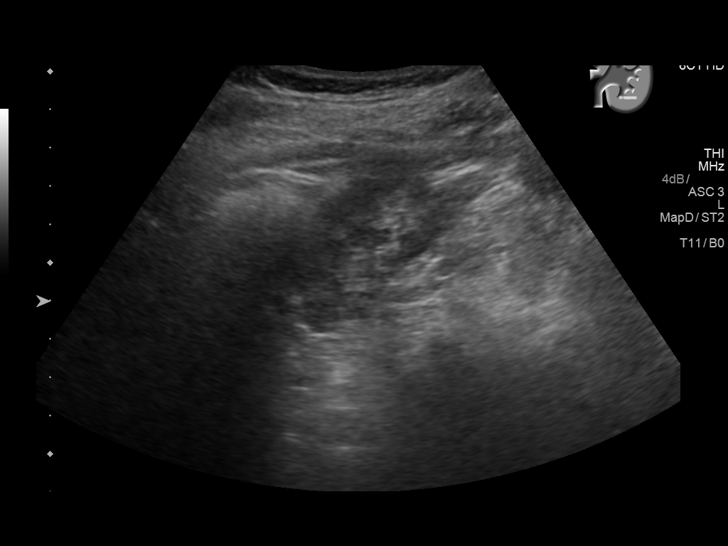
[im 47/57]
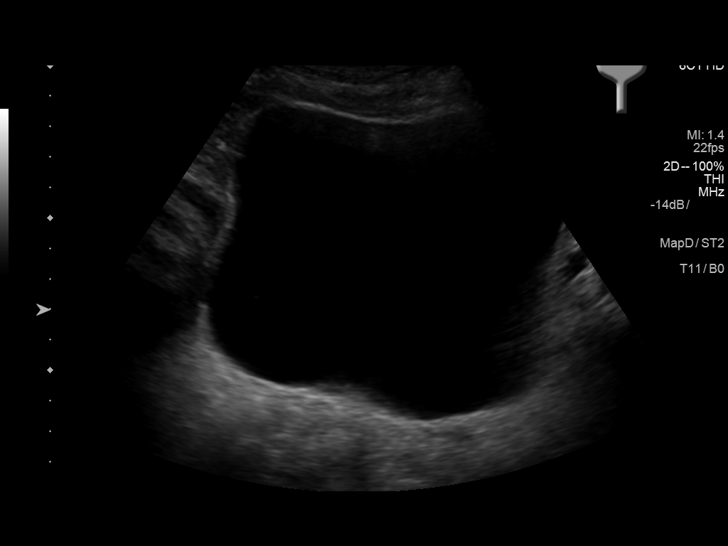
[im 52/57]
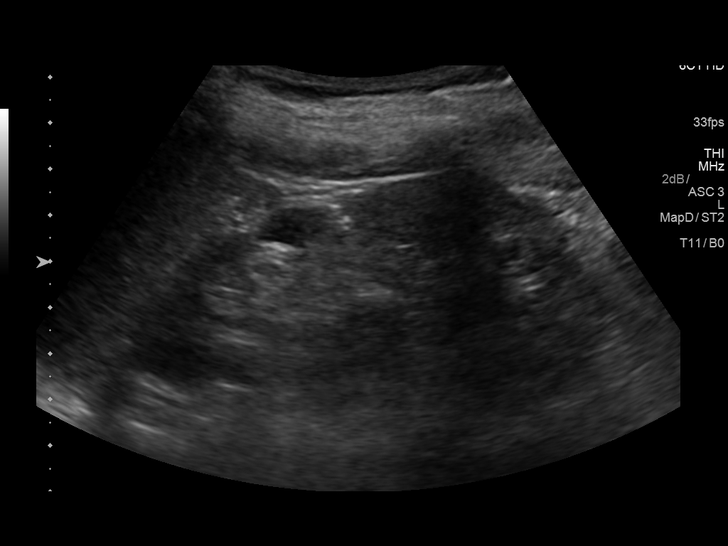
[im 57/57]
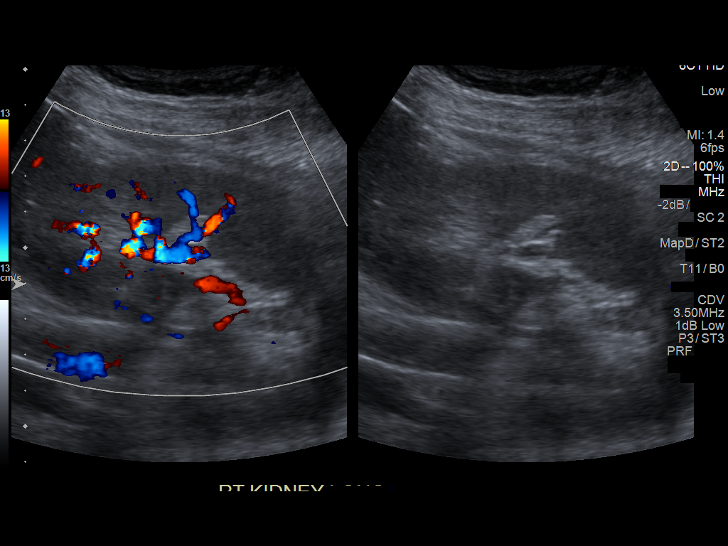

[14 of 25 positions shown; findings below may reference images not displayed]

FINDINGS: Right Kidney:

Length: 12.1 cm.. Mild fullness of the renal collecting system,
certainly less than was seen on the CT scan of 2 months ago. No
shadowing stones. No parenchymal abnormality.

Left Kidney:

Length: 10.5 cm. 2 cm upper pole cyst.. Atrophy in the upper pole.
No shadowing stones are demonstrated by ultrasound. Small stones
were present on the previous CT. No hydronephrosis on the left.

Bladder:

Bilateral ureteral jets visible.  No bladder stone seen.
IMPRESSION: Resolution of previously seen right hydronephrosis. Very mild
fullness the right renal collecting system presently. No stone
identified in either kidney by ultrasound, though multiple left
renal stones are shown on the recent CT. Bilateral ureteral jets
demonstrated.

## 2018-04-23 IMAGING — US US RENAL
1 series · 14 of 25 positions shown · non-contrast
Comparison: CT 07/31/2016, 04/15/2016.  Ultrasound 06/15/2016.

CLINICAL DATA: Nephrolithiasis.

EXAM:
RENAL / URINARY TRACT ULTRASOUND COMPLETE

[Series 1: us renal · 0.20mm/px · 14 of 44 slices shown]
[im 1/44]
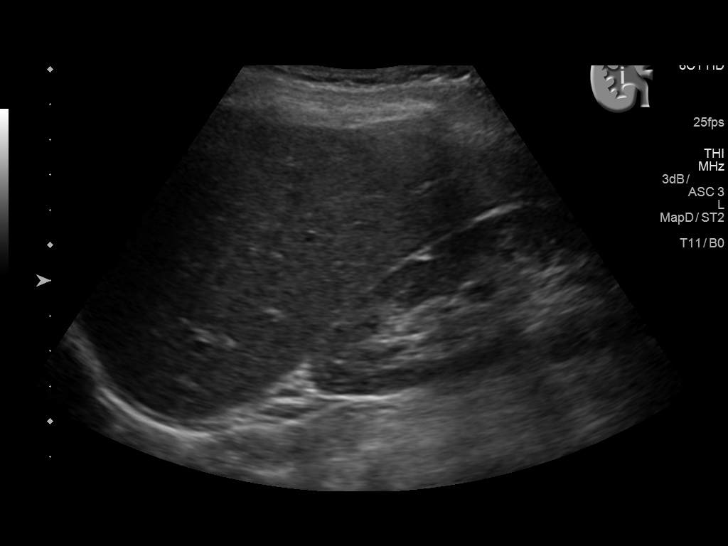
[im 4/44]
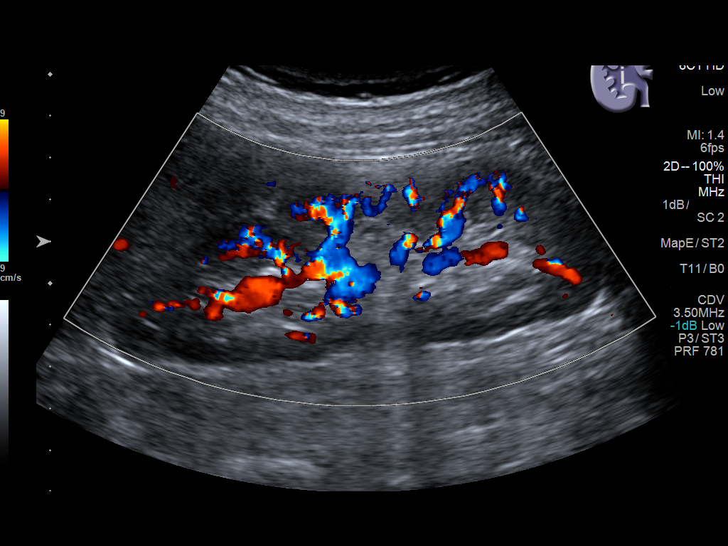
[im 8/44]
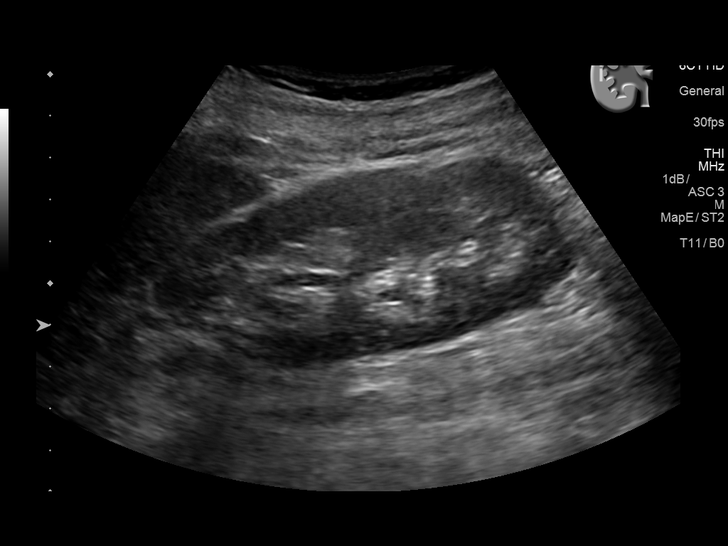
[im 11/44]
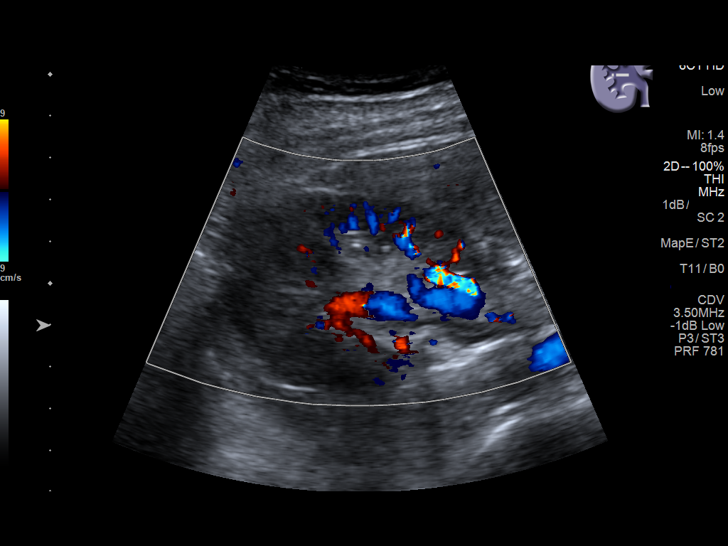
[im 15/44]
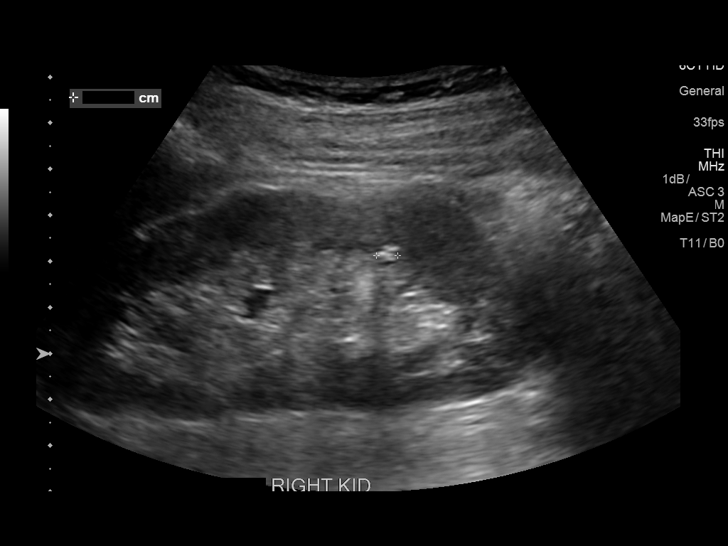
[im 17/44]
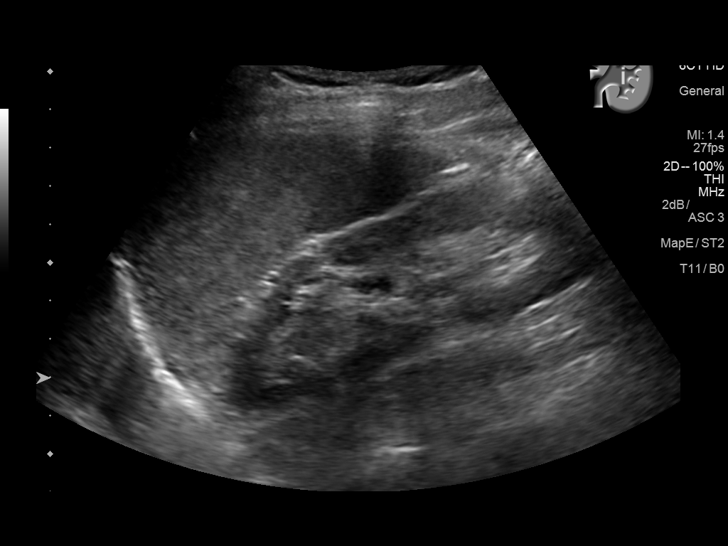
[im 20/44]
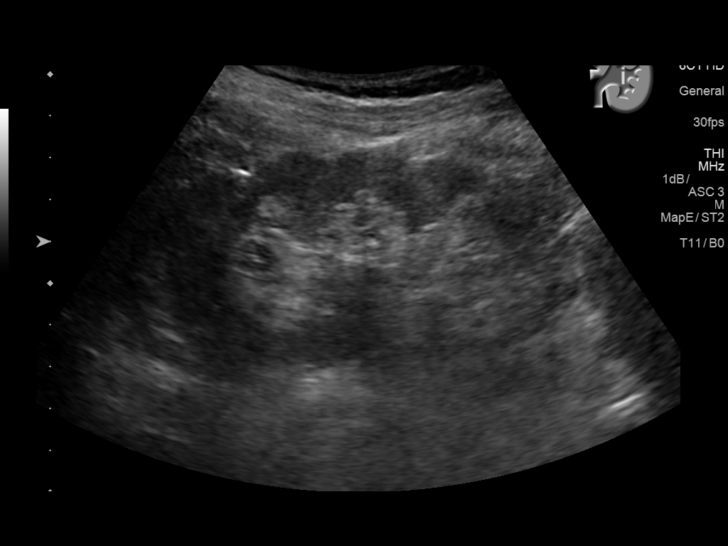
[im 24/44]
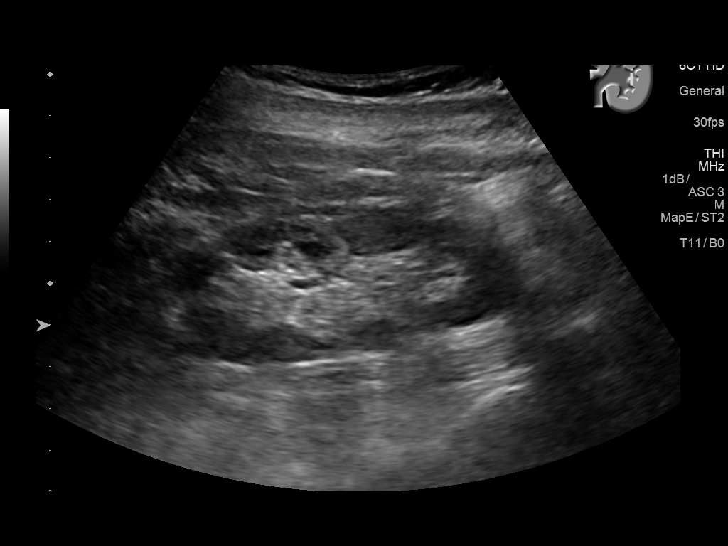
[im 27/44]
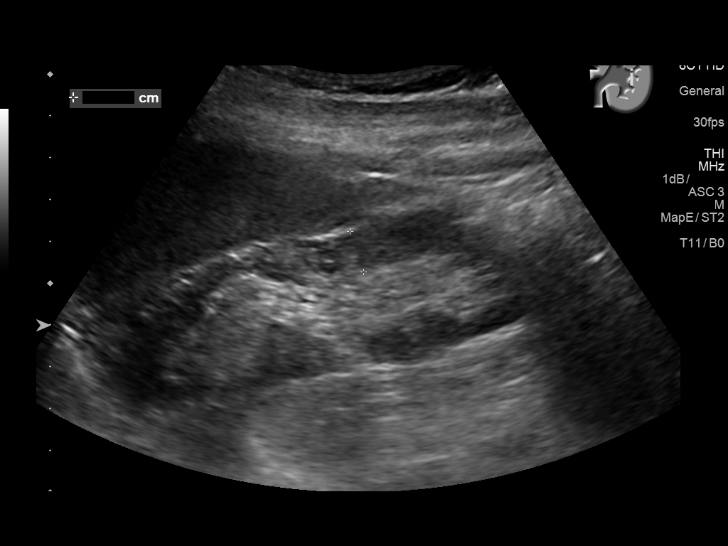
[im 29/44]
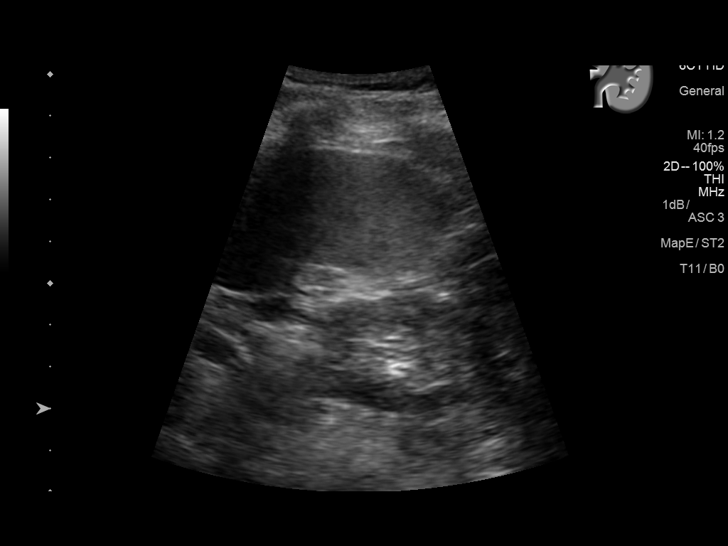
[im 33/44]
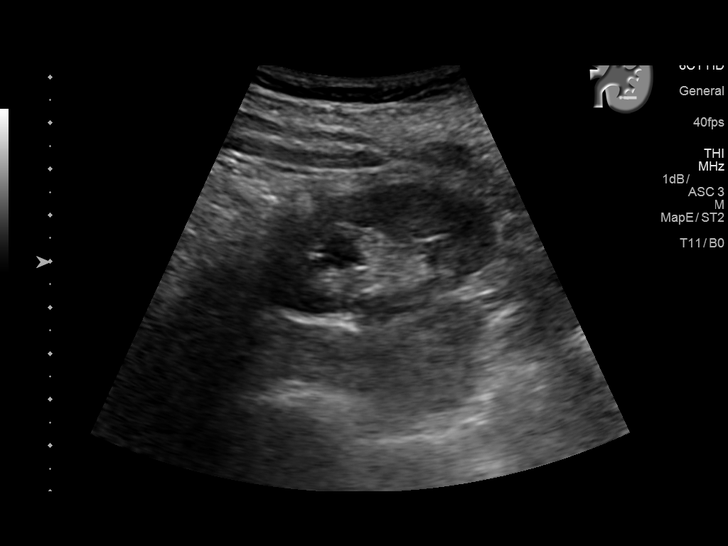
[im 36/44]
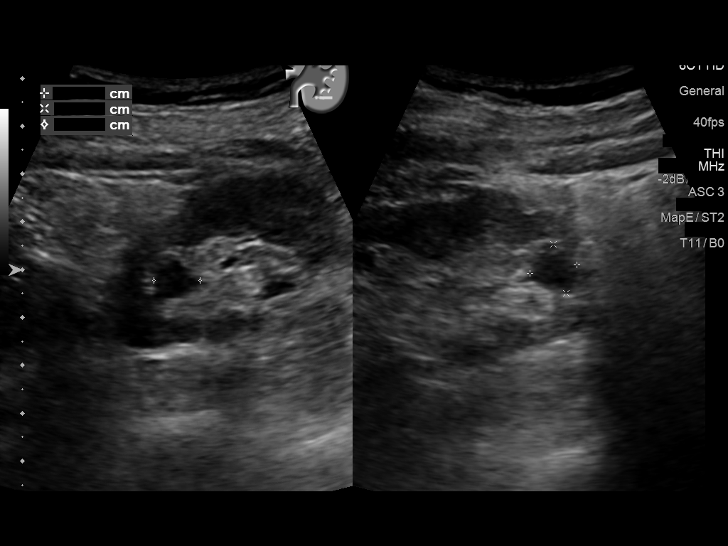
[im 40/44]
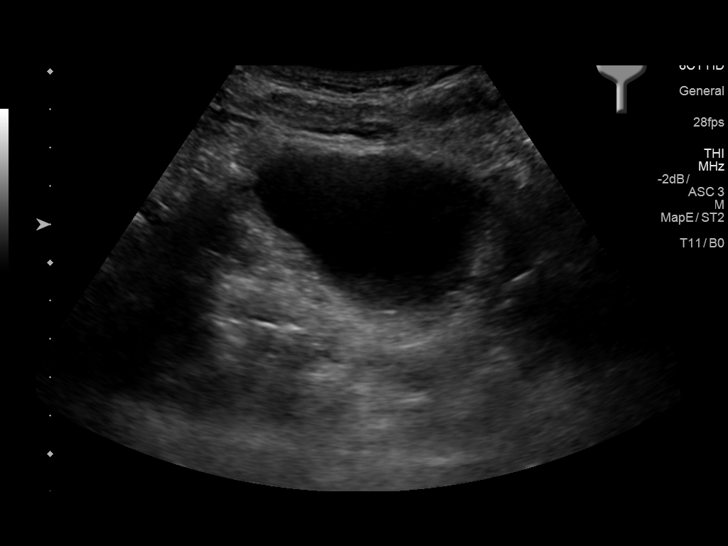
[im 44/44]
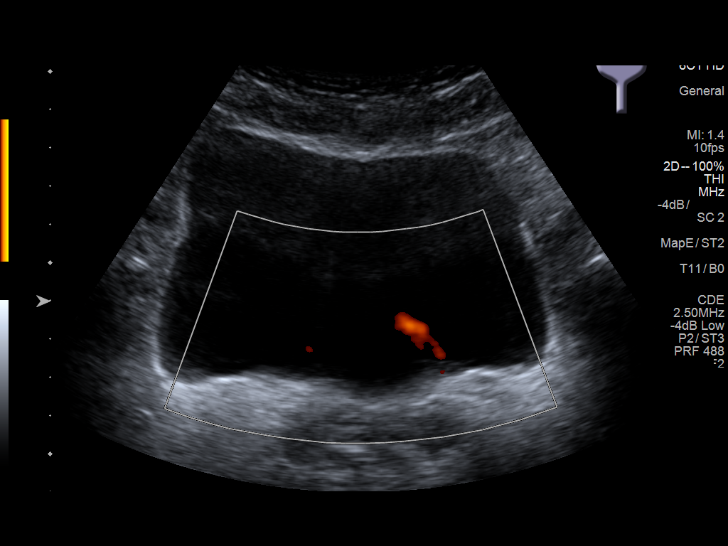

[14 of 25 positions shown; findings below may reference images not displayed]

FINDINGS: Right Kidney:

Length: 12.0 cm. Echogenicity within normal limits. No mass or
hydronephrosis visualized. Tiny nonobstructing renal calyceal stones
measuring up to 4 mm.

Left Kidney:

Length: 10.1 cm. Echogenicity within normal limits. No solid mass or
hydronephrosis visualized. 1.1 cm simple cyst lower pole left
kidney.

Bladder:

Appears normal for degree of bladder distention.
IMPRESSION: 1. Tiny nonobstructing right renal calyceal stones measuring up to 4
mm. Small 1.1 cm simple cyst lower pole left kidney, stable from
prior exam.

2. No acute abnormality .

## 2018-08-25 ENCOUNTER — Other Ambulatory Visit: Payer: Self-pay

## 2018-08-25 ENCOUNTER — Emergency Department
Admission: EM | Admit: 2018-08-25 | Discharge: 2018-08-26 | Disposition: A | Discharge status: Home / Self Care | Payer: Medicaid Other | Attending: Emergency Medicine | Admitting: Emergency Medicine

## 2018-08-25 ENCOUNTER — Encounter: Payer: Self-pay | Admitting: Emergency Medicine

## 2018-08-25 ENCOUNTER — Emergency Department: Payer: Medicaid Other

## 2018-08-25 DIAGNOSIS — F17210 Nicotine dependence, cigarettes, uncomplicated: Secondary | ICD-10-CM | POA: Insufficient documentation

## 2018-08-25 DIAGNOSIS — R1011 Right upper quadrant pain: Secondary | ICD-10-CM | POA: Insufficient documentation

## 2018-08-25 DIAGNOSIS — R0789 Other chest pain: Secondary | ICD-10-CM | POA: Diagnosis not present

## 2018-08-25 LAB — BASIC METABOLIC PANEL
ANION GAP: 10 (ref 5–15)
BUN: 13 mg/dL (ref 6–20)
CHLORIDE: 102 mmol/L (ref 98–111)
CO2: 26 mmol/L (ref 22–32)
Calcium: 9.6 mg/dL (ref 8.9–10.3)
Creatinine, Ser: 0.68 mg/dL (ref 0.44–1.00)
Glucose, Bld: 212 mg/dL — ABNORMAL HIGH (ref 70–99)
POTASSIUM: 3.4 mmol/L — AB (ref 3.5–5.1)
SODIUM: 138 mmol/L (ref 135–145)

## 2018-08-25 LAB — CBC
HEMATOCRIT: 44.4 % (ref 36.0–46.0)
Hemoglobin: 14.7 g/dL (ref 12.0–15.0)
MCH: 30.8 pg (ref 26.0–34.0)
MCHC: 33.1 g/dL (ref 30.0–36.0)
MCV: 93.1 fL (ref 80.0–100.0)
NRBC: 0 % (ref 0.0–0.2)
Platelets: 294 10*3/uL (ref 150–400)
RBC: 4.77 MIL/uL (ref 3.87–5.11)
RDW: 12.8 % (ref 11.5–15.5)
WBC: 9.4 10*3/uL (ref 4.0–10.5)

## 2018-08-25 LAB — POCT PREGNANCY, URINE: Preg Test, Ur: NEGATIVE

## 2018-08-25 LAB — FIBRIN DERIVATIVES D-DIMER (ARMC ONLY): Fibrin derivatives D-dimer (ARMC): 461.92 ng/mL (FEU) (ref 0.00–499.00)

## 2018-08-25 LAB — TROPONIN I

## 2018-08-25 MED ORDER — HALOPERIDOL LACTATE 5 MG/ML IJ SOLN
5.0000 mg | Freq: Once | INTRAMUSCULAR | Status: AC
  Administered 2018-08-25: 5 mg via INTRAVENOUS
  Filled 2018-08-25: qty 1

## 2018-08-25 MED ORDER — ALUM & MAG HYDROXIDE-SIMETH 200-200-20 MG/5ML PO SUSP
30.0000 mL | Freq: Once | ORAL | Status: AC
  Administered 2018-08-25: 30 mL via ORAL
  Filled 2018-08-25: qty 30

## 2018-08-25 MED ORDER — SODIUM CHLORIDE 0.9 % IV BOLUS
1000.0000 mL | Freq: Once | INTRAVENOUS | Status: AC
  Administered 2018-08-25: 1000 mL via INTRAVENOUS

## 2018-08-25 MED ORDER — SODIUM CHLORIDE 0.9% FLUSH: 3.0000 mL | Freq: Once | INTRAVENOUS | Status: DC

## 2018-08-25 MED ORDER — FENTANYL CITRATE (PF) 100 MCG/2ML IJ SOLN
50.0000 ug | Freq: Once | INTRAMUSCULAR | Status: AC
Start: 2018-08-25 — End: 2018-08-25
  Administered 2018-08-25: 50 ug via INTRAVENOUS
  Filled 2018-08-25: qty 2

## 2018-08-25 MED ORDER — FENTANYL CITRATE (PF) 100 MCG/2ML IJ SOLN
50.0000 ug | Freq: Once | INTRAMUSCULAR | Status: AC
  Administered 2018-08-25: 50 ug via INTRAVENOUS
  Filled 2018-08-25: qty 2

## 2018-08-25 MED ORDER — LIDOCAINE VISCOUS HCL 2 % MT SOLN
15.0000 mL | Freq: Once | OROMUCOSAL | Status: AC
  Administered 2018-08-25: 15 mL via OROMUCOSAL
  Filled 2018-08-25: qty 15

## 2018-08-25 MED ORDER — IOHEXOL 350 MG/ML SOLN
75.0000 mL | Freq: Once | INTRAVENOUS | Status: AC | PRN
  Administered 2018-08-25: 75 mL via INTRAVENOUS

## 2018-08-25 MED ORDER — LIDOCAINE VISCOUS HCL 2 % MT SOLN
15.0000 mL | Freq: Once | OROMUCOSAL | Status: AC
  Administered 2018-08-25: 15 mL via ORAL

## 2018-08-25 NOTE — ED Triage Notes (Signed)
Pt arrived via EMS from home with SOB and right sided under breast rib pain x1 day. Pt is guarding and holding area. Pt not able to take deep breath.

## 2018-08-25 NOTE — ED Provider Notes (Signed)
[facility] Emergency Department Provider Note   ____________________________________________   I have reviewed the triage vital signs and the nursing notes.   HISTORY  Chief Complaint Chest Pain   History limited by: Not Limited   HPI Teresa Davenport is a 53 y.o. female who presents to the emergency department today because of concerns for right upper quadrant/right lower chest pain.  Patient states that the pain started yesterday.  It is sharp.  It is severe.  Has been accompanied by some nausea.  The patient denies any fevers.  Denies any pain like this in the past.  States she does have some family history of gallbladder disease.  Per medical record review patient has a history of kidney stones  Past Medical History:  Diagnosis Date  . Breast mass 05/22/2012  . Chronic cystitis 10/15/2014  . Chronic pain 05/31/2012  . Complication of anesthesia    hard for me to go under   . Gross hematuria 09/17/2014  . History of kidney stones   . Hydronephrosis with urinary obstruction due to ureteral calculus 04/29/2016  . Hypokalemia 03/23/2016  . Kidney stone   . Meningitis 2013  . Pneumonia 2014  . Pyelonephritis 04/17/2016  . Renal colic 09/17/2014  . Renal disorder   . Rib fracture   . Traumatic brain injury (HCC) 2014  . Ureterolithiasis 04/29/2016    Patient Active Problem List   Diagnosis Date Noted  . Nephrolithiasis 07/29/2016  . Hydronephrosis with urinary obstruction due to ureteral calculus 04/29/2016  . Ureterolithiasis 04/29/2016  . Pyelonephritis 04/17/2016  . Kidney stone 04/16/2016  . Hypokalemia 03/23/2016  . Bladder pain 10/15/2014  . Chronic cystitis 10/15/2014  . Gross hematuria 09/17/2014  . Renal colic 09/17/2014  . Headache 10/17/2012  . Pulmonary emphysema (HCC) 10/17/2012  . Chronic pain 05/31/2012  . Breast mass 05/22/2012  . Urinary urgency 03/08/2012    Past Surgical History:  Procedure Laterality Date  .  ABDOMINAL HYSTERECTOMY    . BREAST SURGERY Bilateral 2016   breast implants  . CLAVICLE SURGERY    . CYSTOSCOPY W/ URETERAL STENT PLACEMENT Right 05/11/2016   Procedure: CYSTOSCOPY WITH STENT REPLACEMENT;  Surgeon: Hildred LaserBrian James Budzyn, MD;  Location: [facility] ORS;  Service: Urology;  Laterality: Right;  . CYSTOSCOPY WITH STENT PLACEMENT N/A 04/16/2016   Procedure: CYSTOSCOPY WITH STENT PLACEMENT and retrograde pyelogram;  Surgeon: Zonia Kiefaniel Wollin, MD;  Location: [facility] ORS;  Service: Urology;  Laterality: N/A;  . FACIAL FRACTURE SURGERY     baseball accident- pins placed  . FEMUR FRACTURE SURGERY    . FRACTURE SURGERY  2001   face  . IR GENERIC HISTORICAL  07/29/2016   IR NEPHROSTOMY PLACEMENT LEFT 07/29/2016 [facility]-INTERV RAD  . NEPHROLITHOTOMY Left 07/29/2016   Procedure: NEPHROLITHOTOMY PERCUTANEOUS;  Surgeon: Hildred LaserBrian James Budzyn, MD;  Location: [facility] ORS;  Service: Urology;  Laterality: Left;  . RIB FRACTURE SURGERY  08/2015   pins and screws  . URETEROSCOPY WITH HOLMIUM LASER LITHOTRIPSY Right 05/11/2016   Procedure: URETEROSCOPY WITH HOLMIUM LASER LITHOTRIPSY;  Surgeon: Hildred LaserBrian James Budzyn, MD;  Location: [facility];  Service: Urology;  Laterality: Right;    Prior to Admission medications   Medication Sig Start Date End Date Taking? Authorizing Provider  acetaminophen (TYLENOL) 500 MG tablet Take 2,000 mg by mouth every 6 (six) hours as needed for moderate pain (patient takes 4 tablets at a time).     [provider]  estrogens, conjugated, (PREMARIN) 0.45 MG tablet Take 0.45 mg  by mouth daily.     [provider]  oxyCODONE (OXY IR/ROXICODONE) 5 MG immediate release tablet Take 2 tablets (10 mg total) by mouth 4 (four) times daily as needed for severe pain. Patient not taking: Reported on 08/12/2016 07/29/16   Hildred Laser, MD  potassium chloride (K-DUR) 10 MEQ tablet Take 1 tablet (10 mEq total) daily by mouth. 05/17/17   Emily Filbert, MD  sulfamethoxazole-trimethoprim  (BACTRIM DS) 800-160 MG tablet Take 1 tablet 2 (two) times daily by mouth. 05/17/17   Emily Filbert, MD    Allergies Meropenem; Penicillins; Doxycycline; Motrin [ibuprofen]; Tape; Toradol [ketorolac tromethamine]; Tramadol; Amoxicillin; Ketorolac; Methadone; and Naproxen sodium  Family History  Problem Relation Age of Onset  . Kidney disease Mother   . Pancreatic cancer Father   . Bladder Cancer Neg Hx   . Kidney cancer Neg Hx   . Prostate cancer Neg Hx     Social History Social History   Tobacco Use  . Smoking status: Current Every Day Smoker    Packs/day: 0.25    Types: Cigarettes  . Smokeless tobacco: Never Used  Substance Use Topics  . Alcohol use: No  . Drug use: No    Review of Systems Constitutional: No fever/chills Eyes: No visual changes. ENT: No sore throat. Cardiovascular: Positive for right lower chest pain.  Respiratory: Denies shortness of breath. Gastrointestinal: Positive for right upper quadrant pain.  Genitourinary: Negative for dysuria. Musculoskeletal: Negative for back pain. Skin: Negative for rash. Neurological: Negative for headaches, focal weakness or numbness.  ____________________________________________   PHYSICAL EXAM:  VITAL SIGNS: ED Triage Vitals  Enc Vitals Group     BP 08/25/18 1922 (!) 145/97     Pulse Rate 08/25/18 1920 (!) 119     Resp 08/25/18 1920 20     Temp 08/25/18 1920 98 F (36.7 C)     Temp Source 08/25/18 1920 Oral     SpO2 08/25/18 1920 99 %     Weight --      Height --      Head Circumference --      Peak Flow --      Pain Score 08/25/18 2022 10   Constitutional: Alert and oriented.  Eyes: Conjunctivae are normal.  ENT      Head: Normocephalic and atraumatic.      Nose: No congestion/rhinnorhea.      Mouth/Throat: Mucous membranes are moist.      Neck: No stridor. Hematological/Lymphatic/Immunilogical: No cervical lymphadenopathy. Cardiovascular: Normal rate, regular rhythm.  No murmurs, rubs, or  gallops. Respiratory: Normal respiratory effort without tachypnea nor retractions. Breath sounds are clear and equal bilaterally. No wheezes/rales/rhonchi. Gastrointestinal: Soft. Tender to palpation in the right upper quadrant.  Genitourinary: Deferred Musculoskeletal: Normal range of motion in all extremities. No lower extremity edema. Neurologic:  Normal speech and language. No gross focal neurologic deficits are appreciated.  Skin:  Skin is warm, dry and intact. No rash noted. Psychiatric: Mood and affect are normal. Speech and behavior are normal. Patient exhibits appropriate insight and judgment.  ____________________________________________    LABS (pertinent positives/negatives)  D-dimer 461.92 Trop <0.03 CBC wbc 9.4, hgb 14.7, plt 294 BMP wnl except k 3.4, glu 212  ____________________________________________   EKG  I, Phineas Semen, attending physician, personally viewed and interpreted this EKG  EKG Time: 1928 Rate: 113 Rhythm: sinus tachycardia Axis: normal Intervals: qtc 458 QRS: narrow ST changes: no st elevation Impression: abnormal ekg  ____________________________________________    RADIOLOGY  CXR Coarse lung markings  ____________________________________________   PROCEDURES  Procedures  ____________________________________________   INITIAL IMPRESSION / ASSESSMENT AND PLAN / ED COURSE  Pertinent labs & imaging results that were available during my care of the patient were reviewed by me and considered in my medical decision making (see chart for details).   Patient presented to the emergency department today because of concerns for right upper quadrant/right lower chest pain.  Differential would include gallbladder disease, ulcer disease, pneumonia, pneumothorax, shingles amongst other etiologies.  Patient's work-up here without any obvious etiology of the pain.  Patient had both right upper quadrant ultrasound as well as CT Angie of the  chest performed neither of which with concerning etiologies.  At this point I did discuss the possibility of herpes with the patient.  Discussed that we can prescribe antivirals in case she starts having rash.  Discussed with patient portance of following up with primary care. ____________________________________________   FINAL CLINICAL IMPRESSION(S) / ED DIAGNOSES  Final diagnoses:  RUQ pain     Note: This dictation was prepared with Dragon dictation. Any transcriptional errors that result from this process are unintentional     Phineas Semen, MD 08/26/18 1546

## 2018-08-26 MED ORDER — GABAPENTIN 300 MG PO CAPS: 300.0000 mg | ORAL_CAPSULE | Freq: Three times a day (TID) | ORAL | 0 refills | Status: AC

## 2018-08-26 MED ORDER — VALACYCLOVIR HCL 1 G PO TABS: 1000.0000 mg | ORAL_TABLET | Freq: Three times a day (TID) | ORAL | 0 refills | Status: AC

## 2018-08-26 MED ORDER — FENTANYL CITRATE (PF) 100 MCG/2ML IJ SOLN
50.0000 ug | Freq: Once | INTRAMUSCULAR | Status: AC
  Administered 2018-08-26: 50 ug via INTRAVENOUS
  Filled 2018-08-26: qty 2

## 2018-08-26 NOTE — ED Notes (Signed)
Pt signed hard copy of E-signature. Placed in pt chart 

## 2018-08-26 NOTE — Discharge Instructions (Signed)
Please seek medical attention for any high fevers, chest pain, shortness of breath, change in behavior, persistent vomiting, bloody stool or any other new or concerning symptoms.  

## 2018-12-04 ENCOUNTER — Ambulatory Visit: Payer: Self-pay | Admitting: Urology

## 2018-12-17 ENCOUNTER — Ambulatory Visit: Payer: Self-pay | Admitting: Urology

## 2018-12-19 ENCOUNTER — Ambulatory Visit: Payer: Self-pay | Admitting: Urology

## 2019-01-25 ENCOUNTER — Ambulatory Visit: Payer: Self-pay | Admitting: Urology

## 2019-10-12 ENCOUNTER — Telehealth: Payer: Self-pay | Admitting: Emergency Medicine

## 2019-10-12 ENCOUNTER — Encounter: Payer: Self-pay | Admitting: Emergency Medicine

## 2019-10-12 ENCOUNTER — Emergency Department
Admission: EM | Admit: 2019-10-12 | Discharge: 2019-10-12 | Disposition: A | Discharge status: Home / Self Care | Payer: Medicaid Other | Attending: Emergency Medicine | Admitting: Emergency Medicine

## 2019-10-12 ENCOUNTER — Other Ambulatory Visit: Payer: Self-pay

## 2019-10-12 ENCOUNTER — Emergency Department: Payer: Medicaid Other

## 2019-10-12 DIAGNOSIS — Z79899 Other long term (current) drug therapy: Secondary | ICD-10-CM | POA: Diagnosis not present

## 2019-10-12 DIAGNOSIS — R109 Unspecified abdominal pain: Secondary | ICD-10-CM | POA: Diagnosis present

## 2019-10-12 DIAGNOSIS — F17210 Nicotine dependence, cigarettes, uncomplicated: Secondary | ICD-10-CM | POA: Diagnosis not present

## 2019-10-12 DIAGNOSIS — N200 Calculus of kidney: Secondary | ICD-10-CM | POA: Diagnosis not present

## 2019-10-12 LAB — COMPREHENSIVE METABOLIC PANEL
ALT: 29 U/L (ref 0–44)
AST: 40 U/L (ref 15–41)
Albumin: 4.4 g/dL (ref 3.5–5.0)
Alkaline Phosphatase: 83 U/L (ref 38–126)
Anion gap: 12 (ref 5–15)
BUN: 15 mg/dL (ref 6–20)
CO2: 25 mmol/L (ref 22–32)
Calcium: 9.3 mg/dL (ref 8.9–10.3)
Chloride: 102 mmol/L (ref 98–111)
Creatinine, Ser: 0.88 mg/dL (ref 0.44–1.00)
GFR calc Af Amer: >60 mL/min (ref >60)
GFR calc non Af Amer: >60 mL/min (ref >60)
Glucose, Bld: 129 mg/dL — ABNORMAL HIGH (ref 70–99)
Potassium: 3.7 mmol/L (ref 3.5–5.1)
Sodium: 139 mmol/L (ref 135–145)
Total Bilirubin: 0.8 mg/dL (ref 0.3–1.2)
Total Protein: 7.7 g/dL (ref 6.5–8.1)

## 2019-10-12 LAB — CBC WITH DIFFERENTIAL/PLATELET
Abs Immature Granulocytes: 0.02 10*3/uL (ref 0.00–0.07)
Basophils Absolute: 0.1 10*3/uL (ref 0.0–0.1)
Basophils Relative: 1 %
Eosinophils Absolute: 0.1 10*3/uL (ref 0.0–0.5)
Eosinophils Relative: 1 %
HCT: 43.7 % (ref 36.0–46.0)
Hemoglobin: 14.4 g/dL (ref 12.0–15.0)
Immature Granulocytes: 0 %
Lymphocytes Relative: 8 %
Lymphs Abs: 0.8 10*3/uL (ref 0.7–4.0)
MCH: 30.8 pg (ref 26.0–34.0)
MCHC: 33 g/dL (ref 30.0–36.0)
MCV: 93.4 fL (ref 80.0–100.0)
Monocytes Absolute: 0.1 10*3/uL (ref 0.1–1.0)
Monocytes Relative: 1 %
Neutro Abs: 8.6 10*3/uL — ABNORMAL HIGH (ref 1.7–7.7)
Neutrophils Relative %: 89 %
Platelets: 254 10*3/uL (ref 150–400)
RBC: 4.68 MIL/uL (ref 3.87–5.11)
RDW: 13.2 % (ref 11.5–15.5)
WBC: 9.6 10*3/uL (ref 4.0–10.5)
nRBC: 0 % (ref 0.0–0.2)

## 2019-10-12 LAB — URINALYSIS, COMPLETE (UACMP) WITH MICROSCOPIC
Bilirubin Urine: NEGATIVE
Glucose, UA: NEGATIVE mg/dL
Hgb urine dipstick: NEGATIVE
Ketones, ur: NEGATIVE mg/dL
Leukocytes,Ua: NEGATIVE
Nitrite: NEGATIVE
Protein, ur: NEGATIVE mg/dL
Specific Gravity, Urine: 1.015 (ref 1.005–1.030)
pH: 6 (ref 5.0–8.0)

## 2019-10-12 LAB — LIPASE, BLOOD: Lipase: 24 U/L (ref 11–51)

## 2019-10-12 LAB — PREGNANCY, URINE: Preg Test, Ur: NEGATIVE

## 2019-10-12 MED ORDER — PROMETHAZINE HCL 25 MG/ML IJ SOLN
25.0000 mg | Freq: Once | INTRAMUSCULAR | Status: AC
  Administered 2019-10-12: 25 mg via INTRAVENOUS
  Filled 2019-10-12: qty 1

## 2019-10-12 MED ORDER — LIDOCAINE IN D5W 4-5 MG/ML-% IV SOLN
1.0000 mg/min | INTRAVENOUS | Status: DC
  Administered 2019-10-12: 1 mg/min via INTRAVENOUS
  Filled 2019-10-12: qty 500

## 2019-10-12 MED ORDER — HYDROMORPHONE HCL 1 MG/ML IJ SOLN
1.0000 mg | Freq: Once | INTRAMUSCULAR | Status: AC
  Administered 2019-10-12: 1 mg via INTRAVENOUS
  Filled 2019-10-12: qty 1

## 2019-10-12 MED ORDER — OXYCODONE-ACETAMINOPHEN 5-325 MG PO TABS: 1.0000 | ORAL_TABLET | Freq: Four times a day (QID) | ORAL | 0 refills | Status: DC | PRN

## 2019-10-12 MED ORDER — FENTANYL CITRATE (PF) 100 MCG/2ML IJ SOLN
50.0000 ug | Freq: Once | INTRAMUSCULAR | Status: AC
  Administered 2019-10-12: 18:00:00 50 ug via INTRAVENOUS
  Filled 2019-10-12: qty 2

## 2019-10-12 MED ORDER — ONDANSETRON 4 MG PO TBDP
8.0000 mg | ORAL_TABLET | Freq: Once | ORAL | Status: AC
  Administered 2019-10-12: 8 mg via ORAL
  Filled 2019-10-12: qty 2

## 2019-10-12 MED ORDER — FENTANYL CITRATE (PF) 100 MCG/2ML IJ SOLN
50.0000 ug | Freq: Once | INTRAMUSCULAR | Status: AC
  Administered 2019-10-12: 50 ug via INTRAVENOUS
  Filled 2019-10-12: qty 2

## 2019-10-12 MED ORDER — PROMETHAZINE HCL 25 MG PO TABS: 25.0000 mg | ORAL_TABLET | Freq: Four times a day (QID) | ORAL | 0 refills | Status: DC | PRN

## 2019-10-12 NOTE — ED Provider Notes (Signed)
[facility] Emergency Department Provider Note  ____________________________________________  Time seen: Approximately 2:07 PM  I have reviewed the triage vital signs and the nursing notes.   HISTORY  Chief Complaint Flank Pain    HPI Teresa Davenport is a 54 y.o. female who presents the emergency department complaining of sharp flank pain, hematuria.  Patient states that she has a history of kidney stones.  She has had multiple surgeries including stents, lithotripsy, surgical removal of stones to both kidneys, most often on the left.  Patient states that she was diagnosed with a large roughly 2 cm stone in the left kidney approximately a year ago.  At the time her mother was very ill with cancer, she could not take the time for any surgical intervention.  She states that she has been relatively stable until this morning when she is experiencing significant pain to the left flank region.  She states that this radiates into the left groin.  She is having hematuria.  She states that the pain is causing her to be nauseated with emesis.  She denies any fevers or chills, URI symptoms.  Patient denies any chest pain, diarrhea or constipation.  No dysuria, just hematuria.  Medical history as described below.         Past Medical History:  Diagnosis Date  . Breast mass 05/22/2012  . Chronic cystitis 10/15/2014  . Chronic pain 05/31/2012  . Complication of anesthesia    hard for me to go under   . Gross hematuria 09/17/2014  . History of kidney stones   . Hydronephrosis with urinary obstruction due to ureteral calculus 04/29/2016  . Hypokalemia 03/23/2016  . Kidney stone   . Meningitis 2013  . Pneumonia 2014  . Pyelonephritis 04/17/2016  . Renal colic 09/17/2014  . Renal disorder   . Rib fracture   . Traumatic brain injury (HCC) 2014  . Ureterolithiasis 04/29/2016    Patient Active Problem List   Diagnosis Date Noted  . Nephrolithiasis 07/29/2016  .  Hydronephrosis with urinary obstruction due to ureteral calculus 04/29/2016  . Ureterolithiasis 04/29/2016  . Pyelonephritis 04/17/2016  . Kidney stone 04/16/2016  . Hypokalemia 03/23/2016  . Bladder pain 10/15/2014  . Chronic cystitis 10/15/2014  . Gross hematuria 09/17/2014  . Renal colic 09/17/2014  . Headache 10/17/2012  . Pulmonary emphysema (HCC) 10/17/2012  . Chronic pain 05/31/2012  . Breast mass 05/22/2012  . Urinary urgency 03/08/2012    Past Surgical History:  Procedure Laterality Date  . ABDOMINAL HYSTERECTOMY    . BREAST SURGERY Bilateral 2016   breast implants  . CLAVICLE SURGERY    . CYSTOSCOPY W/ URETERAL STENT PLACEMENT Right 05/11/2016   Procedure: CYSTOSCOPY WITH STENT REPLACEMENT;  Surgeon: Hildred LaserBrian James Budzyn, MD;  Location: [facility] OR[facility];  Service: Urology;  Laterality: Right;  . CYSTOSCOPY WITH STENT PLACEMENT N/A 04/16/2016   Procedure: CYSTOSCOPY WITH STENT PLACEMENT and retrograde pyelogram;  Surgeon: Zonia Kiefaniel Wollin, MD;  Location: [facility] OR[facility];  Service: Urology;  Laterality: N/A;  . FACIAL FRACTURE SURGERY     baseball accident- pins placed  . FEMUR FRACTURE SURGERY    . FRACTURE SURGERY  2001   face  . IR GENERIC HISTORICAL  07/29/2016   IR NEPHROSTOMY PLACEMENT LEFT 07/29/2016 [facility]-INTERV RAD  . NEPHROLITHOTOMY Left 07/29/2016   Procedure: NEPHROLITHOTOMY PERCUTANEOUS;  Surgeon: Hildred LaserBrian James Budzyn, MD;  Location: [facility] OR[facility];  Service: Urology;  Laterality: Left;  . RIB FRACTURE SURGERY  08/2015   pins and screws  .  URETEROSCOPY WITH HOLMIUM LASER LITHOTRIPSY Right 05/11/2016   Procedure: URETEROSCOPY WITH HOLMIUM LASER LITHOTRIPSY;  Surgeon: Hildred Laser, MD;  Location: [facility] OR[facility];  Service: Urology;  Laterality: Right;    Prior to Admission medications   Medication Sig Start Date End Date Taking? Authorizing Provider  acetaminophen (TYLENOL) 500 MG tablet Take 2,000 mg by mouth every 6 (six) hours as needed for moderate pain (patient takes 4 tablets at a  time).     [provider]  estrogens, conjugated, (PREMARIN) 0.45 MG tablet Take 0.45 mg by mouth daily.     [provider]  gabapentin (NEURONTIN) 300 MG capsule Take 1 capsule (300 mg total) by mouth 3 (three) times daily for 10 days. 08/26/18 09/05/18  Phineas Semen, MD  oxyCODONE (OXY IR/ROXICODONE) 5 MG immediate release tablet Take 2 tablets (10 mg total) by mouth 4 (four) times daily as needed for severe pain. Patient not taking: Reported on 08/12/2016 07/29/16   Hildred Laser, MD  oxyCODONE-acetaminophen (PERCOCET/ROXICET) 5-325 MG tablet Take 1 tablet by mouth every 6 (six) hours as needed for severe pain. 10/12/19   Marlen Koman, Delorise Royals, PA-C  potassium chloride (K-DUR) 10 MEQ tablet Take 1 tablet (10 mEq total) daily by mouth. 05/17/17   Emily Filbert, MD  promethazine (PHENERGAN) 25 MG tablet Take 1 tablet (25 mg total) by mouth every 6 (six) hours as needed for nausea or vomiting. 10/12/19   Kostantinos Tallman, Delorise Royals, PA-C  sulfamethoxazole-trimethoprim (BACTRIM DS) 800-160 MG tablet Take 1 tablet 2 (two) times daily by mouth. 05/17/17   Emily Filbert, MD    Allergies Meropenem, Penicillins, Doxycycline, Motrin [ibuprofen], Tape, Toradol [ketorolac tromethamine], Tramadol, Amoxicillin, Ketorolac, Methadone, and Naproxen sodium  Family History  Problem Relation Age of Onset  . Kidney disease Mother   . Pancreatic cancer Father   . Bladder Cancer Neg Hx   . Kidney cancer Neg Hx   . Prostate cancer Neg Hx     Social History Social History   Tobacco Use  . Smoking status: Current Every Day Smoker    Packs/day: 0.25    Types: Cigarettes  . Smokeless tobacco: Never Used  Substance Use Topics  . Alcohol use: No  . Drug use: No     Review of Systems  Constitutional: No fever/chills Eyes: No visual changes. No discharge ENT: No upper respiratory complaints. Cardiovascular: no chest pain. Respiratory: no cough. No SOB. Gastrointestinal: No  abdominal pain.  No nausea, no vomiting.  No diarrhea.  No constipation. Genitourinary: Negative for dysuria.  Significant left flank pain with known kidney stone.  Positive for hematuria. Musculoskeletal: Negative for musculoskeletal pain. Skin: Negative for rash, abrasions, lacerations, ecchymosis. Neurological: Negative for headaches, focal weakness or numbness. 10-point ROS otherwise negative.  ____________________________________________   PHYSICAL EXAM:  VITAL SIGNS: ED Triage Vitals [10/12/19 1345]  Enc Vitals Group     BP      Pulse      Resp      Temp      Temp src      SpO2      Weight 140 lb (63.5 kg)     Height 5\' 2"  (1.575 m)     Head Circumference      Peak Flow      Pain Score 10     Pain Loc      Pain Edu?      Excl. in GC?      Constitutional: Alert and oriented. Well appearing and  in no acute distress. Eyes: Conjunctivae are normal. PERRL. EOMI. Head: Atraumatic. ENT:      Ears:       Nose: No congestion/rhinnorhea.      Mouth/Throat: Mucous membranes are moist.  Neck: No stridor.    Cardiovascular: Normal rate, regular rhythm. Normal S1 and S2.  Good peripheral circulation. Respiratory: Normal respiratory effort without tachypnea or retractions. Lungs CTAB. Good air entry to the bases with no decreased or absent breath sounds. Gastrointestinal: No visible external abdominal wall findings.  Bowel sounds 4 quadrants. Soft to palpation all quadrants.  Patient has no tenderness over the quadrants but does have some tenderness over the left lateral abdominal wall extending into the left groin region.  No palpable abnormalities.  Some guarding along this distribution.. No palpable masses. No distention.  Examination of the left leg reveals surgical scars consistent with patient's history.  Positive for significant left CVA tenderness.  No right-sided CVA tenderness. Musculoskeletal: Full range of motion to all extremities. No gross deformities  appreciated. Neurologic:  Normal speech and language. No gross focal neurologic deficits are appreciated.  Skin:  Skin is warm, dry and intact. No rash noted. Psychiatric: Mood and affect are normal. Speech and behavior are normal. Patient exhibits appropriate insight and judgement.   ____________________________________________   LABS (all labs ordered are listed, but only abnormal results are displayed)  Labs Reviewed  COMPREHENSIVE METABOLIC PANEL - Abnormal; Notable for the following components:      Result Value   Glucose, Bld 129 (*)    All other components within normal limits  CBC WITH DIFFERENTIAL/PLATELET - Abnormal; Notable for the following components:   Neutro Abs 8.6 (*)    All other components within normal limits  URINALYSIS, COMPLETE (UACMP) WITH MICROSCOPIC - Abnormal; Notable for the following components:   Color, Urine YELLOW (*)    APPearance CLEAR (*)    Bacteria, UA RARE (*)    All other components within normal limits  LIPASE, BLOOD  PREGNANCY, URINE   ____________________________________________  EKG   ____________________________________________  RADIOLOGY I personally viewed and evaluated these images as part of my medical decision making, as well as reviewing the written report by the radiologist.  CT Renal Stone Study  Result Date: 10/12/2019 CLINICAL DATA:  Left flank pain, kidney stones suspected, history of kidney stones EXAM: CT ABDOMEN AND PELVIS WITHOUT CONTRAST TECHNIQUE: Multidetector CT imaging of the abdomen and pelvis was performed following the standard protocol without IV contrast. COMPARISON:  07/31/2016 FINDINGS: Lower chest: No acute abnormality. Hepatobiliary: No solid liver abnormality is seen. No gallstones, gallbladder wall thickening, or biliary dilatation. Pancreas: Unremarkable. No pancreatic ductal dilatation or surrounding inflammatory changes. Spleen: Normal in size without significant abnormality. Adrenals/Urinary Tract:  Adrenal glands are unremarkable. There is a 5 mm calculus in the distal third of the left ureter with severe left hydronephrosis and hydroureter. There are additional nonobstructive calculi in the kidneys bilaterally, with a large calculus in the inferior pole of the left kidney measuring 1.6 cm. Air in the urinary bladder. Stomach/Bowel: Stomach is within normal limits. Appendix appears normal. No evidence of bowel wall thickening, distention, or inflammatory changes. Vascular/Lymphatic: Aortic atherosclerosis. No enlarged abdominal or pelvic lymph nodes. Reproductive: No mass or other significant abnormality. Other: No abdominal wall hernia or abnormality. No abdominopelvic ascites. Musculoskeletal: No acute or significant osseous findings. IMPRESSION: 1. There is a 5 mm calculus in the distal third of the left ureter with severe left hydronephrosis and hydroureter. 2. There are  additional nonobstructive calculi in the kidneys bilaterally, with a large calculus in the inferior pole of the left kidney measuring 1.6 cm. 3.  Air in the urinary bladder.  Correlate with catheterization. 4.  Aortic Atherosclerosis (ICD10-I70.0). Electronically Signed   By: Lauralyn Primes M.D.   On: 10/12/2019 14:57    ____________________________________________    PROCEDURES  Procedure(s) performed:    Procedures    Medications  lidocaine (cardiac) 2000 mg in dextrose 5% 500 mL (4mg /mL) IV infusion (0 mg/min Intravenous Stopped 10/12/19 1752)  HYDROmorphone (DILAUDID) injection 1 mg (1 mg Intravenous Given 10/12/19 1423)  ondansetron (ZOFRAN-ODT) disintegrating tablet 8 mg (8 mg Oral Given 10/12/19 1422)  fentaNYL (SUBLIMAZE) injection 50 mcg (50 mcg Intravenous Given 10/12/19 1600)  promethazine (PHENERGAN) injection 25 mg (25 mg Intravenous Given 10/12/19 1603)  fentaNYL (SUBLIMAZE) injection 50 mcg (50 mcg Intravenous Given 10/12/19 1752)     ____________________________________________   INITIAL IMPRESSION /  ASSESSMENT AND PLAN / ED COURSE  Pertinent labs & imaging results that were available during my care of the patient were reviewed by me and considered in my medical decision making (see chart for details).  Review of the [place] CSRS was performed in accordance of the [facility] prior to dispensing any controlled drugs.  Clinical Course as of Oct 12 1802  Sat Oct 12, 2019  1619 Patient with an obstructing kidney stone in the left ureter.  5 mm stone with another nonobstructing 1.2 cm stone in the lower pole.  I discussed the patient with urology.  They recommend attempting pain control here in the emergency department.  Pain is successfully managed patient may be discharged with lithotripsy in 4 days.  Patient pain is refractory, they recommend medicine admission with urology consult for possible stent placement.  Patient has received Dilaudid, fentanyl as well as Zofran and Phenergan.  Patient is not having an improvement of her symptoms.  Urology recommends Toradol and IV infusion of lidocaine.  Patient is unfortunately allergic to Toradol.  I discussed the dosing of lidocaine with pharmacy and they recommend 1 mg/min IV infusion.  Orders placed at this time.   [JC]    Clinical Course User Index [JC] Coreon Simkins, 1620, PA-C          Patient's diagnosis is consistent with nephrolithiasis.  Patient presented to emergency department complaining of severe left flank pain causing nausea and vomiting from the pain.  Patient has a history of kidney stones.  Has had lithotripsy, stent placement in the past.  Patient states that she has a retained stone which was visualized at 1.2 cm in the left kidney.  This is in the inferior pole.  Patient does have a 5 mm stone in the distal third ureter with hydroureter and severe hydronephrosis.  I discussed the patient with urology.  Unfortunately patient is unable to take Toradol but they recommended lidocaine infusion.  Patient's pain is much better managed after 2  doses of pain medication, antiemetics, lidocaine infusion.  Patient was given option to be admitted for pain control with follow-up with urology versus discharged with pain medication and antiemetics.  As pain was better controlled, patient was discharged with Percocet and Phenergan.  Follow-up with urology.  Return if patient's symptoms worsen.. Patient is given ED precautions to return to the ED for any worsening or new symptoms.     ____________________________________________  FINAL CLINICAL IMPRESSION(S) / ED DIAGNOSES  Final diagnoses:  Nephrolithiasis      NEW MEDICATIONS STARTED DURING THIS VISIT:  ED Discharge Orders         Ordered    oxyCODONE-acetaminophen (PERCOCET/ROXICET) 5-325 MG tablet  Every 6 hours PRN     10/12/19 1742    promethazine (PHENERGAN) 25 MG tablet  Every 6 hours PRN     10/12/19 1742              This chart was dictated using voice recognition software/Dragon. Despite best efforts to proofread, errors can occur which can change the meaning. Any change was purely unintentional.    Racheal Patches, PA-C 10/12/19 1804    Phineas Semen, MD 10/12/19 1929

## 2019-10-12 NOTE — ED Notes (Signed)
Pt was found ambulating around C-Pod pt states, "I need to find my momma." Redirected pt back to the room. Connected back to the cardiac monitor and informed that she needed to stay in the bed. Pt seem more comfortable than when she first arrived.

## 2019-10-12 NOTE — ED Notes (Signed)
Pt continuously taking off leads and BP cuff and SPO2 monitor.

## 2019-10-12 NOTE — ED Notes (Signed)
Pt ambulatory upon discharge. Asked pt to wait in the lobby as she called her mother for a ride. Walked pt to the discharge hallway. Offered pt a wheelchair but pt insisted on walking. Pt has a steady gait.

## 2019-10-12 NOTE — ED Notes (Signed)
Pt transported to CT ?

## 2019-10-12 NOTE — ED Triage Notes (Addendum)
Pt via EMS from home c/o L flank pain that started 2 hours ago. Pt took approx 500mg  of Tylenol. Pt also c/o of NV. Pt has a hx of kidney stones. 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 Pt seems uncomfortable at this time. A&Ox4

## 2019-10-12 NOTE — Telephone Encounter (Signed)
Pt ambulating around lobby at this time, pt insisting that her mother is here, pt giving this RN and screener last name Gregary Cromer and Mencer, this RN and screener looked at board and explained to patient multiple times that patient's mother is not on the board and there is not a patient listed under either name. Pt repeatedly insisting that she is going back to find her mother at this time, this RN explained to patient that she could not wander the halls looking for her mother's rooms and looking for her mother. This RN attempted to call patient's mother on phone number listed on file without success. Pt noted to sit in waiting area, noted to drop her phone charger and D/C papers, this RN offered to pick up for patient, however patient refused. Pt then began kicking personal belongings across the lobby to where she was sitting. Security made aware of situation with patient and that patient had been told she could not wander the halls looking for her mother.

## 2019-10-13 ENCOUNTER — Emergency Department: Payer: Medicaid Other | Admitting: Anesthesiology

## 2019-10-13 ENCOUNTER — Emergency Department: Payer: Medicaid Other

## 2019-10-13 ENCOUNTER — Other Ambulatory Visit: Payer: Self-pay

## 2019-10-13 ENCOUNTER — Inpatient Hospital Stay
Admission: EM | Admit: 2019-10-13 | Discharge: 2019-10-20 | DRG: 853 | Disposition: A | Discharge status: Home / Self Care | Payer: Medicaid Other | Attending: Family Medicine | Admitting: Family Medicine

## 2019-10-13 ENCOUNTER — Encounter
Admission: EM | Disposition: A | Discharge status: Home / Self Care | Payer: Self-pay | Source: Home / Self Care | Attending: Family Medicine

## 2019-10-13 DIAGNOSIS — Z886 Allergy status to analgesic agent status: Secondary | ICD-10-CM

## 2019-10-13 DIAGNOSIS — F17210 Nicotine dependence, cigarettes, uncomplicated: Secondary | ICD-10-CM | POA: Diagnosis present

## 2019-10-13 DIAGNOSIS — J432 Centrilobular emphysema: Secondary | ICD-10-CM | POA: Diagnosis present

## 2019-10-13 DIAGNOSIS — F419 Anxiety disorder, unspecified: Secondary | ICD-10-CM | POA: Diagnosis present

## 2019-10-13 DIAGNOSIS — F10231 Alcohol dependence with withdrawal delirium: Secondary | ICD-10-CM | POA: Diagnosis present

## 2019-10-13 DIAGNOSIS — I248 Other forms of acute ischemic heart disease: Secondary | ICD-10-CM | POA: Diagnosis present

## 2019-10-13 DIAGNOSIS — N201 Calculus of ureter: Secondary | ICD-10-CM | POA: Diagnosis present

## 2019-10-13 DIAGNOSIS — R0902 Hypoxemia: Secondary | ICD-10-CM | POA: Diagnosis not present

## 2019-10-13 DIAGNOSIS — Z91048 Other nonmedicinal substance allergy status: Secondary | ICD-10-CM

## 2019-10-13 DIAGNOSIS — R0609 Other forms of dyspnea: Secondary | ICD-10-CM

## 2019-10-13 DIAGNOSIS — G9341 Metabolic encephalopathy: Secondary | ICD-10-CM | POA: Diagnosis present

## 2019-10-13 DIAGNOSIS — Z885 Allergy status to narcotic agent status: Secondary | ICD-10-CM

## 2019-10-13 DIAGNOSIS — B9620 Unspecified Escherichia coli [E. coli] as the cause of diseases classified elsewhere: Secondary | ICD-10-CM | POA: Diagnosis present

## 2019-10-13 DIAGNOSIS — Z1623 Resistance to quinolones and fluoroquinolones: Secondary | ICD-10-CM | POA: Diagnosis present

## 2019-10-13 DIAGNOSIS — Z881 Allergy status to other antibiotic agents status: Secondary | ICD-10-CM

## 2019-10-13 DIAGNOSIS — A4151 Sepsis due to Escherichia coli [E. coli]: Principal | ICD-10-CM | POA: Diagnosis present

## 2019-10-13 DIAGNOSIS — Z20822 Contact with and (suspected) exposure to covid-19: Secondary | ICD-10-CM | POA: Diagnosis present

## 2019-10-13 DIAGNOSIS — N136 Pyonephrosis: Secondary | ICD-10-CM

## 2019-10-13 DIAGNOSIS — R000 Tachycardia, unspecified: Secondary | ICD-10-CM

## 2019-10-13 DIAGNOSIS — Z841 Family history of disorders of kidney and ureter: Secondary | ICD-10-CM

## 2019-10-13 DIAGNOSIS — N1330 Unspecified hydronephrosis: Secondary | ICD-10-CM

## 2019-10-13 DIAGNOSIS — Z800 Family history of malignant neoplasm of digestive organs: Secondary | ICD-10-CM

## 2019-10-13 DIAGNOSIS — N132 Hydronephrosis with renal and ureteral calculous obstruction: Secondary | ICD-10-CM | POA: Diagnosis not present

## 2019-10-13 DIAGNOSIS — R109 Unspecified abdominal pain: Secondary | ICD-10-CM

## 2019-10-13 DIAGNOSIS — R0602 Shortness of breath: Secondary | ICD-10-CM

## 2019-10-13 DIAGNOSIS — Z880 Allergy status to penicillin: Secondary | ICD-10-CM

## 2019-10-13 HISTORY — PX: CYSTOSCOPY WITH URETEROSCOPY, STONE BASKETRY AND STENT PLACEMENT: SHX6378

## 2019-10-13 LAB — RESPIRATORY PANEL BY RT PCR (FLU A&B, COVID)
Influenza A by PCR: NEGATIVE
Influenza B by PCR: NEGATIVE
SARS Coronavirus 2 by RT PCR: NEGATIVE

## 2019-10-13 SURGERY — CYSTOSCOPY, WITH CALCULUS MANIPULATION OR REMOVAL
Anesthesia: General | Site: Ureter | Laterality: Left

## 2019-10-13 MED ORDER — PHENYLEPHRINE HCL (PRESSORS) 10 MG/ML IV SOLN
INTRAVENOUS | Status: DC | PRN
  Administered 2019-10-13: 200 ug via INTRAVENOUS
  Administered 2019-10-13: 100 ug via INTRAVENOUS
  Administered 2019-10-13 (×2): 200 ug via INTRAVENOUS
  Administered 2019-10-13: 100 ug via INTRAVENOUS

## 2019-10-13 MED ORDER — PHENAZOPYRIDINE HCL 200 MG PO TABS
200.0000 mg | ORAL_TABLET | Freq: Three times a day (TID) | ORAL | Status: DC | PRN
  Filled 2019-10-13: qty 1

## 2019-10-13 MED ORDER — DIPHENHYDRAMINE HCL 50 MG/ML IJ SOLN
12.5000 mg | Freq: Four times a day (QID) | INTRAMUSCULAR | Status: DC | PRN
  Administered 2019-10-15: 25 mg via INTRAVENOUS
  Administered 2019-10-17: 12.5 mg via INTRAVENOUS
  Filled 2019-10-13 (×3): qty 1

## 2019-10-13 MED ORDER — BELLADONNA ALKALOIDS-OPIUM 16.2-60 MG RE SUPP
RECTAL | Status: DC | PRN
  Administered 2019-10-13: 1 via RECTAL

## 2019-10-13 MED ORDER — EPHEDRINE SULFATE 50 MG/ML IJ SOLN
INTRAMUSCULAR | Status: DC | PRN
  Administered 2019-10-13: 10 mg via INTRAVENOUS

## 2019-10-13 MED ORDER — BELLADONNA ALKALOIDS-OPIUM 16.2-60 MG RE SUPP
1.0000 | Freq: Four times a day (QID) | RECTAL | Status: DC | PRN
  Administered 2019-10-14: 1 via RECTAL
  Filled 2019-10-13 (×2): qty 1

## 2019-10-13 MED ORDER — ONDANSETRON HCL 4 MG/2ML IJ SOLN
INTRAMUSCULAR | Status: DC | PRN
  Administered 2019-10-13: 4 mg via INTRAVENOUS

## 2019-10-13 MED ORDER — DIPHENHYDRAMINE HCL 12.5 MG/5ML PO ELIX
12.5000 mg | ORAL_SOLUTION | Freq: Four times a day (QID) | ORAL | Status: DC | PRN
  Administered 2019-10-15 – 2019-10-16 (×2): 25 mg via ORAL
  Filled 2019-10-13 (×5): qty 10

## 2019-10-13 MED ORDER — HYDROMORPHONE HCL 1 MG/ML IJ SOLN
INTRAMUSCULAR | Status: AC
  Administered 2019-10-13: 13:00:00 1 mg via INTRAVENOUS
  Filled 2019-10-13: qty 1

## 2019-10-13 MED ORDER — SEVOFLURANE IN SOLN
RESPIRATORY_TRACT | Status: AC
  Filled 2019-10-13: qty 250

## 2019-10-13 MED ORDER — LACTATED RINGERS IV SOLN: INTRAVENOUS | Status: DC | PRN

## 2019-10-13 MED ORDER — OXYCODONE HCL 5 MG/5ML PO SOLN: 5.0000 mg | Freq: Once | ORAL | Status: DC | PRN

## 2019-10-13 MED ORDER — FENTANYL CITRATE (PF) 100 MCG/2ML IJ SOLN
INTRAMUSCULAR | Status: AC
  Filled 2019-10-13: qty 2

## 2019-10-13 MED ORDER — SODIUM CHLORIDE 0.9 % IV BOLUS
1000.0000 mL | Freq: Once | INTRAVENOUS | Status: AC
  Administered 2019-10-13: 06:00:00 1000 mL via INTRAVENOUS

## 2019-10-13 MED ORDER — PROPOFOL 10 MG/ML IV BOLUS
INTRAVENOUS | Status: DC | PRN
  Administered 2019-10-13: 120 mg via INTRAVENOUS

## 2019-10-13 MED ORDER — CIPROFLOXACIN HCL 500 MG PO TABS: 500.0000 mg | ORAL_TABLET | Freq: Two times a day (BID) | ORAL | 0 refills | Status: DC

## 2019-10-13 MED ORDER — DEXAMETHASONE SODIUM PHOSPHATE 10 MG/ML IJ SOLN
INTRAMUSCULAR | Status: DC | PRN
  Administered 2019-10-13: 10 mg via INTRAVENOUS

## 2019-10-13 MED ORDER — LIDOCAINE HCL (CARDIAC) PF 100 MG/5ML IV SOSY
1.5000 mg/kg | PREFILLED_SYRINGE | Freq: Once | INTRAVENOUS | Status: AC
  Administered 2019-10-13: 06:00:00 95.2 mg via INTRAVENOUS
  Filled 2019-10-13: qty 5

## 2019-10-13 MED ORDER — PHENAZOPYRIDINE HCL 200 MG PO TABS: 200.0000 mg | ORAL_TABLET | Freq: Three times a day (TID) | ORAL | 0 refills | Status: DC | PRN

## 2019-10-13 MED ORDER — ZOLPIDEM TARTRATE 5 MG PO TABS
5.0000 mg | ORAL_TABLET | Freq: Every evening | ORAL | Status: DC | PRN
  Filled 2019-10-13: qty 1

## 2019-10-13 MED ORDER — POTASSIUM CHLORIDE IN NACL 20-0.45 MEQ/L-% IV SOLN
INTRAVENOUS | Status: DC
  Filled 2019-10-13 (×6): qty 1000

## 2019-10-13 MED ORDER — CIPROFLOXACIN IN D5W 400 MG/200ML IV SOLN
INTRAVENOUS | Status: AC
  Filled 2019-10-13: qty 200

## 2019-10-13 MED ORDER — HYDROMORPHONE HCL 1 MG/ML IJ SOLN
0.2500 mg | INTRAMUSCULAR | Status: DC | PRN
  Administered 2019-10-13: 0.25 mg via INTRAVENOUS

## 2019-10-13 MED ORDER — SENNOSIDES-DOCUSATE SODIUM 8.6-50 MG PO TABS: 1.0000 | ORAL_TABLET | Freq: Every evening | ORAL | Status: DC | PRN

## 2019-10-13 MED ORDER — CIPROFLOXACIN IN D5W 400 MG/200ML IV SOLN
INTRAVENOUS | Status: DC | PRN
  Administered 2019-10-13: 400 mg via INTRAVENOUS

## 2019-10-13 MED ORDER — MIDAZOLAM HCL 2 MG/2ML IJ SOLN
INTRAMUSCULAR | Status: DC | PRN
  Administered 2019-10-13: 2 mg via INTRAVENOUS

## 2019-10-13 MED ORDER — BISACODYL 10 MG RE SUPP: 10.0000 mg | Freq: Every day | RECTAL | Status: DC | PRN

## 2019-10-13 MED ORDER — ACETAMINOPHEN 325 MG PO TABS
650.0000 mg | ORAL_TABLET | ORAL | Status: DC | PRN
  Administered 2019-10-13 – 2019-10-15 (×5): 650 mg via ORAL
  Filled 2019-10-13 (×5): qty 2

## 2019-10-13 MED ORDER — ONDANSETRON HCL 4 MG/2ML IJ SOLN
4.0000 mg | Freq: Once | INTRAMUSCULAR | Status: AC
  Administered 2019-10-13: 06:00:00 4 mg via INTRAVENOUS
  Filled 2019-10-13: qty 2

## 2019-10-13 MED ORDER — OXYCODONE-ACETAMINOPHEN 5-325 MG PO TABS: 1.0000 | ORAL_TABLET | Freq: Four times a day (QID) | ORAL | 0 refills | Status: DC | PRN

## 2019-10-13 MED ORDER — ACETAMINOPHEN 10 MG/ML IV SOLN
1000.0000 mg | Freq: Four times a day (QID) | INTRAVENOUS | Status: AC | PRN
  Administered 2019-10-13: 1000 mg via INTRAVENOUS
  Filled 2019-10-13: qty 100

## 2019-10-13 MED ORDER — HYDROMORPHONE HCL 1 MG/ML IJ SOLN
0.5000 mg | INTRAMUSCULAR | Status: DC | PRN
  Administered 2019-10-13 (×3): 1 mg via INTRAVENOUS
  Administered 2019-10-14: 0.5 mg via INTRAVENOUS
  Administered 2019-10-14 (×6): 1 mg via INTRAVENOUS
  Filled 2019-10-13 (×11): qty 1

## 2019-10-13 MED ORDER — CIPROFLOXACIN IN D5W 400 MG/200ML IV SOLN
400.0000 mg | Freq: Two times a day (BID) | INTRAVENOUS | Status: DC
  Administered 2019-10-13 – 2019-10-14 (×2): 400 mg via INTRAVENOUS
  Filled 2019-10-13 (×3): qty 200

## 2019-10-13 MED ORDER — HYDROMORPHONE HCL 1 MG/ML IJ SOLN
1.0000 mg | Freq: Once | INTRAMUSCULAR | Status: AC
  Administered 2019-10-13: 06:00:00 1 mg via INTRAVENOUS
  Filled 2019-10-13: qty 1

## 2019-10-13 MED ORDER — OXYCODONE HCL 5 MG PO TABS: 5.0000 mg | ORAL_TABLET | Freq: Once | ORAL | Status: DC | PRN

## 2019-10-13 MED ORDER — FLEET ENEMA 7-19 GM/118ML RE ENEM: 1.0000 | ENEMA | Freq: Once | RECTAL | Status: DC | PRN

## 2019-10-13 MED ORDER — ONDANSETRON HCL 4 MG/2ML IJ SOLN
4.0000 mg | INTRAMUSCULAR | Status: DC | PRN
  Administered 2019-10-13 – 2019-10-14 (×2): 4 mg via INTRAVENOUS
  Filled 2019-10-13 (×2): qty 2

## 2019-10-13 MED ORDER — FENTANYL CITRATE (PF) 100 MCG/2ML IJ SOLN
100.0000 ug | Freq: Once | INTRAMUSCULAR | Status: AC
  Administered 2019-10-13: 06:00:00 100 ug via INTRAVENOUS

## 2019-10-13 MED ORDER — LIDOCAINE HCL (CARDIAC) PF 100 MG/5ML IV SOSY
PREFILLED_SYRINGE | INTRAVENOUS | Status: DC | PRN
  Administered 2019-10-13: 80 mg via INTRAVENOUS

## 2019-10-13 MED ORDER — MIDAZOLAM HCL 2 MG/2ML IJ SOLN
INTRAMUSCULAR | Status: AC
  Filled 2019-10-13: qty 2

## 2019-10-13 MED ORDER — FENTANYL CITRATE (PF) 100 MCG/2ML IJ SOLN
INTRAMUSCULAR | Status: DC | PRN
  Administered 2019-10-13 (×2): 50 ug via INTRAVENOUS

## 2019-10-13 MED ORDER — OXYCODONE HCL 5 MG PO TABS
5.0000 mg | ORAL_TABLET | ORAL | Status: DC | PRN
  Administered 2019-10-13 – 2019-10-15 (×9): 5 mg via ORAL
  Filled 2019-10-13 (×9): qty 1

## 2019-10-13 MED ORDER — OXYBUTYNIN CHLORIDE 5 MG PO TABS
5.0000 mg | ORAL_TABLET | Freq: Three times a day (TID) | ORAL | Status: DC | PRN
  Administered 2019-10-14 (×2): 5 mg via ORAL
  Filled 2019-10-13 (×3): qty 1

## 2019-10-13 MED ORDER — FENTANYL CITRATE (PF) 100 MCG/2ML IJ SOLN
25.0000 ug | INTRAMUSCULAR | Status: DC | PRN
  Administered 2019-10-13 (×2): 50 ug via INTRAVENOUS

## 2019-10-13 SURGICAL SUPPLY — 20 items
BAG DRAIN CYSTO-URO LG1000N (MISCELLANEOUS) ×3 IMPLANT
CATH URETL 5X70 OPEN END (CATHETERS) ×3 IMPLANT
CNTNR SPEC 2.5X3XGRAD LEK (MISCELLANEOUS) ×1
CONT SPEC 4OZ STER OR WHT (MISCELLANEOUS) ×2
CONTAINER SPEC 2.5X3XGRAD LEK (MISCELLANEOUS) ×1 IMPLANT
GLOVE BIO SURGEON STRL SZ8 (GLOVE) ×3 IMPLANT
GOWN STRL REUS W/ TWL LRG LVL4 (GOWN DISPOSABLE) ×1 IMPLANT
GOWN STRL REUS W/ TWL XL LVL3 (GOWN DISPOSABLE) ×1 IMPLANT
GOWN STRL REUS W/TWL LRG LVL4 (GOWN DISPOSABLE) ×2
GOWN STRL REUS W/TWL XL LVL3 (GOWN DISPOSABLE) ×2
GUIDEWIRE STR DUAL SENSOR (WIRE) ×6 IMPLANT
KIT TURNOVER CYSTO (KITS) ×3 IMPLANT
PACK CYSTO AR (MISCELLANEOUS) ×3 IMPLANT
SET CYSTO W/LG BORE CLAMP LF (SET/KITS/TRAYS/PACK) ×3 IMPLANT
SHEATH URETERAL 12FRX35CM (MISCELLANEOUS) ×3 IMPLANT
SOL .9 NS 3000ML IRR  AL (IV SOLUTION) ×2
SOL .9 NS 3000ML IRR UROMATIC (IV SOLUTION) ×1 IMPLANT
STENT URET 6FRX24 CONTOUR (STENTS) ×3 IMPLANT
STENT URET 6FRX26 CONTOUR (STENTS) IMPLANT
WATER STERILE IRR 1000ML POUR (IV SOLUTION) ×3 IMPLANT

## 2019-10-13 NOTE — Transfer of Care (Signed)
Immediate Anesthesia Transfer of Care Note  Patient: Teresa Davenport  Procedure(s) Performed: CYSTOSCOPY WITH URETEROSCOPY, STENT PLACEMENT (Left Ureter)  Patient Location: PACU  Anesthesia Type:General  Level of Consciousness: sedated  Airway & Oxygen Therapy: Patient Spontanous Breathing and Patient connected to face mask oxygen  Post-op Assessment: Report given to RN and Post -op Vital signs reviewed and stable  Post vital signs: Reviewed and stable  Last Vitals:  Vitals Value Taken Time  BP 93/41 10/13/19 0907  Temp 37.1 C 10/13/19 0906  Pulse 107 10/13/19 0910  Resp 16 10/13/19 0910  SpO2 100 % 10/13/19 0910  Vitals shown include unvalidated device data.  Last Pain:  Vitals:   10/13/19 0906  PainSc: Asleep         Complications: No apparent anesthesia complications

## 2019-10-13 NOTE — Anesthesia Postprocedure Evaluation (Signed)
Anesthesia Post Note  Patient: Teresa Davenport  Procedure(s) Performed: CYSTOSCOPY WITH URETEROSCOPY, STENT PLACEMENT (Left Ureter)  Patient location during evaluation: PACU Anesthesia Type: General Level of consciousness: awake and alert Pain management: pain level controlled Vital Signs Assessment: post-procedure vital signs reviewed and stable Respiratory status: spontaneous breathing, nonlabored ventilation, respiratory function stable and patient connected to nasal cannula oxygen Cardiovascular status: blood pressure returned to baseline and stable Postop Assessment: no apparent nausea or vomiting Anesthetic complications: no     Last Vitals:  Vitals:   10/13/19 0948 10/13/19 0950  BP: (!) 89/57 (!) 102/51  Pulse: (!) 118 (!) 105  Resp: (!) 21 17  Temp:    SpO2: 91% 98%    Last Pain:  Vitals:   10/13/19 0950  PainSc: 8                  Cleda Mccreedy Laporsha Grealish

## 2019-10-13 NOTE — Discharge Instructions (Signed)

## 2019-10-13 NOTE — ED Triage Notes (Signed)
Pt complains of severe left flank pain. Pt with recently diagnosed renal calculi.

## 2019-10-13 NOTE — H&P (Signed)
Subjective: 1. Ureteral stone      I was asked to see Teresa Davenport by Dr. Marisa Davenport for a 39mm left distal ureteral stone with hydronephrosis and intractable pain.   She began to have symptoms 2 days ago and was seen in the ED yesterday with pain control but bounced back to the ED this morning.   She has had no fever or chills.  CT shows the 74mm left distal stone and a 1.6cm LLP stone with marked hydronephrosis.   She has nausea and vomiting but no associated signs or symptoms.  She has recurrent stones with prior PNCL and URS.  The last here was in 2017.  ROS:  Review of Systems  Constitutional: Negative for chills and fever.  Gastrointestinal: Positive for nausea and vomiting.  Genitourinary: Positive for flank pain.  All other systems reviewed and are negative.   Allergies  Allergen Reactions  . Teresa Davenport Shortness Of Breath  . Penicillins Anaphylaxis and Swelling    Has patient had a PCN reaction causing immediate rash, facial/tongue/throat swelling, SOB or lightheadedness with hypotension: Yes Has patient had a PCN reaction causing severe rash involving mucus membranes or skin necrosis: No Has patient had a PCN reaction that required hospitalization No Has patient had a PCN reaction occurring within the last 10 years: No If all of the above answers are "NO", then may proceed with Cephalosporin use.  Marland Kitchen Doxycycline Swelling  . Motrin [Ibuprofen] Swelling  . Tape     Patient says she cannot tolerate any tape but tegaderm  . Toradol [Ketorolac Tromethamine] Swelling  . Tramadol Swelling  . Amoxicillin Rash  . Ketorolac Rash  . Methadone Rash  . Naproxen Sodium Rash    Past Medical History:  Diagnosis Date  . Breast mass 05/22/2012  . Chronic cystitis 10/15/2014  . Chronic pain 05/31/2012  . Complication of anesthesia    hard for me to go under   . Gross hematuria 09/17/2014  . History of kidney stones   . Hydronephrosis with urinary obstruction due to ureteral calculus  04/29/2016  . Hypokalemia 03/23/2016  . Kidney stone   . Meningitis 2013  . Pneumonia 2014  . Pyelonephritis 04/17/2016  . Renal colic 09/17/2014  . Renal disorder   . Rib fracture   . Traumatic brain injury (HCC) 2014  . Ureterolithiasis 04/29/2016    Past Surgical History:  Procedure Laterality Date  . ABDOMINAL HYSTERECTOMY    . BREAST SURGERY Bilateral 2016   breast implants  . CLAVICLE SURGERY    . CYSTOSCOPY W/ URETERAL STENT PLACEMENT Right 05/11/2016   Procedure: CYSTOSCOPY WITH STENT REPLACEMENT;  Surgeon: Teresa Laser, MD;  Location: [facility];  Service: Urology;  Laterality: Right;  . CYSTOSCOPY WITH STENT PLACEMENT N/A 04/16/2016   Procedure: CYSTOSCOPY WITH STENT PLACEMENT and retrograde pyelogram;  Surgeon: Teresa Kief, MD;  Location: [facility];  Service: Urology;  Laterality: N/A;  . FACIAL FRACTURE SURGERY     baseball accident- pins placed  . FEMUR FRACTURE SURGERY    . FRACTURE SURGERY  2001   face  . IR GENERIC HISTORICAL  07/29/2016   IR NEPHROSTOMY PLACEMENT LEFT 07/29/2016 [facility]  . NEPHROLITHOTOMY Left 07/29/2016   Procedure: NEPHROLITHOTOMY PERCUTANEOUS;  Surgeon: Teresa Laser, MD;  Location: [facility];  Service: Urology;  Laterality: Left;  . RIB FRACTURE SURGERY  08/2015   pins and screws  . URETEROSCOPY WITH HOLMIUM Davenport LITHOTRIPSY Right 05/11/2016   Procedure: URETEROSCOPY WITH HOLMIUM Davenport LITHOTRIPSY;  Surgeon:  Teresa Laser, MD;  Location: [facility];  Service: Urology;  Laterality: Right;    Social History   Socioeconomic History  . Marital status: Divorced    Spouse name: Not on file  . Number of children: Not on file  . Years of education: Not on file  . Highest education level: Not on file  Occupational History  . Not on file  Tobacco Use  . Smoking status: Current Every Day Smoker    Packs/day: 0.25    Types: Cigarettes  . Smokeless tobacco: Never Used  Substance and Sexual Activity  . Alcohol use: No  . Drug  use: No  . Sexual activity: Not on file  Other Topics Concern  . Not on file  Social History Narrative  . Not on file   Social Determinants of Health   Financial Resource Strain:   . Difficulty of Paying Living Expenses:   Food Insecurity:   . Worried About Teresa Davenport in the Last Year:   . Barista in the Last Year:   Transportation Needs:   . Freight forwarder (Medical):   Marland Kitchen Lack of Transportation (Non-Medical):   Physical Activity:   . Days of Exercise per Week:   . Minutes of Exercise per Session:   Stress:   . Feeling of Stress :   Social Connections:   . Frequency of Communication with Friends and Family:   . Frequency of Social Gatherings with Friends and Family:   . Attends Religious Services:   . Active Member of Clubs or Organizations:   . Attends Banker Meetings:   Marland Kitchen Marital Status:   Intimate Partner Violence:   . Fear of Current or Ex-Partner:   . Emotionally Abused:   Marland Kitchen Physically Abused:   . Sexually Abused:     Family History  Problem Relation Age of Onset  . Kidney disease Mother   . Pancreatic cancer Father   . Bladder Cancer Neg Hx   . Kidney cancer Neg Hx   . Prostate cancer Neg Hx     Anti-infectives: Anti-infectives (From admission, onward)   None      No current facility-administered medications for this encounter.   Current Outpatient Medications  Medication Sig Dispense Refill  . acetaminophen (TYLENOL) 500 MG tablet Take 2,000 mg by mouth every 6 (six) hours as needed for moderate pain (patient takes 4 tablets at a time).     . promethazine (PHENERGAN) 25 MG tablet Take 1 tablet (25 mg total) by mouth every 6 (six) hours as needed for nausea or vomiting. 30 tablet 0  . estrogens, conjugated, (PREMARIN) 0.45 MG tablet Take 0.45 mg by mouth daily.     Marland Kitchen gabapentin (NEURONTIN) 300 MG capsule Take 1 capsule (300 mg total) by mouth 3 (three) times daily for 10 days. 30 capsule 0  . potassium chloride  (K-DUR) 10 MEQ tablet Take 1 tablet (10 mEq total) daily by mouth. 7 tablet 0     Objective: Vital signs in last 24 hours: BP (!) 109/56   Pulse (!) 105   Temp 98.8 F (37.1 C)   Resp 19   Ht 5\' 2"  (1.575 m)   Wt 63 kg   SpO2 96%   BMI 25.40 kg/m   Intake/Output from previous day: No intake/output data recorded. Intake/Output this shift: No intake/output data recorded.   Physical Exam Vitals reviewed.  Constitutional:      Appearance: Normal appearance. She is ill-appearing.  Cardiovascular:     Rate and Rhythm: Regular rhythm. Tachycardia present.  Pulmonary:     Effort: Pulmonary effort is normal. No respiratory distress.  Abdominal:     General: Abdomen is flat.     Tenderness: There is left CVA tenderness.  Musculoskeletal:        General: No tenderness. Normal range of motion.  Skin:    General: Skin is warm and dry.  Neurological:     General: No focal deficit present.     Mental Status: She is alert and oriented to person, place, and time.  Psychiatric:     Comments: In obvious pain.      Lab Results:  Results for orders placed or performed during the hospital encounter of 10/13/19 (from the past 24 hour(s))  Respiratory Panel by RT PCR (Flu A&B, Covid) - Nasopharyngeal Swab     Status: None   Collection Time: 10/13/19  6:33 AM   Specimen: Nasopharyngeal Swab  Result Value Ref Range   SARS Coronavirus 2 by RT PCR NEGATIVE NEGATIVE   Influenza A by PCR NEGATIVE NEGATIVE   Influenza B by PCR NEGATIVE NEGATIVE    BMET Recent Labs    10/12/19 1408  NA 139  K 3.7  CL 102  CO2 25  GLUCOSE 129*  BUN 15  CREATININE 0.88  CALCIUM 9.3   PT/INR No results for input(s): LABPROT, INR in the last 72 hours. ABG No results for input(s): PHART, HCO3 in the last 72 hours.  Invalid input(s): PCO2, PO2  Studies/Results: CT Renal Stone Study  Result Date: 10/12/2019 CLINICAL DATA:  Left flank pain, kidney stones suspected, history of kidney stones EXAM:  CT ABDOMEN AND PELVIS WITHOUT CONTRAST TECHNIQUE: Multidetector CT imaging of the abdomen and pelvis was performed following the standard protocol without IV contrast. COMPARISON:  07/31/2016 FINDINGS: Lower chest: No acute abnormality. Hepatobiliary: No solid liver abnormality is seen. No gallstones, gallbladder wall thickening, or biliary dilatation. Pancreas: Unremarkable. No pancreatic ductal dilatation or surrounding inflammatory changes. Spleen: Normal in size without significant abnormality. Adrenals/Urinary Tract: Adrenal glands are unremarkable. There is a 5 mm calculus in the distal third of the left ureter with severe left hydronephrosis and hydroureter. There are additional nonobstructive calculi in the kidneys bilaterally, with a large calculus in the inferior pole of the left kidney measuring 1.6 cm. Air in the urinary bladder. Stomach/Bowel: Stomach is within normal limits. Appendix appears normal. No evidence of bowel wall thickening, distention, or inflammatory changes. Vascular/Lymphatic: Aortic atherosclerosis. No enlarged abdominal or pelvic lymph nodes. Reproductive: No mass or other significant abnormality. Other: No abdominal wall hernia or abnormality. No abdominopelvic ascites. Musculoskeletal: No acute or significant osseous findings. IMPRESSION: 1. There is a 5 mm calculus in the distal third of the left ureter with severe left hydronephrosis and hydroureter. 2. There are additional nonobstructive calculi in the kidneys bilaterally, with a large calculus in the inferior pole of the left kidney measuring 1.6 cm. 3.  Air in the urinary bladder.  Correlate with catheterization. 4.  Aortic Atherosclerosis (ICD10-I70.0). Electronically Signed   By: Eddie Candle M.D.   On: 10/12/2019 14:57     Assessment/Plan: 1.  69mm left distal ureteral stone with pain and obstruction.  I will take her for cystoscopy, left RTG, left URS with HLL and stenting.   Risks of bleeding, infection, ureteral  injury, need for a stent and secondary procedures, thrombotic events and anesthetic complications reviewed.  2. 1.6cm LLP stone.   She will  need this treated later with consideration of a left PCNL.          CC: Dr. Dionne Bucy     Bjorn Pippin 10/13/2019 [phone]

## 2019-10-13 NOTE — Op Note (Signed)
Procedure: 1.  Cystoscopy with left retrograde pyelogram and interpretation. 2.  Left ureteroscopy. 3.  Cystoscopy with insertion of left double-J stent. 4.  Application of fluoroscopy.  Preop diagnosis: Left distal ureteral stone with obstruction.  Postop diagnosis: Same with pyonephrosis.  Surgeon: Dr. Bjorn Pippin.  Anesthesia: General.  Specimen: Urine culture.  Drain: 6 [place] by 24 cm left contour double-J stent.  EBL: None.  Complications: None.  Indications: The patient is a 54 year old white female with a history of stones who presented to emergency room approximately 2 days ago with severe left flank pain.  Her pain was initially managed but she returned to the emergency room with recurrent severe pain that was difficult to manage with narcotics.  She had no fever, clear urine and a normal white count in the emergency room and it was felt that cystoscopy with left ureteroscopy and stone extraction was indicated.  She also has a 1.6 cm left lower pole stone that will need future treatment.  Procedure: She was taken operating room where general anesthetic was induced.  She was given 40 mg of Cipro IV.  She was placed in lithotomy position.  Her perineum and genitalia were prepped with Betadine solution and she was draped in usual sterile fashion.  Cystoscopy was performed using a 23 [place] scope and 30 degree lens.  Examination revealed a normal urethra.  The bladder wall was smooth and pale without tumor stones or inflammation.  Ureteral orifices were unremarkable.  The left ureteral orifice was cannulated with a 5 [place] open-ended catheter and contrast was instilled.  There was a normal caliber distal ureter with slight narrowing below a filling defect consistent with her stone which was in a moderately dilated ureter.  The stone itself appeared to be radiolucent on fluoroscopic imaging while the left lower pole stone was easily seen.  A sensor guidewire was advanced through  the open-ended catheter to the kidney without difficulty under fluoroscopic guidance.  The open-ended catheter was removed.  The urine that effluxed along the wire after relief of obstruction was moderately turbid.  However it seemed to clear.  A 35 cm digital access sheath 12 French inner core was then passed over the wire and easily went up the ureter by the stone.  The 6.5 French dual-lumen semirigid ureteroscope was then prepared and passed alongside the wire to the level of the stone.  In the ureter there appeared to be more flocculent debris and the stone itself had an appearance that was most consistent with struvite which increased the concern for an infection related process.  At this point I felt it was most appropriate to abandon ureteroscopy and place a stent.  The ureteroscope was removed and the cystoscope was reinserted over the wire.  A urine specimen was obtained for culture and then a 6 [place] by 24 cm contour double-J stent was then advanced to the kidney under fluoroscopic guidance.  The wire was then removed leaving good coil in the kidney and a good coil in the bladder.  Her bladder was drained and the cystoscope was removed.  She was taken down from lithotomy position, her anesthetic was reversed and she was moved recovery room in stable condition.  There were no complications but she will need subsequent procedures for removal of the distal stone and lower pole renal stone.

## 2019-10-13 NOTE — ED Provider Notes (Signed)
[facility] Emergency Department Provider Note ____________________________________________   First MD Initiated Contact with Patient 10/13/19 [phone]     (approximate)  I have reviewed the triage vital signs and the nursing notes.   HISTORY  Chief Complaint Flank Pain    HPI Teresa Davenport is a 54 y.o. female with PMH as noted below including extensive history of kidney stones status post multiple stents, lithotripsy, and surgical removal who presents with left flank pain since yesterday, persistent course, not relieved by medications at home, and associated with nausea and vomiting.  Patient was seen in the ED for this yesterday and states that the pain has been about the same since she left the hospital.  She denies any new symptoms.  Past Medical History:  Diagnosis Date  . Breast mass 05/22/2012  . Chronic cystitis 10/15/2014  . Chronic pain 05/31/2012  . Complication of anesthesia    hard for me to go under   . Gross hematuria 09/17/2014  . History of kidney stones   . Hydronephrosis with urinary obstruction due to ureteral calculus 04/29/2016  . Hypokalemia 03/23/2016  . Kidney stone   . Meningitis 2013  . Pneumonia 2014  . Pyelonephritis 04/17/2016  . Renal colic 08/17/348  . Renal disorder   . Rib fracture   . 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 Ureterolithiasis 04/29/2016    Patient Active Problem List   Diagnosis Date Noted  . Nephrolithiasis 07/29/2016  . Hydronephrosis with urinary obstruction due to ureteral calculus 04/29/2016  . Ureterolithiasis 04/29/2016  . Pyelonephritis 04/17/2016  . Kidney stone 04/16/2016  . Hypokalemia 03/23/2016  . Bladder pain 10/15/2014  . Chronic cystitis 10/15/2014  . Gross hematuria 09/17/2014  . Renal colic 09/38/1829  . Headache 10/17/2012  . Pulmonary emphysema ([place]) 10/17/2012  . Chronic pain 05/31/2012  . Breast mass 05/22/2012  . Urinary urgency 03/08/2012    Past Surgical History:    Procedure Laterality Date  . ABDOMINAL HYSTERECTOMY    . BREAST SURGERY Bilateral 2016   breast implants  . CLAVICLE SURGERY    . CYSTOSCOPY W/ URETERAL STENT PLACEMENT Right 05/11/2016   Procedure: CYSTOSCOPY WITH STENT REPLACEMENT;  Surgeon: Nickie Retort, MD;  Location: [facility] ORS;  Service: Urology;  Laterality: Right;  . CYSTOSCOPY WITH STENT PLACEMENT N/A 04/16/2016   Procedure: CYSTOSCOPY WITH STENT PLACEMENT and retrograde pyelogram;  Surgeon: Margarite Gouge, MD;  Location: [facility] ORS;  Service: Urology;  Laterality: N/A;  . FACIAL FRACTURE SURGERY     baseball accident- pins placed  . FEMUR FRACTURE SURGERY    . FRACTURE SURGERY  2001   face  . IR GENERIC HISTORICAL  07/29/2016   IR NEPHROSTOMY PLACEMENT LEFT 07/29/2016 [facility]-INTERV RAD  . NEPHROLITHOTOMY Left 07/29/2016   Procedure: NEPHROLITHOTOMY PERCUTANEOUS;  Surgeon: Nickie Retort, MD;  Location: [facility] OR[facility];  Service: Urology;  Laterality: Left;  . RIB FRACTURE SURGERY  08/2015   pins and screws  . URETEROSCOPY WITH HOLMIUM LASER LITHOTRIPSY Right 05/11/2016   Procedure: URETEROSCOPY WITH HOLMIUM LASER LITHOTRIPSY;  Surgeon: Nickie Retort, MD;  Location: [facility];  Service: Urology;  Laterality: Right;    Prior to Admission medications   Medication Sig Start Date End Date Taking? Authorizing Provider  acetaminophen (TYLENOL) 500 MG tablet Take 2,000 mg by mouth every 6 (six) hours as needed for moderate pain (patient takes 4 tablets at a time).     [provider]  estrogens, conjugated, (PREMARIN) 0.45 MG tablet Take  0.45 mg by mouth daily.     [provider]  gabapentin (NEURONTIN) 300 MG capsule Take 1 capsule (300 mg total) by mouth 3 (three) times daily for 10 days. 08/26/18 09/05/18  Phineas Semen, MD  oxyCODONE (OXY IR/ROXICODONE) 5 MG immediate release tablet Take 2 tablets (10 mg total) by mouth 4 (four) times daily as needed for severe pain. Patient not taking: Reported on 08/12/2016 07/29/16    Hildred Laser, MD  oxyCODONE-acetaminophen (PERCOCET/ROXICET) 5-325 MG tablet Take 1 tablet by mouth every 6 (six) hours as needed for severe pain. 10/12/19   Cuthriell, Delorise Royals, PA-C  potassium chloride (K-DUR) 10 MEQ tablet Take 1 tablet (10 mEq total) daily by mouth. 05/17/17   Emily Filbert, MD  promethazine (PHENERGAN) 25 MG tablet Take 1 tablet (25 mg total) by mouth every 6 (six) hours as needed for nausea or vomiting. 10/12/19   Cuthriell, Delorise Royals, PA-C  sulfamethoxazole-trimethoprim (BACTRIM DS) 800-160 MG tablet Take 1 tablet 2 (two) times daily by mouth. 05/17/17   Emily Filbert, MD    Allergies Meropenem, Penicillins, Doxycycline, Motrin [ibuprofen], Tape, Toradol [ketorolac tromethamine], Tramadol, Amoxicillin, Ketorolac, Methadone, and Naproxen sodium  Family History  Problem Relation Age of Onset  . Kidney disease Mother   . Pancreatic cancer Father   . Bladder Cancer Neg Hx   . Kidney cancer Neg Hx   . Prostate cancer Neg Hx     Social History Social History   Tobacco Use  . Smoking status: Current Every Day Smoker    Packs/day: 0.25    Types: Cigarettes  . Smokeless tobacco: Never Used  Substance Use Topics  . Alcohol use: No  . Drug use: No    Review of Systems  Constitutional: No fever. Eyes: No redness. ENT: No sore throat. Cardiovascular: Denies chest pain. Respiratory: Denies shortness of breath. Gastrointestinal: Positive for nausea. Genitourinary: Positive for flank pain and hematuria. Musculoskeletal: Negative for back pain. Skin: Negative for rash. Neurological: Negative for headache.   ____________________________________________   PHYSICAL EXAM:  VITAL SIGNS: ED Triage Vitals [10/13/19 0531]  Enc Vitals Group     BP (!) 109/56     Pulse Rate (!) 105     Resp 19     Temp 98.8 F (37.1 C)     Temp src      SpO2 96 %     Weight      Height      Head Circumference      Peak Flow      Pain Score      Pain  Loc      Pain Edu?      Excl. in GC?     Constitutional: Alert and oriented. Well appearing and in no acute distress. Eyes: Conjunctivae are normal.  Head: Atraumatic. Nose: No congestion/rhinnorhea. Mouth/Throat: Mucous membranes are moist.   Neck: Normal range of motion.  Cardiovascular:  Good peripheral circulation. Respiratory: Normal respiratory effort.  No retractions.  Gastrointestinal: Soft and nontender. No distention.  Genitourinary: Left flank tenderness. Musculoskeletal: Extremities warm and well perfused.  Neurologic:  Normal speech and language. No gross focal neurologic deficits are appreciated.  Skin:  Skin is warm and dry. No rash noted. Psychiatric: Mood and affect are normal. Speech and behavior are normal.  ____________________________________________   LABS (all labs ordered are listed, but only abnormal results are displayed)  Labs Reviewed  RESPIRATORY PANEL BY RT PCR (FLU A&B, COVID)   ____________________________________________  EKG  ____________________________________________  RADIOLOGY    ____________________________________________   PROCEDURES  Procedure(s) performed: No  Procedures  Critical Care performed: No ____________________________________________   INITIAL IMPRESSION / ASSESSMENT AND PLAN / ED COURSE  Pertinent labs & imaging results that were available during my care of the patient were reviewed by me and considered in my medical decision making (see chart for details).  54 year old female with a history of kidney stones and other PMH as noted above presents with persistent left flank pain since yesterday after she left the hospital.  I reviewed the past medical records in Epic.  The patient was seen in the ED yesterday and had a CT showing a 5 mm distal left ureteral stone with severe hydronephrosis and hydroureter.  The patient was discharged with Percocet and Phenergan.  She reports that she has had no significant  relief.  On exam, the patient is very uncomfortable appearing but in no acute distress.  Her vital signs are normal except for tachycardia.  She has tenderness in the left flank.  Overall presentation is consistent with continued pain due to the left-sided ureteral stone.  We will give fluids, analgesia, and consult urology.  ----------------------------------------- 6:32 AM on 10/13/2019 -----------------------------------------  The patient has some improvement with Dilaudid, but still reports relatively severe flank pain.  I consulted Dr. Annabell Howells from urology who recommends taking the patient to the OR for intervention.  The patient agrees with this plan.  ____________________________________________   FINAL CLINICAL IMPRESSION(S) / ED DIAGNOSES  Final diagnoses:  Ureteral stone      NEW MEDICATIONS STARTED DURING THIS VISIT:  New Prescriptions   No medications on file     Note:  This document was prepared using Dragon voice recognition software and may include unintentional dictation errors.    Dionne Bucy, MD 10/13/19 [phone]

## 2019-10-13 NOTE — Anesthesia Procedure Notes (Signed)
Procedure Name: LMA Insertion Date/Time: 10/13/2019 8:32 AM Performed by: Almeta Monas, CRNA Pre-anesthesia Checklist: Patient identified, Patient being monitored, Timeout performed, Emergency Drugs available and Suction available Patient Re-evaluated:Patient Re-evaluated prior to induction Oxygen Delivery Method: Circle system utilized Preoxygenation: Pre-oxygenation with 100% oxygen Induction Type: IV induction Ventilation: Mask ventilation without difficulty LMA: LMA inserted LMA Size: 3.5 Tube type: Oral Number of attempts: 1 Placement Confirmation: positive ETCO2 and breath sounds checked- equal and bilateral Tube secured with: Tape Dental Injury: Teeth and Oropharynx as per pre-operative assessment

## 2019-10-13 NOTE — ED Notes (Signed)
Report to OR, pt to OR at this time

## 2019-10-13 NOTE — Anesthesia Preprocedure Evaluation (Signed)
Anesthesia Evaluation  Patient identified by MRN, date of birth, ID band Patient awake    Reviewed: Allergy & Precautions, H&P , NPO status , Patient's Chart, lab work & pertinent test results  History of Anesthesia Complications (+) history of anesthetic complications  Airway Mallampati: III  TM Distance: >3 FB Neck ROM: full    Dental  (+) Chipped, Poor Dentition   Pulmonary neg shortness of breath, pneumonia, COPD, Current Smoker and Patient abstained from smoking.,           Cardiovascular Exercise Tolerance: Good (-) angina(-) Past MI and (-) DOE      Neuro/Psych  Headaches, negative psych ROS   GI/Hepatic negative GI ROS, Neg liver ROS,   Endo/Other  negative endocrine ROS  Renal/GU Renal disease     Musculoskeletal   Abdominal   Peds  Hematology negative hematology ROS (+)   Anesthesia Other Findings Past Medical History: 05/22/2012: Breast mass 10/15/2014: Chronic cystitis 05/31/2012: Chronic pain No date: Complication of anesthesia     Comment:  hard for me to go under  09/17/2014: Gross hematuria No date: History of kidney stones 04/29/2016: Hydronephrosis with urinary obstruction due to ureteral  calculus 03/23/2016: Hypokalemia No date: Kidney stone 2013: Meningitis 2014: Pneumonia 04/17/2016: Pyelonephritis 09/17/2014: Renal colic No date: Renal disorder No date: Rib fracture 2014: Traumatic brain injury ([facility]) 04/29/2016: Ureterolithiasis  Past Surgical History: No date: ABDOMINAL HYSTERECTOMY 2016: BREAST SURGERY; Bilateral     Comment:  breast implants No date: CLAVICLE SURGERY 05/11/2016: CYSTOSCOPY W/ URETERAL STENT PLACEMENT; Right     Comment:  Procedure: CYSTOSCOPY WITH STENT REPLACEMENT;  Surgeon:               Hildred Laser, MD;  Location: [facility];  Service:               Urology;  Laterality: Right; 04/16/2016: CYSTOSCOPY WITH STENT PLACEMENT; N/A     Comment:  Procedure:  CYSTOSCOPY WITH STENT PLACEMENT and               retrograde pyelogram;  Surgeon: Zonia Kief, MD;                Location: [facility];  Service: Urology;  Laterality: N/A; No date: FACIAL FRACTURE SURGERY     Comment:  baseball accident- pins placed No date: FEMUR FRACTURE SURGERY 2001: FRACTURE SURGERY     Comment:  face 07/29/2016: IR GENERIC HISTORICAL     Comment:  IR NEPHROSTOMY PLACEMENT LEFT 07/29/2016 [facility]-INTERV RAD 07/29/2016: NEPHROLITHOTOMY; Left     Comment:  Procedure: NEPHROLITHOTOMY PERCUTANEOUS;  Surgeon: Hildred Laser, MD;  Location: [facility];  Service: Urology;              Laterality: Left; 08/2015: RIB FRACTURE SURGERY     Comment:  pins and screws 05/11/2016: URETEROSCOPY WITH HOLMIUM LASER LITHOTRIPSY; Right     Comment:  Procedure: URETEROSCOPY WITH HOLMIUM LASER LITHOTRIPSY;               Surgeon: Hildred Laser, MD;  Location: [facility];                Service: Urology;  Laterality: Right;  BMI    Body Mass Index: 25.40 kg/m      Reproductive/Obstetrics negative OB ROS  Anesthesia Physical Anesthesia Plan  ASA: III  Anesthesia Plan: General LMA   Post-op Pain Management:    Induction: Intravenous  PONV Risk Score and Plan: Dexamethasone, Ondansetron, Midazolam and Treatment may vary due to age or medical condition  Airway Management Planned: LMA  Additional Equipment:   Intra-op Plan:   Post-operative Plan: Extubation in OR  Informed Consent: I have reviewed the patients History and Physical, chart, labs and discussed the procedure including the risks, benefits and alternatives for the proposed anesthesia with the patient or authorized representative who has indicated his/her understanding and acceptance.     Dental Advisory Given  Plan Discussed with: Anesthesiologist, CRNA and Surgeon  Anesthesia Plan Comments: (Patient consented for risks of anesthesia including  but not limited to:  - adverse reactions to medications - damage to teeth, lips or other oral mucosa - sore throat or hoarseness - Damage to heart, brain, lungs or loss of life  Patient voiced understanding.)        Anesthesia Quick Evaluation

## 2019-10-13 NOTE — ED Triage Notes (Signed)
Patient dx with 60mm renal calculi yesterday in this ED. Patient unable to fill prescriptions post discharge from ED. Patient c/o continued pain.

## 2019-10-14 ENCOUNTER — Observation Stay: Payer: Medicaid Other

## 2019-10-14 DIAGNOSIS — R0600 Dyspnea, unspecified: Secondary | ICD-10-CM | POA: Diagnosis not present

## 2019-10-14 DIAGNOSIS — R000 Tachycardia, unspecified: Secondary | ICD-10-CM

## 2019-10-14 DIAGNOSIS — R0609 Other forms of dyspnea: Secondary | ICD-10-CM

## 2019-10-14 DIAGNOSIS — R0902 Hypoxemia: Secondary | ICD-10-CM

## 2019-10-14 LAB — CBC
HCT: 35 % — ABNORMAL LOW (ref 36.0–46.0)
HCT: 38.7 % (ref 36.0–46.0)
Hemoglobin: 11.4 g/dL — ABNORMAL LOW (ref 12.0–15.0)
Hemoglobin: 12.3 g/dL (ref 12.0–15.0)
MCH: 30.9 pg (ref 26.0–34.0)
MCH: 31.1 pg (ref 26.0–34.0)
MCHC: 32.6 g/dL (ref 30.0–36.0)
MCHC: 31.8 g/dL (ref 30.0–36.0)
MCV: 94.9 fL (ref 80.0–100.0)
MCV: 97.7 fL (ref 80.0–100.0)
Platelets: 138 10*3/uL — ABNORMAL LOW (ref 150–400)
Platelets: 121 10*3/uL — ABNORMAL LOW (ref 150–400)
RBC: 3.69 MIL/uL — ABNORMAL LOW (ref 3.87–5.11)
RBC: 3.96 MIL/uL (ref 3.87–5.11)
RDW: 13.6 % (ref 11.5–15.5)
RDW: 13.8 % (ref 11.5–15.5)
WBC: 14.2 10*3/uL — ABNORMAL HIGH (ref 4.0–10.5)
WBC: 9.4 10*3/uL (ref 4.0–10.5)
nRBC: 0 % (ref 0.0–0.2)
nRBC: 0 % (ref 0.0–0.2)

## 2019-10-14 LAB — BLOOD GAS, ARTERIAL
Acid-Base Excess: 1.8 mmol/L (ref 0.0–2.0)
Bicarbonate: 25.7 mmol/L (ref 20.0–28.0)
FIO2: 0.21
O2 Saturation: 91 %
Patient temperature: 37
pCO2 arterial: 37 mmHg (ref 32.0–48.0)
pH, Arterial: 7.45 (ref 7.350–7.450)
pO2, Arterial: 58 mmHg — ABNORMAL LOW (ref 83.0–108.0)

## 2019-10-14 LAB — TROPONIN I (HIGH SENSITIVITY): Troponin I (High Sensitivity): 57 ng/L — ABNORMAL HIGH (ref <18)

## 2019-10-14 LAB — BASIC METABOLIC PANEL
Anion gap: 7 (ref 5–15)
Anion gap: 10 (ref 5–15)
BUN: 14 mg/dL (ref 6–20)
BUN: 10 mg/dL (ref 6–20)
CO2: 25 mmol/L (ref 22–32)
CO2: 24 mmol/L (ref 22–32)
Calcium: 8.8 mg/dL — ABNORMAL LOW (ref 8.9–10.3)
Calcium: 8.7 mg/dL — ABNORMAL LOW (ref 8.9–10.3)
Chloride: 104 mmol/L (ref 98–111)
Chloride: 101 mmol/L (ref 98–111)
Creatinine, Ser: 0.77 mg/dL (ref 0.44–1.00)
Creatinine, Ser: 0.87 mg/dL (ref 0.44–1.00)
GFR calc Af Amer: >60 mL/min (ref >60)
GFR calc Af Amer: >60 mL/min (ref >60)
GFR calc non Af Amer: >60 mL/min (ref >60)
GFR calc non Af Amer: >60 mL/min (ref >60)
Glucose, Bld: 116 mg/dL — ABNORMAL HIGH (ref 70–99)
Glucose, Bld: 118 mg/dL — ABNORMAL HIGH (ref 70–99)
Potassium: 4.1 mmol/L (ref 3.5–5.1)
Potassium: 4.2 mmol/L (ref 3.5–5.1)
Sodium: 136 mmol/L (ref 135–145)
Sodium: 135 mmol/L (ref 135–145)

## 2019-10-14 LAB — BRAIN NATRIURETIC PEPTIDE: B Natriuretic Peptide: 358 pg/mL — ABNORMAL HIGH (ref 0.0–100.0)

## 2019-10-14 LAB — PROTIME-INR
INR: 1 (ref 0.8–1.2)
Prothrombin Time: 12.7 seconds (ref 11.4–15.2)

## 2019-10-14 LAB — HIV ANTIBODY (ROUTINE TESTING W REFLEX): HIV Screen 4th Generation wRfx: NONREACTIVE

## 2019-10-14 LAB — TSH: TSH: 1.801 u[IU]/mL (ref 0.350–4.500)

## 2019-10-14 LAB — LACTIC ACID, PLASMA: Lactic Acid, Venous: 1.3 mmol/L (ref 0.5–1.9)

## 2019-10-14 MED ORDER — FUROSEMIDE 10 MG/ML IJ SOLN
40.0000 mg | Freq: Once | INTRAMUSCULAR | Status: AC
  Administered 2019-10-14: 40 mg via INTRAVENOUS
  Filled 2019-10-14: qty 4

## 2019-10-14 MED ORDER — IOHEXOL 350 MG/ML SOLN
75.0000 mL | Freq: Once | INTRAVENOUS | Status: AC | PRN
  Administered 2019-10-14: 75 mL via INTRAVENOUS

## 2019-10-14 MED ORDER — PROMETHAZINE HCL 25 MG/ML IJ SOLN
12.5000 mg | INTRAMUSCULAR | Status: DC | PRN
  Administered 2019-10-14 – 2019-10-19 (×5): 12.5 mg via INTRAVENOUS
  Filled 2019-10-14 (×5): qty 1

## 2019-10-14 MED ORDER — SODIUM CHLORIDE 0.9 % IV SOLN
1.0000 g | INTRAVENOUS | Status: DC
  Administered 2019-10-14: 12:00:00 1 g via INTRAVENOUS
  Filled 2019-10-14: qty 1
  Filled 2019-10-14: qty 10

## 2019-10-14 MED ORDER — TAMSULOSIN HCL 0.4 MG PO CAPS
0.4000 mg | ORAL_CAPSULE | Freq: Every day | ORAL | Status: DC
  Administered 2019-10-14 – 2019-10-20 (×6): 0.4 mg via ORAL
  Filled 2019-10-14 (×6): qty 1

## 2019-10-14 NOTE — Consult Note (Signed)
Medical Consultation   Teresa Davenport  DQQ:[id number]  DOB: [birth year]  DOA: 10/13/2019  PCP: Armando Gang, FNP   Requesting physician: Dr. Lonna Cobb  Reason for consultation: Postprocedural tachycardia and shortness of breath  History of Present Illness: Teresa Davenport is an 54 y.o. female with a history of nephrolithiasis, admitted to the urology service on 10/13/2019, with pain from obstructive calculus, postop day 1 of left ureteral stent placement with noted pyelonephrosis during the procedure, and kept inpatient for antibiotic treatment, now on Rocephin and Cipro, who I was asked to see in consultation for management of tachycardia and shortness of breath. Patient continues to have flank pain following her procedure. She has been having shortness of breath with minimal exertion and was placed on oxygen for comfort. Lowest O2 sat recorded was 91% on room air. She continues to complain of severe left flank pain with minimal relief with as needed oxycodone and Dilaudid    Review of Systems:  ROS As per HPI otherwise 10 point review of systems negative.   Past Medical History: Past Medical History:  Diagnosis Date  . Breast mass 05/22/2012  . Chronic cystitis 10/15/2014  . Chronic pain 05/31/2012  . Complication of anesthesia    hard for me to go under   . Gross hematuria 09/17/2014  . History of kidney stones   . Hydronephrosis with urinary obstruction due to ureteral calculus 04/29/2016  . Hypokalemia 03/23/2016  . Kidney stone   . Meningitis 2013  . Pneumonia 2014  . Pyelonephritis 04/17/2016  . Renal colic 09/17/2014  . Renal disorder   . Rib fracture   . Traumatic brain injury (HCC) 2014  . Ureterolithiasis 04/29/2016    Past Surgical History: Past Surgical History:  Procedure Laterality Date  . ABDOMINAL HYSTERECTOMY    . BREAST SURGERY Bilateral 2016   breast implants  . CLAVICLE SURGERY    . CYSTOSCOPY W/ URETERAL STENT PLACEMENT Right  05/11/2016   Procedure: CYSTOSCOPY WITH STENT REPLACEMENT;  Surgeon: Hildred Laser, MD;  Location: [facility];  Service: Urology;  Laterality: Right;  . CYSTOSCOPY WITH STENT PLACEMENT N/A 04/16/2016   Procedure: CYSTOSCOPY WITH STENT PLACEMENT and retrograde pyelogram;  Surgeon: Zonia Kief, MD;  Location: [facility];  Service: Urology;  Laterality: N/A;  . CYSTOSCOPY WITH URETEROSCOPY, STONE BASKETRY AND STENT PLACEMENT Left 10/13/2019   Procedure: CYSTOSCOPY WITH URETEROSCOPY, STENT PLACEMENT;  Surgeon: Bjorn Pippin, MD;  Location: [facility];  Service: Urology;  Laterality: Left;  . FACIAL FRACTURE SURGERY     baseball accident- pins placed  . FEMUR FRACTURE SURGERY    . FRACTURE SURGERY  2001   face  . IR GENERIC HISTORICAL  07/29/2016   IR NEPHROSTOMY PLACEMENT LEFT 07/29/2016 [facility]INTER[facility]  . NEPHROLITHOTOMY Left 07/29/2016   Procedure: NEPHROLITHOTOMY PERCUTANEOUS;  Surgeon: Hildred Laser, MD;  Location: [facility];  Service: Urology;  Laterality: Left;  . RIB FRACTURE SURGERY  08/2015   pins and screws  . URETEROSCOPY WITH HOLMIUM LASER LITHOTRIPSY Right 05/11/2016   Procedure: URETEROSCOPY WITH HOLMIUM LASER LITHOTRIPSY;  Surgeon: Hildred Laser, MD;  Location: [facility];  Service: Urology;  Laterality: Right;     Allergies:   Allergies  Allergen Reactions  . Meropenem Shortness Of Breath  . Penicillins Anaphylaxis and Swelling    Has patient had a PCN reaction causing immediate rash, facial/tongue/throat swelling, SOB or lightheadedness with hypotension: Yes Has  patient had a PCN reaction causing severe rash involving mucus membranes or skin necrosis: No Has patient had a PCN reaction that required hospitalization No Has patient had a PCN reaction occurring within the last 10 years: No If all of the above answers are "NO", then may proceed with Cephalosporin use.  Marland Kitchen Doxycycline Swelling  . Motrin [Ibuprofen] Swelling  . Tape     Patient says she cannot tolerate any tape  but tegaderm  . Toradol [Ketorolac Tromethamine] Swelling  . Tramadol Swelling  . Amoxicillin Rash  . Ketorolac Rash  . Methadone Rash  . Naproxen Sodium Rash     Social History:  reports that she has been smoking cigarettes. She has been smoking about 0.25 packs per day. She has never used smokeless tobacco. She reports that she does not drink alcohol or use drugs.   Family History: Family History  Problem Relation Age of Onset  . Kidney disease Mother   . Pancreatic cancer Father   . Bladder Cancer Neg Hx   . Kidney cancer Neg Hx   . Prostate cancer Neg Hx     Unacceptable: Noncontributory, unremarkable, or negative. Acceptable: Family history reviewed and not pertinent (If you reviewed it)   Physical Exam: Vitals:   10/14/19 1803 10/14/19 1836 10/14/19 1943 10/14/19 2010  BP: 116/66 (!) 113/57 110/68 (!) 114/54  Pulse: (!) 115 (!) 116 (!) 120 (!) 108  Resp: (!) 22 18  18   Temp: 99.2 F (37.3 C) 98.3 F (36.8 C) 97.9 F (36.6 C) 98.9 F (37.2 C)  TempSrc: Oral Oral Oral Oral  SpO2: 93% 94% 91% 94%  Weight:      Height:        Constitutional: Appearance,  Alert and awake, oriented x3, appears to be in intractable pain  eyes: PERLA, EOMI, irises appear normal, anicteric sclera,  ENMT: external ears and nose appear normal, normal hearing or hard of hearing            Lips appears normal, oropharynx mucosa, tongue, posterior pharynx appear normal  Neck: neck appears normal, no masses, normal ROM, no thyromegaly, no JVD  CVS: S1-S2 clear, no murmur rubs or gallops, no LE edema, normal pedal pulses  Respiratory:  clear to auscultation bilaterally, no wheezing, rales or rhonchi. Respiratory effort normal. No accessory muscle use.  Abdomen: soft left CVA tenderness, nondistended, normal bowel sounds, no hepatosplenomegaly, no hernias  Musculoskeletal: : no cyanosis, clubbing or edema noted bilaterally                   Neuro: Cranial nerves II-XII intact, strength,  sensation, reflexes Psych: judgement and insight appear normal, stable mood and affect, mental status Skin: no rashes or lesions or ulcers, no induration or nodules    Data reviewed:  I have personally reviewed following labs and imaging studies Labs:  CBC: Recent Labs  Lab 10/12/19 1408 10/14/19 1042  WBC 9.6 14.2*  NEUTROABS 8.6*  --   HGB 14.4 11.4*  HCT 43.7 35.0*  MCV 93.4 94.9  PLT 254 138*    Basic Metabolic Panel: Recent Labs  Lab 10/12/19 1408 10/14/19 1042  NA 139 136  K 3.7 4.1  CL 102 104  CO2 25 25  GLUCOSE 129* 116*  BUN 15 14  CREATININE 0.88 0.77  CALCIUM 9.3 8.8*   GFR Estimated Creatinine Clearance: 71 mL/min (by C-G formula based on SCr of 0.77 mg/dL). Liver Function Tests: Recent Labs  Lab 10/12/19 1408  AST 40  ALT 29  ALKPHOS 83  BILITOT 0.8  PROT 7.7  ALBUMIN 4.4   Recent Labs  Lab 10/12/19 1408  LIPASE 24   No results for input(s): AMMONIA in the last 168 hours. Coagulation profile No results for input(s): INR, PROTIME in the last 168 hours.  Cardiac Enzymes: No results for input(s): CKTOTAL, CKMB, CKMBINDEX, TROPONINI in the last 168 hours. BNP: Invalid input(s): POCBNP CBG: No results for input(s): GLUCAP in the last 168 hours. D-Dimer No results for input(s): DDIMER in the last 72 hours. Hgb A1c No results for input(s): HGBA1C in the last 72 hours. Lipid Profile No results for input(s): CHOL, HDL, LDLCALC, TRIG, CHOLHDL, LDLDIRECT in the last 72 hours. Thyroid function studies No results for input(s): TSH, T4TOTAL, T3FREE, THYROIDAB in the last 72 hours.  Invalid input(s): FREET3 Anemia work up No results for input(s): VITAMINB12, FOLATE, FERRITIN, TIBC, IRON, RETICCTPCT in the last 72 hours. Urinalysis    Component Value Date/Time   COLORURINE YELLOW (A) 10/12/2019 1408   APPEARANCEUR CLEAR (A) 10/12/2019 1408   APPEARANCEUR Cloudy (A) 08/12/2016 1108   LABSPEC 1.015 10/12/2019 1408   LABSPEC 1.004 12/20/2013  1115   PHURINE 6.0 10/12/2019 1408   GLUCOSEU NEGATIVE 10/12/2019 1408   GLUCOSEU Negative 12/20/2013 1115   HGBUR NEGATIVE 10/12/2019 1408   BILIRUBINUR NEGATIVE 10/12/2019 1408   BILIRUBINUR Negative 08/12/2016 1108   BILIRUBINUR Negative 12/20/2013 1115   KETONESUR NEGATIVE 10/12/2019 1408   PROTEINUR NEGATIVE 10/12/2019 1408   NITRITE NEGATIVE 10/12/2019 1408   LEUKOCYTESUR NEGATIVE 10/12/2019 1408   LEUKOCYTESUR Negative 12/20/2013 1115     Microbiology Recent Results (from the past 240 hour(s))  Urine Culture     Status: Abnormal (Preliminary result)   Collection Time: 10/13/19  5:25 AM   Specimen: Urine, Random  Result Value Ref Range Status   Specimen Description URINE, RANDOM  Final   Special Requests NONE  Final   Culture (A)  Final    >=100,000 COLONIES/mL ESCHERICHIA COLI SUSCEPTIBILITIES TO FOLLOW Performed at [facility], [address], [place], [place] [address]    Report Status PENDING  Incomplete  Respiratory Panel by RT PCR (Flu A&B, Covid) - Nasopharyngeal Swab     Status: None   Collection Time: 10/13/19  6:33 AM   Specimen: Nasopharyngeal Swab  Result Value Ref Range Status   SARS Coronavirus 2 by RT PCR NEGATIVE NEGATIVE Final    Comment: (NOTE) SARS-CoV-2 target nucleic acids are NOT DETECTED. The SARS-CoV-2 RNA is generally detectable in upper respiratoy specimens during the acute phase of infection. The lowest concentration of SARS-CoV-2 viral copies this assay can detect is 131 copies/mL. A negative result does not preclude SARS-Cov-2 infection and should not be used as the sole basis for treatment or other patient management decisions. A negative result may occur with  improper specimen collection/handling, submission of specimen other than nasopharyngeal swab, presence of viral mutation(s) within the areas targeted by this assay, and inadequate number of viral copies (<131 copies/mL). A negative result must be combined with  clinical observations, patient history, and epidemiological information. The expected result is Negative. Fact Sheet for Patients:  https://www.moore.com/ Fact Sheet for Healthcare Providers:  https://www.young.biz/ This test is not yet ap proved or cleared by the [place] FDA and  has been authorized for detection and/or diagnosis of SARS-CoV-2 by FDA under an Emergency Use Authorization (EUA). This EUA will remain  in effect (meaning this test can be used) for the duration of the  COVID-19 declaration under Section 564(b)(1) of the Act, 21 U.S.C. section 360bbb-3(b)(1), unless the authorization is terminated or revoked sooner.    Influenza A by PCR NEGATIVE NEGATIVE Final   Influenza B by PCR NEGATIVE NEGATIVE Final    Comment: (NOTE) The Xpert Xpress SARS-CoV-2/FLU/RSV assay is intended as an aid in  the diagnosis of influenza from Nasopharyngeal swab specimens and  should not be used as a sole basis for treatment. Nasal washings and  aspirates are unacceptable for Xpert Xpress SARS-CoV-2/FLU/RSV  testing. Fact Sheet for Patients: PinkCheek.be Fact Sheet for Healthcare Providers: GravelBags.it This test is not yet approved or cleared by the [place] FDA and  has been authorized for detection and/or diagnosis of SARS-CoV-2 by  FDA under an Emergency Use Authorization (EUA). This EUA will remain  in effect (meaning this test can be used) for the duration of the  Covid-19 declaration under Section 564(b)(1) of the Act, 21  U.S.C. section 360bbb-3(b)(1), unless the authorization is  terminated or revoked. Performed at [facility], [address] [place], [place] [address]        Inpatient Medications:   Scheduled Meds: Continuous Infusions: . 0.45 % NaCl with KCl 20 mEq / L 100 mL/hr at 10/14/19 1936  . cefTRIAXone (ROCEPHIN)  IV 1 g (10/14/19 1213)      Radiological Exams on Admission: DG Abd 1 View  Result Date: 10/14/2019 CLINICAL DATA:  Flank pain EXAM: ABDOMEN - 1 VIEW COMPARISON:  CT abdomen 10/12/2019 FINDINGS: There is no bowel dilatation to suggest obstruction. There is no evidence of pneumoperitoneum, portal venous gas or pneumatosis. Left nephroureteral stent. 17 mm left lower pole renal calculus. 6 mm distal left ureteral calculus. The osseous structures are unremarkable. IMPRESSION: Left nephroureteral stent in satisfactory position. 17 mm left lower pole renal calculus. 6 mm distal left ureteral calculus. Electronically Signed   By: Kathreen Devoid   On: 10/14/2019 14:05   DG OR UROLOGY CYSTO IMAGE ([place])  Result Date: 10/13/2019 There is no interpretation for this exam.  This order is for images obtained during a surgical procedure.  Please See "Surgeries" Tab for more information regarding the procedure.    Impression/Recommendations    Dyspnea on exertion   Hypoxia   Tachycardia -Suspect primarily related to splinting from persistent severe left flank pain -Concerning differentials include PE, sepsis, fluid overload. covid test was negative - WBC 14.2, recent fever, possible UTI, recent instrumentation, tachycardia and hypoxia -Continue oxygen supplementation  -CXR, EKG,ABG, troponin, BNP, cbc, basic,TSH, lactic acid,CTA chest -Continue supplemental oxygen -Continue IV hydration -Pain control  Left ureteral lithiasis status post stent placement 10/13/2019 -Patient continues to have severe left flank pain  --Continued management per urology -Urinalysis on 10/12/2019 was negative -Currently on empiric antibiotics of Rocephin and Cipro. -Does not appear septic, however if she rules in for sepsis we will switch antibiotic to cefepime. -Patient is afebrile, but with tachycardia and hypoxia -Lactic acid to evaluate for possibility of sepsis -Continue IV hydration     Thank you for this consultation.  Our [facility]  hospitalist team will follow the patient with you.   Time Spent: 42  Athena Masse M.D. [facility] 10/14/2019, 8:21 PM

## 2019-10-14 NOTE — Progress Notes (Addendum)
Urology Inpatient Progress Note  Subjective: Teresa Davenport is a 54 y.o. female with a history of nephrolithiasis admitted on 10/13/2019 with a 5 mm distal left ureteral stone.  She is POD 1 from left ureteral stent placement with Dr. Jeffie Pollock.  Intraoperative findings notable for pyonephrosis.  Urine culture pending.  She is afebrile, VSS.  On antibiotics as below.  Today, she reports significant left flank pain that is exacerbated by movement as well as nausea and vomiting.  She states she has had ureteral stents in the past and did not experience discomfort with these.  She is concerned that due to her history of multiple urologic procedures, she has developed antibiotic resistance and that her current antibiotics will not work.  Of note, most recent positive urine culture dated 02/27/2019 resulted with ampicillin, ciprofloxacin, levofloxacin, nitrofurantoin, and Bactrim resistant E. coli.  Anti-infectives: Anti-infectives (From admission, onward)   Start     Dose/Rate Route Frequency Ordered Stop   10/13/19 2000  ciprofloxacin (CIPRO) IVPB 400 mg     400 mg 200 mL/hr over 60 Minutes Intravenous Every 12 hours 10/13/19 1036     10/13/19 0840  ciprofloxacin (CIPRO) 400 MG/200ML IVPB    Note to Pharmacy: Huffines, Mindy   : cabinet override      10/13/19 0840 10/13/19 0844   10/13/19 0000  ciprofloxacin (CIPRO) 500 MG tablet     500 mg Oral 2 times daily 10/13/19 0912 10/23/19 2359      Current Facility-Administered Medications  Medication Dose Route Frequency Provider Last Rate Last Admin  . 0.45 % NaCl with KCl 20 mEq / L infusion   Intravenous Continuous Irine Seal, MD 100 mL/hr at 10/14/19 0846 New Bag at 10/14/19 0846  . acetaminophen (OFIRMEV) IV 1,000 mg  1,000 mg Intravenous Q6H PRN Irine Seal, MD   Stopped at 10/13/19 1711  . acetaminophen (TYLENOL) tablet 650 mg  650 mg Oral Q4H PRN Irine Seal, MD   650 mg at 10/14/19 0031  . bisacodyl (DULCOLAX) suppository 10 mg  10 mg  Rectal Daily PRN Irine Seal, MD      . ciprofloxacin (CIPRO) IVPB 400 mg  400 mg Intravenous Q12H Irine Seal, MD 200 mL/hr at 10/14/19 0848 400 mg at 10/14/19 0848  . diphenhydrAMINE (BENADRYL) injection 12.5-25 mg  12.5-25 mg Intravenous Q6H PRN Irine Seal, MD       Or  . diphenhydrAMINE (BENADRYL) 12.5 MG/5ML elixir 12.5-25 mg  12.5-25 mg Oral Q6H PRN Irine Seal, MD      . HYDROmorphone (DILAUDID) injection 0.5-1 mg  0.5-1 mg Intravenous Q2H PRN Irine Seal, MD   1 mg at 10/14/19 5631  . ondansetron (ZOFRAN) injection 4 mg  4 mg Intravenous Q4H PRN Irine Seal, MD   4 mg at 10/14/19 0320  . opium-belladonna (B&O SUPPRETTES) 16.2-60 MG suppository 1 suppository  1 suppository Rectal Q6H PRN Irine Seal, MD      . oxybutynin (DITROPAN) tablet 5 mg  5 mg Oral Q8H PRN Irine Seal, MD      . oxyCODONE (Oxy IR/ROXICODONE) immediate release tablet 5 mg  5 mg Oral Q4H PRN Irine Seal, MD   5 mg at 10/14/19 0820  . phenazopyridine (PYRIDIUM) tablet 200 mg  200 mg Oral TID PRN Irine Seal, MD      . promethazine (PHENERGAN) injection 12.5 mg  12.5 mg Intravenous Q4H PRN Irine Seal, MD   12.5 mg at 10/14/19 0447  . senna-docusate (Senokot-S) tablet 1 tablet  1  tablet Oral QHS PRN Bjorn Pippin, MD      . sodium phosphate (FLEET) 7-19 GM/118ML enema 1 enema  1 enema Rectal Once PRN Bjorn Pippin, MD      . zolpidem (AMBIEN) tablet 5 mg  5 mg Oral QHS PRN Bjorn Pippin, MD       Objective: Vital signs in last 24 hours: Temp:  [97.5 F (36.4 C)-98.8 F (37.1 C)] 97.9 F (36.6 C) (04/05 0521) Pulse Rate:  [85-118] 103 (04/05 0521) Resp:  [16-24] 20 (04/05 0521) BP: (85-112)/(41-73) 107/73 (04/05 0521) SpO2:  [86 %-100 %] 91 % (04/05 0521)  Intake/Output from previous day: 04/04 0701 - 04/05 0700 In: 2974.4 [P.O.:360; I.V.:2130; IV Piggyback:484.4] Out: 950 [Urine:950] Intake/Output this shift: No intake/output data recorded.  Physical Exam Vitals and nursing note reviewed.  Constitutional:       General: She is not in acute distress.    Appearance: She is not ill-appearing, toxic-appearing or diaphoretic.     Comments: Uncomfortable appearing  HENT:     Head: Normocephalic and atraumatic.  Pulmonary:     Effort: Pulmonary effort is normal. No respiratory distress.  Skin:    General: Skin is warm and dry.  Neurological:     Mental Status: She is alert and oriented to person, place, and time.  Psychiatric:        Mood and Affect: Mood normal.        Behavior: Behavior normal.    Lab Results:  Recent Labs    10/12/19 1408  WBC 9.6  HGB 14.4  HCT 43.7  PLT 254   BMET Recent Labs    10/12/19 1408  NA 139  K 3.7  CL 102  CO2 25  GLUCOSE 129*  BUN 15  CREATININE 0.88  CALCIUM 9.3   Assessment & Plan: 54 year old female with a history of nephrolithiasis now POD 1 from left ureteral stent placement for management of a 5 mm distal left ureteral stone with intraoperative findings notable for pyonephrosis.  Patient reports left flank pain associated with nausea and vomiting, likely consistent with stent discomfort.  On empiric Cipro with urine culture pending, however most recent positive urine culture 8 months ago resulted with Cipro resistant E. coli.  Plan: -Repeat CBC and BMP today -Start empiric ceftriaxone and stop ciprofloxacin given recent resistance patterns on recent urine culture (patient appears to tolerated ceftriaxone well in the past despite documented penicillin allergy) -Follow urine culture -Encourage augmenting narcotics with oxybutynin and B&O suppositories for pain control  Carman Ching, PA-C 10/14/2019

## 2019-10-14 NOTE — Progress Notes (Signed)
1308: Notified Vaillancourt PA patient is asking for stronger pain meds.  1600: Patient was sleeping so this nurse did not wake her.  1700: Patient got up walking around hall almost falling. MEWS score at a 5, HR and temp elevated. Notified Dr. Lonna Cobb about this and gave patient Tylenol for fever. Dr. Lonna Cobb put in consult for a hospitalist.  1900: Patient stating she is now having a hard time urinating, notified Dr. Lonna Cobb who ordered for a bladder scan.

## 2019-10-14 NOTE — Progress Notes (Signed)
Notified Dr. Annabell Howells of pt's increase in pain level, and nausea & vomiting. Pt was ordered 12.5 mg of Phenergan IV q.4 hours PRN.

## 2019-10-15 ENCOUNTER — Telehealth: Payer: Self-pay | Admitting: Urology

## 2019-10-15 ENCOUNTER — Observation Stay (HOSPITAL_COMMUNITY)
Admit: 2019-10-15 | Discharge: 2019-10-15 | Disposition: A | Discharge status: Home / Self Care | Payer: Medicaid Other | Attending: Nurse Practitioner | Admitting: Nurse Practitioner

## 2019-10-15 DIAGNOSIS — Z1623 Resistance to quinolones and fluoroquinolones: Secondary | ICD-10-CM | POA: Diagnosis present

## 2019-10-15 DIAGNOSIS — Z841 Family history of disorders of kidney and ureter: Secondary | ICD-10-CM | POA: Diagnosis not present

## 2019-10-15 DIAGNOSIS — B9620 Unspecified Escherichia coli [E. coli] as the cause of diseases classified elsewhere: Secondary | ICD-10-CM | POA: Diagnosis present

## 2019-10-15 DIAGNOSIS — G9341 Metabolic encephalopathy: Secondary | ICD-10-CM | POA: Diagnosis present

## 2019-10-15 DIAGNOSIS — Z20822 Contact with and (suspected) exposure to covid-19: Secondary | ICD-10-CM | POA: Diagnosis present

## 2019-10-15 DIAGNOSIS — F17210 Nicotine dependence, cigarettes, uncomplicated: Secondary | ICD-10-CM | POA: Diagnosis present

## 2019-10-15 DIAGNOSIS — N201 Calculus of ureter: Secondary | ICD-10-CM | POA: Diagnosis not present

## 2019-10-15 DIAGNOSIS — Z886 Allergy status to analgesic agent status: Secondary | ICD-10-CM | POA: Diagnosis not present

## 2019-10-15 DIAGNOSIS — R109 Unspecified abdominal pain: Secondary | ICD-10-CM | POA: Diagnosis present

## 2019-10-15 DIAGNOSIS — Z91048 Other nonmedicinal substance allergy status: Secondary | ICD-10-CM | POA: Diagnosis not present

## 2019-10-15 DIAGNOSIS — R0902 Hypoxemia: Secondary | ICD-10-CM | POA: Diagnosis not present

## 2019-10-15 DIAGNOSIS — N10 Acute tubulo-interstitial nephritis: Secondary | ICD-10-CM | POA: Diagnosis not present

## 2019-10-15 DIAGNOSIS — R6520 Severe sepsis without septic shock: Secondary | ICD-10-CM | POA: Diagnosis not present

## 2019-10-15 DIAGNOSIS — J449 Chronic obstructive pulmonary disease, unspecified: Secondary | ICD-10-CM | POA: Diagnosis not present

## 2019-10-15 DIAGNOSIS — J432 Centrilobular emphysema: Secondary | ICD-10-CM | POA: Diagnosis present

## 2019-10-15 DIAGNOSIS — R9431 Abnormal electrocardiogram [ECG] [EKG]: Secondary | ICD-10-CM

## 2019-10-15 DIAGNOSIS — N136 Pyonephrosis: Secondary | ICD-10-CM | POA: Diagnosis present

## 2019-10-15 DIAGNOSIS — Z880 Allergy status to penicillin: Secondary | ICD-10-CM | POA: Diagnosis not present

## 2019-10-15 DIAGNOSIS — I248 Other forms of acute ischemic heart disease: Secondary | ICD-10-CM | POA: Diagnosis present

## 2019-10-15 DIAGNOSIS — F10231 Alcohol dependence with withdrawal delirium: Secondary | ICD-10-CM | POA: Diagnosis present

## 2019-10-15 DIAGNOSIS — Z885 Allergy status to narcotic agent status: Secondary | ICD-10-CM | POA: Diagnosis not present

## 2019-10-15 DIAGNOSIS — Z800 Family history of malignant neoplasm of digestive organs: Secondary | ICD-10-CM | POA: Diagnosis not present

## 2019-10-15 DIAGNOSIS — A4151 Sepsis due to Escherichia coli [E. coli]: Secondary | ICD-10-CM | POA: Diagnosis not present

## 2019-10-15 DIAGNOSIS — N200 Calculus of kidney: Secondary | ICD-10-CM | POA: Diagnosis not present

## 2019-10-15 DIAGNOSIS — R0600 Dyspnea, unspecified: Secondary | ICD-10-CM | POA: Diagnosis not present

## 2019-10-15 DIAGNOSIS — F419 Anxiety disorder, unspecified: Secondary | ICD-10-CM | POA: Diagnosis present

## 2019-10-15 DIAGNOSIS — Z881 Allergy status to other antibiotic agents status: Secondary | ICD-10-CM | POA: Diagnosis not present

## 2019-10-15 LAB — CBC WITH DIFFERENTIAL/PLATELET
Abs Immature Granulocytes: 0.22 10*3/uL — ABNORMAL HIGH (ref 0.00–0.07)
Basophils Absolute: 0.1 10*3/uL (ref 0.0–0.1)
Basophils Relative: 1 %
Eosinophils Absolute: 0 10*3/uL (ref 0.0–0.5)
Eosinophils Relative: 0 %
HCT: 36.2 % (ref 36.0–46.0)
Hemoglobin: 12 g/dL (ref 12.0–15.0)
Immature Granulocytes: 4 %
Lymphocytes Relative: 4 %
Lymphs Abs: 0.3 10*3/uL — ABNORMAL LOW (ref 0.7–4.0)
MCH: 30.8 pg (ref 26.0–34.0)
MCHC: 33.1 g/dL (ref 30.0–36.0)
MCV: 93.1 fL (ref 80.0–100.0)
Monocytes Absolute: 0.2 10*3/uL (ref 0.1–1.0)
Monocytes Relative: 2 %
Neutro Abs: 5.6 10*3/uL (ref 1.7–7.7)
Neutrophils Relative %: 89 %
Platelets: 121 10*3/uL — ABNORMAL LOW (ref 150–400)
RBC: 3.89 MIL/uL (ref 3.87–5.11)
RDW: 13.8 % (ref 11.5–15.5)
WBC: 6.3 10*3/uL (ref 4.0–10.5)
nRBC: 0 % (ref 0.0–0.2)

## 2019-10-15 LAB — COMPREHENSIVE METABOLIC PANEL
ALT: 39 U/L (ref 0–44)
AST: 40 U/L (ref 15–41)
Albumin: 3.7 g/dL (ref 3.5–5.0)
Alkaline Phosphatase: 115 U/L (ref 38–126)
Anion gap: 13 (ref 5–15)
BUN: 10 mg/dL (ref 6–20)
CO2: 23 mmol/L (ref 22–32)
Calcium: 8.8 mg/dL — ABNORMAL LOW (ref 8.9–10.3)
Chloride: 97 mmol/L — ABNORMAL LOW (ref 98–111)
Creatinine, Ser: 0.98 mg/dL (ref 0.44–1.00)
GFR calc Af Amer: >60 mL/min (ref >60)
GFR calc non Af Amer: >60 mL/min (ref >60)
Glucose, Bld: 135 mg/dL — ABNORMAL HIGH (ref 70–99)
Potassium: 3.4 mmol/L — ABNORMAL LOW (ref 3.5–5.1)
Sodium: 133 mmol/L — ABNORMAL LOW (ref 135–145)
Total Bilirubin: 1.3 mg/dL — ABNORMAL HIGH (ref 0.3–1.2)
Total Protein: 6.9 g/dL (ref 6.5–8.1)

## 2019-10-15 LAB — ECHOCARDIOGRAM COMPLETE
Height: 62 in
Weight: 2222.24 oz

## 2019-10-15 LAB — URINE CULTURE: Culture: >=100000 — AB

## 2019-10-15 LAB — TROPONIN I (HIGH SENSITIVITY): Troponin I (High Sensitivity): 95 ng/L — ABNORMAL HIGH (ref <18)

## 2019-10-15 LAB — LACTIC ACID, PLASMA
Lactic Acid, Venous: 1.2 mmol/L (ref 0.5–1.9)
Lactic Acid, Venous: 1.4 mmol/L (ref 0.5–1.9)

## 2019-10-15 MED ORDER — OXYBUTYNIN CHLORIDE 5 MG PO TABS
10.0000 mg | ORAL_TABLET | Freq: Four times a day (QID) | ORAL | Status: DC
  Administered 2019-10-15 – 2019-10-20 (×19): 10 mg via ORAL
  Filled 2019-10-15 (×22): qty 2

## 2019-10-15 MED ORDER — VANCOMYCIN HCL IN DEXTROSE 1-5 GM/200ML-% IV SOLN
1000.0000 mg | INTRAVENOUS | Status: DC
  Administered 2019-10-16: 1000 mg via INTRAVENOUS
  Filled 2019-10-15: qty 200

## 2019-10-15 MED ORDER — DIAZEPAM 5 MG PO TABS
5.0000 mg | ORAL_TABLET | Freq: Every evening | ORAL | Status: DC | PRN
  Administered 2019-10-15: 5 mg via ORAL
  Filled 2019-10-15: qty 1

## 2019-10-15 MED ORDER — SODIUM CHLORIDE 0.9 % IV SOLN
2.0000 g | Freq: Two times a day (BID) | INTRAVENOUS | Status: DC
Start: 2019-10-15 — End: 2019-10-16
  Administered 2019-10-15 – 2019-10-16 (×2): 2 g via INTRAVENOUS
  Filled 2019-10-15 (×4): qty 2

## 2019-10-15 MED ORDER — IPRATROPIUM-ALBUTEROL 0.5-2.5 (3) MG/3ML IN SOLN
3.0000 mL | RESPIRATORY_TRACT | Status: DC
  Administered 2019-10-15 – 2019-10-18 (×12): 3 mL via RESPIRATORY_TRACT
  Filled 2019-10-15 (×14): qty 3

## 2019-10-15 MED ORDER — POTASSIUM CHLORIDE 20 MEQ PO PACK
40.0000 meq | PACK | Freq: Once | ORAL | Status: AC
  Administered 2019-10-15: 40 meq via ORAL
  Filled 2019-10-15: qty 2

## 2019-10-15 MED ORDER — HYDROMORPHONE HCL 1 MG/ML IJ SOLN
1.0000 mg | INTRAMUSCULAR | Status: DC
  Administered 2019-10-15: 08:00:00 1 mg via INTRAVENOUS

## 2019-10-15 MED ORDER — HYDROMORPHONE HCL 1 MG/ML IJ SOLN
1.0000 mg | INTRAMUSCULAR | Status: DC | PRN
  Administered 2019-10-15 – 2019-10-16 (×3): 1 mg via INTRAVENOUS
  Filled 2019-10-15 (×3): qty 1

## 2019-10-15 MED ORDER — HYDROMORPHONE HCL 1 MG/ML IJ SOLN
1.0000 mg | INTRAMUSCULAR | Status: DC | PRN
  Administered 2019-10-15 (×3): 1 mg via INTRAVENOUS
  Filled 2019-10-15 (×3): qty 1

## 2019-10-15 MED ORDER — HYDROMORPHONE HCL 1 MG/ML IJ SOLN
1.0000 mg | INTRAMUSCULAR | Status: DC | PRN
  Administered 2019-10-15 (×2): 1 mg via INTRAVENOUS
  Filled 2019-10-15 (×3): qty 1

## 2019-10-15 MED ORDER — DIPHENHYDRAMINE HCL 50 MG/ML IJ SOLN
25.0000 mg | Freq: Once | INTRAMUSCULAR | Status: AC
  Administered 2019-10-15: 25 mg via INTRAVENOUS

## 2019-10-15 MED ORDER — METRONIDAZOLE IN NACL 5-0.79 MG/ML-% IV SOLN
500.0000 mg | Freq: Three times a day (TID) | INTRAVENOUS | Status: DC
  Administered 2019-10-15 – 2019-10-16 (×3): 500 mg via INTRAVENOUS
  Filled 2019-10-15 (×7): qty 100

## 2019-10-15 MED ORDER — SODIUM CHLORIDE 0.9 % IV SOLN
2.0000 g | Freq: Three times a day (TID) | INTRAVENOUS | Status: DC
  Administered 2019-10-15: 2 g via INTRAVENOUS
  Filled 2019-10-15 (×3): qty 2

## 2019-10-15 MED ORDER — METHYLPREDNISOLONE SODIUM SUCC 40 MG IJ SOLR: 40.0000 mg | Freq: Every day | INTRAMUSCULAR | Status: DC

## 2019-10-15 MED ORDER — LACTATED RINGERS IV BOLUS (SEPSIS)
500.0000 mL | Freq: Once | INTRAVENOUS | Status: AC
Start: 2019-10-15 — End: 2019-10-15
  Administered 2019-10-15: 500 mL via INTRAVENOUS

## 2019-10-15 MED ORDER — LACTATED RINGERS IV SOLN: INTRAVENOUS | Status: AC

## 2019-10-15 MED ORDER — LACTATED RINGERS IV BOLUS (SEPSIS)
500.0000 mL | Freq: Once | INTRAVENOUS | Status: AC
  Administered 2019-10-15: 500 mL via INTRAVENOUS

## 2019-10-15 MED ORDER — CHLORHEXIDINE GLUCONATE CLOTH 2 % EX PADS
6.0000 | MEDICATED_PAD | Freq: Every day | CUTANEOUS | Status: DC
  Administered 2019-10-16 – 2019-10-17 (×2): 6 via TOPICAL

## 2019-10-15 MED ORDER — HYDROMORPHONE HCL 1 MG/ML IJ SOLN
INTRAMUSCULAR | Status: AC
  Administered 2019-10-15: 1 mg
  Filled 2019-10-15: qty 1

## 2019-10-15 MED ORDER — VANCOMYCIN HCL 1250 MG/250ML IV SOLN
1250.0000 mg | Freq: Once | INTRAVENOUS | Status: AC
  Administered 2019-10-15: 1250 mg via INTRAVENOUS
  Filled 2019-10-15: qty 250

## 2019-10-15 MED ORDER — OXYCODONE HCL 5 MG PO TABS
10.0000 mg | ORAL_TABLET | ORAL | Status: DC | PRN
  Administered 2019-10-15 – 2019-10-20 (×19): 10 mg via ORAL
  Filled 2019-10-15 (×19): qty 2

## 2019-10-15 MED ORDER — METOPROLOL TARTRATE 5 MG/5ML IV SOLN
5.0000 mg | Freq: Once | INTRAVENOUS | Status: AC
  Administered 2019-10-15: 5 mg via INTRAVENOUS
  Filled 2019-10-15: qty 5

## 2019-10-15 MED ORDER — POTASSIUM CHLORIDE CRYS ER 20 MEQ PO TBCR
40.0000 meq | EXTENDED_RELEASE_TABLET | Freq: Once | ORAL | Status: DC
  Filled 2019-10-15: qty 2

## 2019-10-15 NOTE — Progress Notes (Signed)
Patient admitted for ureteral stone s/p ureteral stent. Patient had EKG done for tachycardia found to have QTc of 607 on EKG w/ non-specific T-wave abnormality. Current K is 4.2, no mag drawn.   Will d/c zofran IV for reduction of QTc prolongation and will continue to monitor.  Thomasene Ripple, PharmD, BCPS Clinical Pharmacist 10/15/2019

## 2019-10-15 NOTE — Progress Notes (Signed)
CSW acknowledges consult due to patient reporting difficulty with obtaining medications. Patient has Medicaid therefore her medications should be $3.  Alfonso Ramus, LCSW [phone]

## 2019-10-15 NOTE — Progress Notes (Signed)
PROGRESS NOTE    [name]  NFA:[id number] DOB: [birth year] DOA: 10/13/2019 PCP: Teresa Gang, FNP    Assessment & Plan:   Active Problems:   Left ureteral stone   Dyspnea on exertion   Hypoxia   Tachycardia    Teresa Davenport is an 54 y.o. Caucasian female with a history of nephrolithiasis, admitted to the urology service on 10/13/2019, with pain from obstructive calculus, postop day 1 of left ureteral stent placement with noted pyelonephrosis during the procedure, and kept inpatient for antibiotic treatment, now on Rocephin and Cipro.  Patient continues to have flank pain following her procedure. She has been having shortness of breath with minimal exertion.  She continues to complain of severe left flank pain with minimal relief with as needed oxycodone and Dilaudid  Severe left flank pain 2/2 Left ureteral lithiasis and severe left hydronephrosis and hydroureter S/p stent placement 10/13/2019 nonobstructive calculi in the kidneys bilaterally -Patient continues to have severe left flank pain  PLAN: --continue flomax -Continue IV hydration --Will need to defer definitive stone treatment until at least next week with active infection --Oxycodone 10 mg q4h PRN, IV dilaudid 1 mg q3h PRN for breakthrough  Possible UTI WBC 14.2, recent fever, possible UTI, recent instrumentation, tachycardia -Urinalysis on 10/12/2019 was negative PLAN: -continue vanc/cefepime/flagyl for now -Continue IV hydration  Dyspnea on exertion   Hypoxia Hx of COPD, does not appear to be in exacerbation -Suspect primarily related to splinting from persistent severe left flank pain -CTA neg for PE PLAN: -Continue oxygen supplementation, goal 88-92% --DuoNeb scheduled  Anxiety --Valium nightly PRN   DVT prophylaxis: SCD/Compression stockings Code Status: Full code  Family Communication:  Disposition Plan: Home, unclear when   Subjective and Interval History:  Transferred to  stepdown overnight.  Pt complained of severe pain in her left flank and across her lower abdomen.  Denied dyspnea.  Complained of anxiety.   Objective: Vitals:   10/15/19 1438 10/15/19 1600 10/15/19 1700 10/15/19 1800  BP: 97/61 (!) 89/49 (!) 107/51 101/66  Pulse: (!) 113  (!) 120 (!) 109  Resp: 20 18 (!) 28 19  Temp:    99.6 F (37.6 C)  TempSrc:      SpO2: (!) 88% 92% 90% 100%  Weight:      Height:        Intake/Output Summary (Last 24 hours) at 10/15/2019 2011 Last data filed at 10/15/2019 1700 Gross per 24 hour  Intake 129.55 ml  Output 800 ml  Net -670.45 ml   Filed Weights   10/13/19 0530  Weight: 63 kg    Examination:   Constitutional: NAD, AAOx3, frustrated  HEENT: conjunctivae and lids normal, EOMI CV: RRR no M,R,G. Distal pulses +2.  No cyanosis.   RESP: CTA B/L, normal respiratory effort, variable O2 sat due to pt holding her breaths  GI: +BS, NTND Extremities: No effusions, edema, or tenderness in BLE SKIN: warm, dry and intact Neuro: II - XII grossly intact.  Sensation intact   Data Reviewed: I have personally reviewed following labs and imaging studies  CBC: Recent Labs  Lab 10/12/19 1408 10/14/19 1042 10/14/19 2116 10/15/19 0044  WBC 9.6 14.2* 9.4 6.3  NEUTROABS 8.6*  --   --  5.6  HGB 14.4 11.4* 12.3 12.0  HCT 43.7 35.0* 38.7 36.2  MCV 93.4 94.9 97.7 93.1  PLT 254 138* 121* 121*   Basic Metabolic Panel: Recent Labs  Lab 10/12/19 1408 10/14/19 1042 10/14/19 2116 10/15/19 0044  NA 139 136 135 133*  K 3.7 4.1 4.2 3.4*  CL 102 104 101 97*  CO2 25 25 24 23   GLUCOSE 129* 116* 118* 135*  BUN 15 14 10 10   CREATININE 0.88 0.77 0.87 0.98  CALCIUM 9.3 8.8* 8.7* 8.8*   GFR: Estimated Creatinine Clearance: 58 mL/min (by C-G formula based on SCr of 0.98 mg/dL). Liver Function Tests: Recent Labs  Lab 10/12/19 1408 10/15/19 0044  AST 40 40  ALT 29 39  ALKPHOS 83 115  BILITOT 0.8 1.3*  PROT 7.7 6.9  ALBUMIN 4.4 3.7   Recent Labs    Lab 10/12/19 1408  LIPASE 24   No results for input(s): AMMONIA in the last 168 hours. Coagulation Profile: Recent Labs  Lab 10/14/19 2116  INR 1.0   Cardiac Enzymes: No results for input(s): CKTOTAL, CKMB, CKMBINDEX, TROPONINI in the last 168 hours. BNP (last 3 results) No results for input(s): PROBNP in the last 8760 hours. HbA1C: No results for input(s): HGBA1C in the last 72 hours. CBG: No results for input(s): GLUCAP in the last 168 hours. Lipid Profile: No results for input(s): CHOL, HDL, LDLCALC, TRIG, CHOLHDL, LDLDIRECT in the last 72 hours. Thyroid Function Tests: Recent Labs    10/14/19 2116  TSH 1.801   Anemia Panel: No results for input(s): VITAMINB12, FOLATE, FERRITIN, TIBC, IRON, RETICCTPCT in the last 72 hours. Sepsis Labs: Recent Labs  Lab 10/14/19 2116 10/15/19 0044 10/15/19 0842  LATICACIDVEN 1.3 1.2 1.4    Recent Results (from the past 240 hour(s))  Anaerobic culture     Status: None (Preliminary result)   Collection Time: 10/13/19  5:25 AM   Specimen: PATH Other; Tissue  Result Value Ref Range Status   Specimen Description   Final    URINE, RANDOM Performed at [facility], [address] [place], [place] [address]    Special Requests   Final    NONE Performed at [facility], [address], [place], [place] [address]    Culture   Final    NO ANAEROBES ISOLATED; CULTURE IN PROGRESS FOR 5 DAYS   Report Status PENDING  Incomplete  Urine Culture     Status: Abnormal   Collection Time: 10/13/19  5:25 AM   Specimen: Urine, Random  Result Value Ref Range Status   Specimen Description URINE, RANDOM  Final   Special Requests NONE  Final   Culture >=100,000 COLONIES/mL ESCHERICHIA COLI (A)  Final   Report Status 10/15/2019 FINAL  Final   Organism ID, Bacteria ESCHERICHIA COLI (A)  Final      Susceptibility   Escherichia coli - MIC*    AMPICILLIN >=32 RESISTANT Resistant     CEFAZOLIN <=4 SENSITIVE Sensitive      CEFTRIAXONE <=0.25 SENSITIVE Sensitive     CIPROFLOXACIN >=4 RESISTANT Resistant     GENTAMICIN <=1 SENSITIVE Sensitive     IMIPENEM <=0.25 SENSITIVE Sensitive     NITROFURANTOIN 64 INTERMEDIATE Intermediate     TRIMETH/SULFA >=320 RESISTANT Resistant     AMPICILLIN/SULBACTAM >=32 RESISTANT Resistant     PIP/TAZO <=4 SENSITIVE Sensitive     * >=100,000 COLONIES/mL ESCHERICHIA COLI  Respiratory Panel by RT PCR (Flu A&B, Covid) - Nasopharyngeal Swab     Status: None   Collection Time: 10/13/19  6:33 AM   Specimen: Nasopharyngeal Swab  Result Value Ref Range Status   SARS Coronavirus 2 by RT PCR NEGATIVE NEGATIVE Final    Comment: (NOTE) SARS-CoV-2 target nucleic acids are NOT  DETECTED. The SARS-CoV-2 RNA is generally detectable in upper respiratoy specimens during the acute phase of infection. The lowest concentration of SARS-CoV-2 viral copies this assay can detect is 131 copies/mL. A negative result does not preclude SARS-Cov-2 infection and should not be used as the sole basis for treatment or other patient management decisions. A negative result may occur with  improper specimen collection/handling, submission of specimen other than nasopharyngeal swab, presence of viral mutation(s) within the areas targeted by this assay, and inadequate number of viral copies (<131 copies/mL). A negative result must be combined with clinical observations, patient history, and epidemiological information. The expected result is Negative. Fact Sheet for Patients:  https://www.moore.com/ Fact Sheet for Healthcare Providers:  https://www.young.biz/ This test is not yet ap proved or cleared by the [place] FDA and  has been authorized for detection and/or diagnosis of SARS-CoV-2 by FDA under an Emergency Use Authorization (EUA). This EUA will remain  in effect (meaning this test can be used) for the duration of the COVID-19 declaration under Section  564(b)(1) of the Act, 21 U.S.C. section 360bbb-3(b)(1), unless the authorization is terminated or revoked sooner.    Influenza A by PCR NEGATIVE NEGATIVE Final   Influenza B by PCR NEGATIVE NEGATIVE Final    Comment: (NOTE) The Xpert Xpress SARS-CoV-2/FLU/RSV assay is intended as an aid in  the diagnosis of influenza from Nasopharyngeal swab specimens and  should not be used as a sole basis for treatment. Nasal washings and  aspirates are unacceptable for Xpert Xpress SARS-CoV-2/FLU/RSV  testing. Fact Sheet for Patients: https://www.moore.com/ Fact Sheet for Healthcare Providers: https://www.young.biz/ This test is not yet approved or cleared by the [place] FDA and  has been authorized for detection and/or diagnosis of SARS-CoV-2 by  FDA under an Emergency Use Authorization (EUA). This EUA will remain  in effect (meaning this test can be used) for the duration of the  Covid-19 declaration under Section 564(b)(1) of the Act, 21  U.S.C. section 360bbb-3(b)(1), unless the authorization is  terminated or revoked. Performed at [facility], [address] [place], [place] [address]   CULTURE, BLOOD (ROUTINE X 2) w Reflex to ID Panel     Status: None (Preliminary result)   Collection Time: 10/15/19 12:44 AM   Specimen: BLOOD  Result Value Ref Range Status   Specimen Description BLOOD BLOOD RIGHT HAND  Final   Special Requests   Final    BOTTLES DRAWN AEROBIC AND ANAEROBIC Blood Culture adequate volume   Culture   Final    NO GROWTH < 12 HOURS Performed at [facility], [address] [place], [place] [address]    Report Status PENDING  Incomplete  CULTURE, BLOOD (ROUTINE X 2) w Reflex to ID Panel     Status: None (Preliminary result)   Collection Time: 10/15/19 12:56 AM   Specimen: BLOOD  Result Value Ref Range Status   Specimen Description BLOOD LEFT ANTECUBITAL  Final   Special Requests   Final    BOTTLES DRAWN  AEROBIC AND ANAEROBIC Blood Culture adequate volume   Culture   Final    NO GROWTH < 12 HOURS Performed at [facility], [address] [place], [place] [address]    Report Status PENDING  Incomplete      Radiology Studies: DG Abd 1 View  Result Date: 10/14/2019 CLINICAL DATA:  Flank pain EXAM: ABDOMEN - 1 VIEW COMPARISON:  CT abdomen 10/12/2019 FINDINGS: There is no bowel dilatation to suggest obstruction. There is  no evidence of pneumoperitoneum, portal venous gas or pneumatosis. Left nephroureteral stent. 17 mm left lower pole renal calculus. 6 mm distal left ureteral calculus. The osseous structures are unremarkable. IMPRESSION: Left nephroureteral stent in satisfactory position. 17 mm left lower pole renal calculus. 6 mm distal left ureteral calculus. Electronically Signed   By: Elige Ko   On: 10/14/2019 14:05   CT ANGIO CHEST PE W OR WO CONTRAST  Result Date: 10/14/2019 CLINICAL DATA:  54 year old female with shortness of breath. EXAM: CT ANGIOGRAPHY CHEST WITH CONTRAST TECHNIQUE: Multidetector CT imaging of the chest was performed using the standard protocol during bolus administration of intravenous contrast. Multiplanar CT image reconstructions and MIPs were obtained to evaluate the vascular anatomy. CONTRAST:  75mL OMNIPAQUE IOHEXOL 350 MG/ML SOLN COMPARISON:  Portable chest earlier today. CTA chest 08/25/2018. FINDINGS: Cardiovascular: Adequate contrast bolus timing in the pulmonary arterial tree. Mild respiratory motion. No focal filling defect identified in the pulmonary arteries to suggest acute pulmonary embolism. No cardiomegaly or pericardial effusion. Negative thoracic aorta. Mild calcified plaque in the visible proximal abdominal aorta. Mildly tortuous proximal great vessels. Mediastinum/Nodes: No lymphadenopathy. Lungs/Pleura: Major airways are patent. Lower lung volumes with chronic centrilobular and paraseptal emphysema. Confluent but enhancing left lung base  atelectasis with superimposed trace new layering left pleural effusion. Right costophrenic angle opacity also most resembles atelectasis. No right pleural effusion. Upper Abdomen: Grossly normal visible upper abdominal viscera, early contrast timing suspected in the spleen. No upper abdominal free fluid. Musculoskeletal: No acute osseous abnormality identified. Chronic postoperative changes to left lateral ribs with ORIF hardware. Midthoracic calcified ligament flavum hypertrophy. Review of the MIP images confirms the above findings. IMPRESSION: 1. Negative for acute pulmonary embolus. 2. Emphysema (ICD10-J43.9) with lower lung volumes. Left lung base pneumonia is difficult to exclude, although lung enhancement there favors atelectasis. However, there is also a new trace left pleural effusion. Electronically Signed   By: Odessa Fleming M.D.   On: 10/14/2019 23:03   DG Chest Port 1 View  Result Date: 10/14/2019 CLINICAL DATA:  Tachycardia, fever, hard time urinating EXAM: PORTABLE CHEST 1 VIEW COMPARISON:  Portable exam 2052 hours compared to 08/25/2018 FINDINGS: Upper normal heart size. Mediastinal contours normal. Scattered interstitial infiltrates appear increased since previous exam suspected combination of underlying chronic interstitial disease with superimposed infection or edema. Underlying emphysematous changes and questionable tiny LEFT pleural effusion. No pneumothorax. Bones demineralized with prior plating of lateral LEFT fourth and fifth ribs. BILATERAL breast prostheses. LEFT ureteral stent identified. IMPRESSION: Changes of COPD and chronic interstitial lung disease with superimposed interstitial infiltrates which could represent edema or infection. Electronically Signed   By: Ulyses Southward M.D.   On: 10/14/2019 21:01   DG OR UROLOGY CYSTO IMAGE ([facility] ONLY)  Result Date: 10/14/2019 There is no interpretation for this exam.  This order is for images obtained during a surgical procedure.  Please See  "Surgeries" Tab for more information regarding the procedure.   ECHOCARDIOGRAM COMPLETE  Result Date: 10/15/2019    ECHOCARDIOGRAM REPORT   Patient Name:   Teresa Davenport Date of Exam: 10/15/2019 Medical Rec #:  [id number]            Height:       62.0 in Accession #:    [id number]           Weight:       138.9 lb Date of Birth:  22-May-1966            BSA:  1.637 m Patient Age:    53 years             BP:           84/60 mmHg Patient Gender: F                    HR:           120 bpm. Exam Location:  [facility] Procedure: 2D Echo Indications:     Abnormal ECG  History:         Patient has no prior history of Echocardiogram examinations.                  Risk Factors:Current Smoker.  Sonographer:     Enrique SackKendra Thornton-Maynard Referring Phys:  [id number] Hubbard HartshornELIZABETH ACHIENG OUMA Diagnosing Phys: Yvonne Kendallhristopher End MD  Sonographer Comments: Image acquisition challenging due to breast implants. IMPRESSIONS  1. Left ventricular ejection fraction, by estimation, is 65 to 70%. The left ventricle has normal function. The left ventricle has no regional wall motion abnormalities. Left ventricular diastolic parameters were normal.  2. Right ventricular systolic function is normal. The right ventricular size is normal. There is normal pulmonary artery systolic pressure.  3. The mitral valve is grossly normal. Trivial mitral valve regurgitation. No evidence of mitral stenosis.  4. The aortic valve was not well visualized. Aortic valve regurgitation is not visualized. No aortic stenosis is present.  5. The inferior vena cava is normal in size with greater than 50% respiratory variability, suggesting right atrial pressure of 3 mmHg. FINDINGS  Left Ventricle: Left ventricular ejection fraction, by estimation, is 65 to 70%. The left ventricle has normal function. The left ventricle has no regional wall motion abnormalities. The left ventricular internal cavity size was normal in size. There is  no left ventricular hypertrophy. Left  ventricular diastolic parameters were normal. Right Ventricle: The right ventricular size is normal. No increase in right ventricular wall thickness. Right ventricular systolic function is normal. There is normal pulmonary artery systolic pressure. The tricuspid regurgitant velocity is 2.82 m/s, and  with an assumed right atrial pressure of 3 mmHg, the estimated right ventricular systolic pressure is 34.8 mmHg. Left Atrium: Left atrial size was normal in size. Right Atrium: Right atrial size was normal in size. Pericardium: There is no evidence of pericardial effusion. Mitral Valve: The mitral valve is grossly normal. Trivial mitral valve regurgitation. No evidence of mitral valve stenosis. MV peak gradient, 6.1 mmHg. The mean mitral valve gradient is 4.0 mmHg. Tricuspid Valve: The tricuspid valve is not well visualized. Tricuspid valve regurgitation is trivial. Aortic Valve: The aortic valve was not well visualized. Aortic valve regurgitation is not visualized. No aortic stenosis is present. Aortic valve mean gradient measures 5.0 mmHg. Aortic valve peak gradient measures 8.5 mmHg. Aortic valve area, by VTI measures 1.66 cm. Pulmonic Valve: The pulmonic valve was not well visualized. Pulmonic valve regurgitation is not visualized. No evidence of pulmonic stenosis. Aorta: The aortic root is normal in size and structure. Pulmonary Artery: The pulmonary artery is not well seen. Venous: The inferior vena cava is normal in size with greater than 50% respiratory variability, suggesting right atrial pressure of 3 mmHg. IAS/Shunts: The interatrial septum was not well visualized.  LEFT VENTRICLE PLAX 2D LVIDd:         3.74 cm  Diastology LVIDs:         2.61 cm  LV e' lateral:   16.80 cm/s LV PW:  0.86 cm  LV E/e' lateral: 6.5 LV IVS:        0.81 cm  LV e' medial:    13.60 cm/s LVOT diam:     1.70 cm  LV E/e' medial:  8.1 LV SV:         42 LV SV Index:   26 LVOT Area:     2.27 cm  RIGHT VENTRICLE RV S prime:      10.60 cm/s TAPSE (M-mode): 2.4 cm LEFT ATRIUM             Index       RIGHT ATRIUM           Index LA diam:        2.60 cm 1.59 cm/m  RA Area:     15.40 cm LA Vol (A2C):   38.4 ml 23.42 ml/m RA Volume:   38.30 ml  23.39 ml/m LA Vol (A4C):   34.3 ml 20.95 ml/m LA Biplane Vol: 40.3 ml 24.61 ml/m  AORTIC VALVE AV Area (Vmax):    1.74 cm AV Area (Vmean):   1.58 cm AV Area (VTI):     1.66 cm AV Vmax:           146.00 cm/s AV Vmean:          106.000 cm/s AV VTI:            0.252 m AV Peak Grad:      8.5 mmHg AV Mean Grad:      5.0 mmHg LVOT Vmax:         112.00 cm/s LVOT Vmean:        73.800 cm/s LVOT VTI:          0.184 m LVOT/AV VTI ratio: 0.73  AORTA Ao Root diam: 3.10 cm MITRAL VALVE                TRICUSPID VALVE MV Area (PHT): 4.78 cm     TR Peak grad:   31.8 mmHg MV Peak grad:  6.1 mmHg     TR Vmax:        282.00 cm/s MV Mean grad:  4.0 mmHg MV Vmax:       1.23 m/s     SHUNTS MV Vmean:      95.2 cm/s    Systemic VTI:  0.18 m MV E velocity: 109.50 cm/s  Systemic Diam: 1.70 cm MV A velocity: 88.05 cm/s MV E/A ratio:  1.24 Cristal Deer End MD Electronically signed by Yvonne Kendall MD Signature Date/Time: 10/15/2019/2:43:03 PM    Final      Scheduled Meds: . Chlorhexidine Gluconate Cloth  6 each Topical Daily  . ipratropium-albuterol  3 mL Nebulization Q4H  . oxybutynin  10 mg Oral QID  . tamsulosin  0.4 mg Oral Daily   Continuous Infusions: . ceFEPime (MAXIPIME) IV Stopped (10/15/19 1515)  . lactated ringers 100 mL/hr at 10/15/19 1842  . metronidazole 100 mL/hr at 10/15/19 1700  . [START ON 10/16/2019] vancomycin       LOS: 0 days     Darlin Priestly, MD [facility] If 7PM-7AM, please contact night-coverage 10/15/2019, 8:11 PM

## 2019-10-15 NOTE — Progress Notes (Signed)
Patient states that the pain interventions today, increasing dilaudid and oxycodone, have helped only slightly. Still very uncomfortable when awake, but patient is able to fall asleep and rest at times. Patient is still tachypneic and needs supplemental O2 support, which she is agreeable to PRN. Still very anxious and frustrated with her current situation. Agrees to continue working with the plan of care and hopes pain relief will come soon.

## 2019-10-15 NOTE — Progress Notes (Signed)
Patient in distress moved to ICU-12.

## 2019-10-15 NOTE — Progress Notes (Signed)
    BRIEF OVERNIGHT PROGRESS REPORT   SUBJECTIVE: Rapid response initiated by primary nurse due to tachycardia, tachypneic, febrile, and questionable confusion concerning for possible sepsis.  Patient also complaining of severe flank pain not relieved with current IV pain medication.  OBJECTIVE :On arrival to the bedside, She was afebrile (temp 103.1) with blood pressure 135/60 mm Hg and pulse rate 160 beats/min. There were no focal neurological deficits; he was alert and oriented x4, and he did not demonstrate any memory deficits.   BRIEF PATIENT DESCRIPTION: 54 year old female with past medical history of left distal ureteral stone with hydronephrosis and intractable pain, pyelonephritis, renal colic, TBI, kidney stone, and pancreatitis presenting to the ED with chief complaints of flank pain that radiates to her groin.  CT shows a 5 mm left distal stone and a 1.6 LLP stone with marked hydronephrosis s/p cystoscopy with a left RPG, left URS with HLL and stenting.  ASSESSMENT/PLAN: 1. Sepsis - Patient meets SIRS criteria,Heart Rate 160 beats/minute, Respiratory Rate 26 breaths/minute,Temperature 103.1, leukocytosis WBC 14.2 -Transfer to stepdown - Suspect UTI as source of infection with recent stent placement - Also Questionable pneumonia on CT chest and chest x-ray? - Check labs :Lactic acid, CBC, CMP, procalcitonin - Urine cultures 4/4 with E. coli pending susceptibility - Blood cultures pending - DC ceftriaxone and broaden coverage with vancomycin and cefepime pending cultures as above. - IVFs and PRN bolus to keep MAP<32mmHg or SBP <88mmHg - Urology following  2. QTc prolongation - EKG done for tachycardia found to have QTC of 609 on EKG with nonspecific T wave abnormality. - Monitor electrolytes and replace as appropriate - Avoid QTc prolonging medication.  3. Elevated troponin -likely demand ischemia in the setting of tachycardia - EKG shows as above - Continue to trend  troponin  See complete consult note from hospitalist   Webb Silversmith, DNP, CCRN, FNP-C [facility] Nurse Practitioner

## 2019-10-15 NOTE — Telephone Encounter (Signed)
App made pt is still inpatient will contact once she is released   [place]

## 2019-10-15 NOTE — Progress Notes (Signed)
Patient continually takes off supplemental O2 support. Constant reminders given for deep breaths

## 2019-10-15 NOTE — Progress Notes (Signed)
Pharmacy Antibiotic Note  Teresa Davenport is a 54 y.o. female admitted on 10/13/2019 with sepsis.  Pharmacy has been consulted for vanc/cefepime dosing.  Plan: Patient received vanc 1.25g IV load  Vancomycin 1000 mg IV Q 24 hrs. Goal AUC 400-550. Expected AUC: 459.3 SCr used: 0.98 Cssmin: 10.7  Will continue cefepime 2g IV q12h per CrCl 30 - 60 ml/min and will continue to monitor.  Height: 5\' 2"  (157.5 cm) Weight: 63 kg (138 lb 14.2 oz) IBW/kg (Calculated) : 50.1  Temp (24hrs), Avg:100.3 F (37.9 C), Min:97.3 F (36.3 C), Max:103.1 F (39.5 C)  Recent Labs  Lab 10/12/19 1408 10/14/19 1042 10/14/19 2116 10/15/19 0044  WBC 9.6 14.2* 9.4 6.3  CREATININE 0.88 0.77 0.87 0.98  LATICACIDVEN  --   --  1.3 1.2    Estimated Creatinine Clearance: 58 mL/min (by C-G formula based on SCr of 0.98 mg/dL).    Allergies  Allergen Reactions  . Meropenem Shortness Of Breath  . Penicillins Anaphylaxis and Swelling    Has patient had a PCN reaction causing immediate rash, facial/tongue/throat swelling, SOB or lightheadedness with hypotension: Yes Has patient had a PCN reaction causing severe rash involving mucus membranes or skin necrosis: No Has patient had a PCN reaction that required hospitalization No Has patient had a PCN reaction occurring within the last 10 years: No If all of the above answers are "NO", then may proceed with Cephalosporin use.  12/15/19 Doxycycline Swelling  . Motrin [Ibuprofen] Swelling  . Tape     Patient says she cannot tolerate any tape but tegaderm  . Toradol [Ketorolac Tromethamine] Swelling  . Tramadol Swelling  . Amoxicillin Rash  . Ketorolac Rash  . Methadone Rash  . Naproxen Sodium Rash    Thank you for allowing pharmacy to be a part of this patient's care.  Marland Kitchen, PharmD, BCPS Clinical Pharmacist 10/15/2019 9:19 AM

## 2019-10-15 NOTE — Progress Notes (Signed)
Urology Follow Up  Subjective: Hospitalist consultation obtained last night for fever, persistent tachycardia and shortness of breath.  Rapid response called earlier this morning secondary to tachycardia, tachypnea and fever.  Patient transferred to stepdown.  Hospitalist team accepted in transfer.  She continues to have difficult to control left flank pain radiating to the left lower quadrant.  She states her pain is no different after stent placement and states "the stone is causing my pain".  KUB performed yesterday was reviewed and the stent is in good position.  Anti-infectives: Anti-infectives (From admission, onward)   Start     Dose/Rate Route Frequency Ordered Stop   10/16/19 0300  vancomycin (VANCOCIN) IVPB 1000 mg/200 mL premix     1,000 mg 200 mL/hr over 60 Minutes Intravenous Every 24 hours 10/15/19 0918     10/15/19 1400  ceFEPIme (MAXIPIME) 2 g in sodium chloride 0.9 % 100 mL IVPB     2 g 200 mL/hr over 30 Minutes Intravenous Every 12 hours 10/15/19 0922     10/15/19 0930  metroNIDAZOLE (FLAGYL) IVPB 500 mg     500 mg 100 mL/hr over 60 Minutes Intravenous Every 8 hours 10/15/19 0924     10/15/19 0200  ceFEPIme (MAXIPIME) 2 g in sodium chloride 0.9 % 100 mL IVPB  Status:  Discontinued     2 g 200 mL/hr over 30 Minutes Intravenous Every 8 hours 10/15/19 0109 10/15/19 0922   10/15/19 0115  vancomycin (VANCOREADY) IVPB 1250 mg/250 mL     1,250 mg 166.7 mL/hr over 90 Minutes Intravenous  Once 10/15/19 0109 10/15/19 0427   10/14/19 1100  cefTRIAXone (ROCEPHIN) 1 g in sodium chloride 0.9 % 100 mL IVPB  Status:  Discontinued     1 g 200 mL/hr over 30 Minutes Intravenous Every 24 hours 10/14/19 1018 10/15/19 0109   10/13/19 2000  ciprofloxacin (CIPRO) IVPB 400 mg  Status:  Discontinued     400 mg 200 mL/hr over 60 Minutes Intravenous Every 12 hours 10/13/19 1036 10/14/19 1018   10/13/19 0840  ciprofloxacin (CIPRO) 400 MG/200ML IVPB    Note to Pharmacy: Huffines, Mindy   :  cabinet override      10/13/19 0840 10/13/19 0844   10/13/19 0000  ciprofloxacin (CIPRO) 500 MG tablet     500 mg Oral 2 times daily 10/13/19 0912 10/23/19 2359      Current Facility-Administered Medications  Medication Dose Route Frequency Provider Last Rate Last Admin  . acetaminophen (TYLENOL) tablet 650 mg  650 mg Oral Q4H PRN Bjorn Pippin, MD   650 mg at 10/15/19 1027  . bisacodyl (DULCOLAX) suppository 10 mg  10 mg Rectal Daily PRN Bjorn Pippin, MD      . ceFEPIme (MAXIPIME) 2 g in sodium chloride 0.9 % 100 mL IVPB  2 g Intravenous Q12H Jimmye Norman, NP 200 mL/hr at 10/15/19 1315 2 g at 10/15/19 1315  . Chlorhexidine Gluconate Cloth 2 % PADS 6 each  6 each Topical Daily Ouma, Hubbard Hartshorn, NP      . diphenhydrAMINE (BENADRYL) injection 12.5-25 mg  12.5-25 mg Intravenous Q6H PRN Bjorn Pippin, MD   25 mg at 10/15/19 0306   Or  . diphenhydrAMINE (BENADRYL) 12.5 MG/5ML elixir 12.5-25 mg  12.5-25 mg Oral Q6H PRN Bjorn Pippin, MD      . HYDROmorphone (DILAUDID) injection 1 mg  1 mg Intravenous Q1H PRN Torrian Canion C, MD   1 mg at 10/15/19 1318  . ipratropium-albuterol (DUONEB) 0.5-2.5 (3) MG/3ML  nebulizer solution 3 mL  3 mL Nebulization Q4H Darlin Priestly, MD   3 mL at 10/15/19 1207  . methylPREDNISolone sodium succinate (SOLU-MEDROL) 40 mg/mL injection 40 mg  40 mg Intravenous Daily Darlin Priestly, MD      . metroNIDAZOLE (FLAGYL) IVPB 500 mg  500 mg Intravenous Q8H Ouma, Hubbard Hartshorn, NP      . opium-belladonna (B&O SUPPRETTES) 16.2-60 MG suppository 1 suppository  1 suppository Rectal Q6H PRN Bjorn Pippin, MD   1 suppository at 10/14/19 [phone]  . oxybutynin (DITROPAN) tablet 10 mg  10 mg Oral QID Darlin Priestly, MD   10 mg at 10/15/19 1313  . oxyCODONE (Oxy IR/ROXICODONE) immediate release tablet 5 mg  5 mg Oral Q4H PRN Bjorn Pippin, MD   5 mg at 10/15/19 1318  . phenazopyridine (PYRIDIUM) tablet 200 mg  200 mg Oral TID PRN Bjorn Pippin, MD      . promethazine (PHENERGAN) injection 12.5 mg   12.5 mg Intravenous Q4H PRN Bjorn Pippin, MD   12.5 mg at 10/15/19 1114  . senna-docusate (Senokot-S) tablet 1 tablet  1 tablet Oral QHS PRN Bjorn Pippin, MD      . sodium phosphate (FLEET) 7-19 GM/118ML enema 1 enema  1 enema Rectal Once PRN Bjorn Pippin, MD      . tamsulosin [facility]) capsule 0.4 mg  0.4 mg Oral Daily Nelsy Madonna C, MD   0.4 mg at 10/14/19 2054  . [START ON 10/16/2019] vancomycin (VANCOCIN) IVPB 1000 mg/200 mL premix  1,000 mg Intravenous Q24H Jimmye Norman, NP      . zolpidem (AMBIEN) tablet 5 mg  5 mg Oral QHS PRN Bjorn Pippin, MD         Objective: Vital signs in last 24 hours: Temp:  [97.3 F (36.3 C)-103.1 F (39.5 C)] 98.3 F (36.8 C) (04/06 1130) Pulse Rate:  [94-167] 122 (04/06 1400) Resp:  [13-37] 27 (04/06 1400) BP: (84-158)/(49-77) 85/49 (04/06 1300) SpO2:  [75 %-100 %] 86 % (04/06 1400) FiO2 (%):  [45 %] 45 % (04/06 1207)  Intake/Output from previous day: 04/05 0701 - 04/06 0700 In: 120 [P.O.:120] Out: 600 [Urine:600] Intake/Output this shift: No intake/output data recorded.   Physical Exam: In moderate distress secondary to flank pain  Lab Results:  Recent Labs    10/14/19 2116 10/15/19 0044  WBC 9.4 6.3  HGB 12.3 12.0  HCT 38.7 36.2  PLT 121* 121*   BMET Recent Labs    10/14/19 2116 10/15/19 0044  NA 135 133*  K 4.2 3.4*  CL 101 97*  CO2 24 23  GLUCOSE 118* 135*  BUN 10 10  CREATININE 0.87 0.98  CALCIUM 8.7* 8.8*   PT/INR Recent Labs    10/14/19 2116  LABPROT 12.7  INR 1.0   ABG Recent Labs    10/14/19 2043  PHART 7.45  HCO3 25.7    Studies/Results: DG Abd 1 View  Result Date: 10/14/2019 CLINICAL DATA:  Flank pain EXAM: ABDOMEN - 1 VIEW COMPARISON:  CT abdomen 10/12/2019 FINDINGS: There is no bowel dilatation to suggest obstruction. There is no evidence of pneumoperitoneum, portal venous gas or pneumatosis. Left nephroureteral stent. 17 mm left lower pole renal calculus. 6 mm distal left ureteral calculus.  The osseous structures are unremarkable. IMPRESSION: Left nephroureteral stent in satisfactory position. 17 mm left lower pole renal calculus. 6 mm distal left ureteral calculus. Electronically Signed   By: Elige Ko   On: 10/14/2019 14:05   CT ANGIO CHEST PE  W OR WO CONTRAST  Result Date: 10/14/2019 CLINICAL DATA:  54 year old female with shortness of breath. EXAM: CT ANGIOGRAPHY CHEST WITH CONTRAST TECHNIQUE: Multidetector CT imaging of the chest was performed using the standard protocol during bolus administration of intravenous contrast. Multiplanar CT image reconstructions and MIPs were obtained to evaluate the vascular anatomy. CONTRAST:  66mL OMNIPAQUE IOHEXOL 350 MG/ML SOLN COMPARISON:  Portable chest earlier today. CTA chest 08/25/2018. FINDINGS: Cardiovascular: Adequate contrast bolus timing in the pulmonary arterial tree. Mild respiratory motion. No focal filling defect identified in the pulmonary arteries to suggest acute pulmonary embolism. No cardiomegaly or pericardial effusion. Negative thoracic aorta. Mild calcified plaque in the visible proximal abdominal aorta. Mildly tortuous proximal great vessels. Mediastinum/Nodes: No lymphadenopathy. Lungs/Pleura: Major airways are patent. Lower lung volumes with chronic centrilobular and paraseptal emphysema. Confluent but enhancing left lung base atelectasis with superimposed trace new layering left pleural effusion. Right costophrenic angle opacity also most resembles atelectasis. No right pleural effusion. Upper Abdomen: Grossly normal visible upper abdominal viscera, early contrast timing suspected in the spleen. No upper abdominal free fluid. Musculoskeletal: No acute osseous abnormality identified. Chronic postoperative changes to left lateral ribs with ORIF hardware. Midthoracic calcified ligament flavum hypertrophy. Review of the MIP images confirms the above findings. IMPRESSION: 1. Negative for acute pulmonary embolus. 2. Emphysema  (ICD10-J43.9) with lower lung volumes. Left lung base pneumonia is difficult to exclude, although lung enhancement there favors atelectasis. However, there is also a new trace left pleural effusion. Electronically Signed   By: Genevie Ann M.D.   On: 10/14/2019 23:03   DG Chest Port 1 View  Result Date: 10/14/2019 CLINICAL DATA:  Tachycardia, fever, hard time urinating EXAM: PORTABLE CHEST 1 VIEW COMPARISON:  Portable exam 2052 hours compared to 08/25/2018 FINDINGS: Upper normal heart size. Mediastinal contours normal. Scattered interstitial infiltrates appear increased since previous exam suspected combination of underlying chronic interstitial disease with superimposed infection or edema. Underlying emphysematous changes and questionable tiny LEFT pleural effusion. No pneumothorax. Bones demineralized with prior plating of lateral LEFT fourth and fifth ribs. BILATERAL breast prostheses. LEFT ureteral stent identified. IMPRESSION: Changes of COPD and chronic interstitial lung disease with superimposed interstitial infiltrates which could represent edema or infection. Electronically Signed   By: Lavonia Dana M.D.   On: 10/14/2019 21:01   DG OR UROLOGY CYSTO IMAGE ([place])  Result Date: 10/14/2019 There is no interpretation for this exam.  This order is for images obtained during a surgical procedure.  Please See "Surgeries" Tab for more information regarding the procedure.     Assessment: -Status post left ureteral stent placement by Dr. Jeffie Pollock 10/13/2019.  Ureteroscopic removal initially planned however patient was only stented secondary to pyelonephrosis. -Clinical sepsis >24 hours postop -Difficult to control left flank pain with stent in good position  s/p Procedure(s): CYSTOSCOPY WITH URETEROSCOPY, STENT PLACEMENT  Recommendation: -Discussed with patient that her pain may now be secondary to her stent as her obstruction is relieved after stent placement. -If pain not improved in 24 hours discussed  the possibility of percutaneous nephrostomy tube placement by radiology and stent removal though there is no guarantee that this will resolve her pain. -Will need to defer definitive stone treatment until at least next week with active infection   Abbie Sons 10/15/2019

## 2019-10-16 ENCOUNTER — Inpatient Hospital Stay: Payer: Medicaid Other

## 2019-10-16 DIAGNOSIS — R0902 Hypoxemia: Secondary | ICD-10-CM

## 2019-10-16 DIAGNOSIS — N200 Calculus of kidney: Secondary | ICD-10-CM

## 2019-10-16 LAB — BASIC METABOLIC PANEL
Anion gap: 11 (ref 5–15)
BUN: 7 mg/dL (ref 6–20)
CO2: 25 mmol/L (ref 22–32)
Calcium: 8.3 mg/dL — ABNORMAL LOW (ref 8.9–10.3)
Chloride: 96 mmol/L — ABNORMAL LOW (ref 98–111)
Creatinine, Ser: 0.76 mg/dL (ref 0.44–1.00)
GFR calc Af Amer: >60 mL/min (ref >60)
GFR calc non Af Amer: >60 mL/min (ref >60)
Glucose, Bld: 105 mg/dL — ABNORMAL HIGH (ref 70–99)
Potassium: 3 mmol/L — ABNORMAL LOW (ref 3.5–5.1)
Sodium: 132 mmol/L — ABNORMAL LOW (ref 135–145)

## 2019-10-16 LAB — CBC
HCT: 33.2 % — ABNORMAL LOW (ref 36.0–46.0)
Hemoglobin: 11.2 g/dL — ABNORMAL LOW (ref 12.0–15.0)
MCH: 30.8 pg (ref 26.0–34.0)
MCHC: 33.7 g/dL (ref 30.0–36.0)
MCV: 91.2 fL (ref 80.0–100.0)
Platelets: 124 10*3/uL — ABNORMAL LOW (ref 150–400)
RBC: 3.64 MIL/uL — ABNORMAL LOW (ref 3.87–5.11)
RDW: 13.9 % (ref 11.5–15.5)
WBC: 6.2 10*3/uL (ref 4.0–10.5)
nRBC: 0 % (ref 0.0–0.2)

## 2019-10-16 LAB — MRSA PCR SCREENING: MRSA by PCR: NEGATIVE

## 2019-10-16 LAB — MAGNESIUM: Magnesium: 1.7 mg/dL (ref 1.7–2.4)

## 2019-10-16 MED ORDER — DIPHENHYDRAMINE HCL 50 MG/ML IJ SOLN
25.0000 mg | Freq: Once | INTRAMUSCULAR | Status: AC
  Administered 2019-10-17: 02:00:00 25 mg via INTRAVENOUS
  Filled 2019-10-16: qty 1

## 2019-10-16 MED ORDER — LACTATED RINGERS IV SOLN: INTRAVENOUS | Status: DC

## 2019-10-16 MED ORDER — THIAMINE HCL 100 MG PO TABS
100.0000 mg | ORAL_TABLET | Freq: Every day | ORAL | Status: DC
  Administered 2019-10-16 – 2019-10-18 (×3): 100 mg via ORAL
  Filled 2019-10-16 (×3): qty 1

## 2019-10-16 MED ORDER — FOLIC ACID 1 MG PO TABS
1.0000 mg | ORAL_TABLET | Freq: Every day | ORAL | Status: DC
  Administered 2019-10-16 – 2019-10-18 (×3): 1 mg via ORAL
  Filled 2019-10-16 (×3): qty 1

## 2019-10-16 MED ORDER — PHENOL 1.4 % MT LIQD
1.0000 | OROMUCOSAL | Status: DC | PRN
  Administered 2019-10-17: 1 via OROMUCOSAL
  Filled 2019-10-16 (×2): qty 177

## 2019-10-16 MED ORDER — LORAZEPAM 1 MG PO TABS
1.0000 mg | ORAL_TABLET | ORAL | Status: DC | PRN
  Administered 2019-10-16: 1 mg via ORAL
  Administered 2019-10-16 (×3): 2 mg via ORAL
  Administered 2019-10-16: 3 mg via ORAL
  Administered 2019-10-17: 2 mg via ORAL
  Administered 2019-10-17: 4 mg via ORAL
  Administered 2019-10-17 (×3): 2 mg via ORAL
  Administered 2019-10-18: 1 mg via ORAL
  Filled 2019-10-16: qty 2
  Filled 2019-10-16: qty 1
  Filled 2019-10-16 (×2): qty 2
  Filled 2019-10-16: qty 4
  Filled 2019-10-16: qty 3
  Filled 2019-10-16: qty 4
  Filled 2019-10-16 (×4): qty 2
  Filled 2019-10-16: qty 1

## 2019-10-16 MED ORDER — THIAMINE HCL 100 MG/ML IJ SOLN: 100.0000 mg | Freq: Every day | INTRAMUSCULAR | Status: DC

## 2019-10-16 MED ORDER — SODIUM CHLORIDE 0.9 % IV SOLN
2.0000 g | INTRAVENOUS | Status: DC
  Administered 2019-10-16 – 2019-10-17 (×2): 2 g via INTRAVENOUS
  Filled 2019-10-16 (×2): qty 2

## 2019-10-16 MED ORDER — NICOTINE 21 MG/24HR TD PT24
21.0000 mg | MEDICATED_PATCH | Freq: Every day | TRANSDERMAL | Status: DC
  Administered 2019-10-16 – 2019-10-18 (×2): 21 mg via TRANSDERMAL
  Filled 2019-10-16 (×3): qty 1

## 2019-10-16 MED ORDER — LORAZEPAM 2 MG/ML IJ SOLN
1.0000 mg | INTRAMUSCULAR | Status: DC | PRN
  Administered 2019-10-17: 2 mg via INTRAVENOUS
  Filled 2019-10-16 (×2): qty 1

## 2019-10-16 MED ORDER — SODIUM CHLORIDE 0.9 % IV BOLUS
500.0000 mL | Freq: Once | INTRAVENOUS | Status: AC
  Administered 2019-10-16: 500 mL via INTRAVENOUS

## 2019-10-16 MED ORDER — ADULT MULTIVITAMIN W/MINERALS CH
1.0000 | ORAL_TABLET | Freq: Every day | ORAL | Status: DC
  Administered 2019-10-16 – 2019-10-18 (×3): 1 via ORAL
  Filled 2019-10-16 (×2): qty 1

## 2019-10-16 NOTE — Progress Notes (Signed)
PROGRESS NOTE    Teresa Davenport  UXN:[id number] DOB: 26-Apr-1966 DOA: 10/13/2019 PCP: Armando Gang, FNP    Assessment & Plan:   Active Problems:   Left ureteral stone   Dyspnea on exertion   Hypoxia   Tachycardia    Teresa Davenport is an 54 y.o. Caucasian female with a history of nephrolithiasis, admitted to the urology service on 10/13/2019, with pain from obstructive calculus, postop day 1 of left ureteral stent placement with noted pyelonephrosis during the procedure, and kept inpatient for antibiotic treatment, now on Rocephin and Cipro.  Patient continues to have flank pain following her procedure. She has been having shortness of breath with minimal exertion.  She continues to complain of severe left flank pain with minimal relief with as needed oxycodone and Dilaudid.  Hospitalist was consulted on 10/14/19, and offered to take over as primary on 10/15/19.  # Severe left flank pain 2/2 # Left ureteral lithiasis and  # severe left hydronephrosis and hydroureter S/p stent placement 10/13/2019 # 18 mm calculus inferior pole LEFT kidney -Patient continues to have severe left flank pain  PLAN: --continue flomax and oxybutynin -Continue IV hydration --Renal US today showed improvement of left hydronephrosis --Will need to defer definitive stone treatment until at least next week with active infection --Oxycodone 10 mg q4h PRN --d/c IV dilaudid 1 mg q3h PRN   Sepsis 2/2 Pyelonephritis  On 4/5, pt developed sepsis with WBC 14.2, fever, tachycardia.  Recent instrumentation.  Blood cx obtained on 4/6  -urine cx grew E coli  PLAN: -de-escalate abx to ceftriaxone today, per urine sensitivity. -Continue IV hydration --f/u blood cx  Dyspnea on exertion Hypoxia Hx of COPD, does not appear to be in exacerbation -Suspect primarily related to splinting from persistent severe left flank pain -CTA neg for PE PLAN: -Continue oxygen supplementation, goal 88-92% --DuoNeb  scheduled  Anxiety --getting ativan per CIWA  Confusion and hallucination Daily alcohol drinker --continue CIWA   DVT prophylaxis: SCD/Compression stockings Code Status: Full code  Family Communication:  Disposition Plan: Home, unclear when, still on IV abx, still having a lot of pain, may need to have definitive stone removal before discharge.    Subjective and Interval History:  Pt was noted to be confused overnight and hallucinating; pt put on CIWA.  This morning, pt reported feeling better, though still complained of pain.  Good urine output.  No hematuria.  No N/V/D or increased swelling.   Objective: Vitals:   10/16/19 1447 10/16/19 1500 10/16/19 1600 10/16/19 1700  BP:  (!) 96/54 118/77 106/64  Pulse: (!) 110 (!) 108 (!) 108 92  Resp: (!) 25 (!) 22 16 (!) 23  Temp:      TempSrc:      SpO2: 99% 99% (!) 83% 95%  Weight:      Height:        Intake/Output Summary (Last 24 hours) at 10/16/2019 1748 Last data filed at 10/16/2019 1700 Gross per 24 hour  Intake 2935.67 ml  Output 2100 ml  Net 835.67 ml   Filed Weights   10/13/19 0530  Weight: 63 kg    Examination:   Constitutional: NAD, AAOx3, sleepy and kept dozing off HEENT: conjunctivae and lids normal, EOMI CV: RRR no M,R,G. Distal pulses +2.  No cyanosis.   RESP: CTA B/L, normal respiratory effort, 85% on RA GI: +BS, NTND Extremities: No effusions, edema, or tenderness in BLE SKIN: warm, dry and intact Neuro: II - XII grossly intact.  Sensation intact   Data Reviewed: I have personally reviewed following labs and imaging studies  CBC: Recent Labs  Lab 10/12/19 1408 10/14/19 1042 10/14/19 2116 10/15/19 0044 10/16/19 0834  WBC 9.6 14.2* 9.4 6.3 6.2  NEUTROABS 8.6*  --   --  5.6  --   HGB 14.4 11.4* 12.3 12.0 11.2*  HCT 43.7 35.0* 38.7 36.2 33.2*  MCV 93.4 94.9 97.7 93.1 91.2  PLT 254 138* 121* 121* 124*   Basic Metabolic Panel: Recent Labs  Lab 10/12/19 1408 10/14/19 1042 10/14/19 2116  10/15/19 0044 10/16/19 0834  NA 139 136 135 133* 132*  K 3.7 4.1 4.2 3.4* 3.0*  CL 102 104 101 97* 96*  CO2 GLUCOSE 129* 116* 118* 135* 105*  BUN CREATININE 0.88 0.77 0.87 0.98 0.76  CALCIUM 9.3 8.8* 8.7* 8.8* 8.3*  MG  --   --   --   --  1.7   GFR: Estimated Creatinine Clearance: 71 mL/min (by C-G formula based on SCr of 0.76 mg/dL). Liver Function Tests: Recent Labs  Lab 10/12/19 1408 10/15/19 0044  AST 40 40  ALT 29 39  ALKPHOS 83 115  BILITOT 0.8 1.3*  PROT 7.7 6.9  ALBUMIN 4.4 3.7   Recent Labs  Lab 10/12/19 1408  LIPASE 24   No results for input(s): AMMONIA in the last 168 hours. Coagulation Profile: Recent Labs  Lab 10/14/19 2116  INR 1.0   Cardiac Enzymes: No results for input(s): CKTOTAL, CKMB, CKMBINDEX, TROPONINI in the last 168 hours. BNP (last 3 results) No results for input(s): PROBNP in the last 8760 hours. HbA1C: No results for input(s): HGBA1C in the last 72 hours. CBG: No results for input(s): GLUCAP in the last 168 hours. Lipid Profile: No results for input(s): CHOL, HDL, LDLCALC, TRIG, CHOLHDL, LDLDIRECT in the last 72 hours. Thyroid Function Tests: Recent Labs    10/14/19 2116  TSH 1.801   Anemia Panel: No results for input(s): VITAMINB12, FOLATE, FERRITIN, TIBC, IRON, RETICCTPCT in the last 72 hours. Sepsis Labs: Recent Labs  Lab 10/14/19 2116 10/15/19 0044 10/15/19 0842  LATICACIDVEN 1.3 1.2 1.4    Recent Results (from the past 240 hour(s))  Anaerobic culture     Status: None (Preliminary result)   Collection Time: 10/13/19  5:25 AM   Specimen: PATH Other; Tissue  Result Value Ref Range Status   Specimen Description   Final    URINE, RANDOM Performed at [facility], [address] [place], [place] [address]    Special Requests   Final    NONE Performed at [facility], [address] [place], [place] [address]    Culture   Final    NO ANAEROBES ISOLATED; CULTURE  IN PROGRESS FOR 5 DAYS   Report Status PENDING  Incomplete  Urine Culture     Status: Abnormal   Collection Time: 10/13/19  5:25 AM   Specimen: Urine, Random  Result Value Ref Range Status   Specimen Description URINE, RANDOM  Final   Special Requests NONE  Final   Culture >=100,000 COLONIES/mL ESCHERICHIA COLI (A)  Final   Report Status 10/15/2019 FINAL  Final   Organism ID, Bacteria ESCHERICHIA COLI (A)  Final      Susceptibility   Escherichia coli - MIC*    AMPICILLIN >=32 RESISTANT Resistant     CEFAZOLIN <=4 SENSITIVE Sensitive     CEFTRIAXONE <=0.25 SENSITIVE Sensitive  CIPROFLOXACIN >=4 RESISTANT Resistant     GENTAMICIN <=1 SENSITIVE Sensitive     IMIPENEM <=0.25 SENSITIVE Sensitive     NITROFURANTOIN 64 INTERMEDIATE Intermediate     TRIMETH/SULFA >=320 RESISTANT Resistant     AMPICILLIN/SULBACTAM >=32 RESISTANT Resistant     PIP/TAZO <=4 SENSITIVE Sensitive     * >=100,000 COLONIES/mL ESCHERICHIA COLI  Respiratory Panel by RT PCR (Flu A&B, Covid) - Nasopharyngeal Swab     Status: None   Collection Time: 10/13/19  6:33 AM   Specimen: Nasopharyngeal Swab  Result Value Ref Range Status   SARS Coronavirus 2 by RT PCR NEGATIVE NEGATIVE Final    Comment: (NOTE) SARS-CoV-2 target nucleic acids are NOT DETECTED. The SARS-CoV-2 RNA is generally detectable in upper respiratoy specimens during the acute phase of infection. The lowest concentration of SARS-CoV-2 viral copies this assay can detect is 131 copies/mL. A negative result does not preclude SARS-Cov-2 infection and should not be used as the sole basis for treatment or other patient management decisions. A negative result may occur with  improper specimen collection/handling, submission of specimen other than nasopharyngeal swab, presence of viral mutation(s) within the areas targeted by this assay, and inadequate number of viral copies (<131 copies/mL). A negative result must be combined with clinical observations,  patient history, and epidemiological information. The expected result is Negative. Fact Sheet for Patients:  https://www.moore.com/ Fact Sheet for Healthcare Providers:  https://www.young.biz/ This test is not yet ap proved or cleared by the [place] FDA and  has been authorized for detection and/or diagnosis of SARS-CoV-2 by FDA under an Emergency Use Authorization (EUA). This EUA will remain  in effect (meaning this test can be used) for the duration of the COVID-19 declaration under Section 564(b)(1) of the Act, 21 U.S.C. section 360bbb-3(b)(1), unless the authorization is terminated or revoked sooner.    Influenza A by PCR NEGATIVE NEGATIVE Final   Influenza B by PCR NEGATIVE NEGATIVE Final    Comment: (NOTE) The Xpert Xpress SARS-CoV-2/FLU/RSV assay is intended as an aid in  the diagnosis of influenza from Nasopharyngeal swab specimens and  should not be used as a sole basis for treatment. Nasal washings and  aspirates are unacceptable for Xpert Xpress SARS-CoV-2/FLU/RSV  testing. Fact Sheet for Patients: https://www.moore.com/ Fact Sheet for Healthcare Providers: https://www.young.biz/ This test is not yet approved or cleared by the [place] FDA and  has been authorized for detection and/or diagnosis of SARS-CoV-2 by  FDA under an Emergency Use Authorization (EUA). This EUA will remain  in effect (meaning this test can be used) for the duration of the  Covid-19 declaration under Section 564(b)(1) of the Act, 21  U.S.C. section 360bbb-3(b)(1), unless the authorization is  terminated or revoked. Performed at [facility], [address] [place], [place] [address]   CULTURE, BLOOD (ROUTINE X 2) w Reflex to ID Panel     Status: None (Preliminary result)   Collection Time: 10/15/19 12:44 AM   Specimen: BLOOD  Result Value Ref Range Status   Specimen Description BLOOD BLOOD RIGHT  HAND  Final   Special Requests   Final    BOTTLES DRAWN AEROBIC AND ANAEROBIC Blood Culture adequate volume   Culture   Final    NO GROWTH 1 DAY Performed at [facility], [address] [place], [place] [address]    Report Status PENDING  Incomplete  CULTURE, BLOOD (ROUTINE X 2) w Reflex to ID Panel     Status: None (Preliminary result)  Collection Time: 10/15/19 12:56 AM   Specimen: BLOOD  Result Value Ref Range Status   Specimen Description BLOOD LEFT ANTECUBITAL  Final   Special Requests   Final    BOTTLES DRAWN AEROBIC AND ANAEROBIC Blood Culture adequate volume   Culture   Final    NO GROWTH 1 DAY Performed at [facility], [address] [place], [place] [address]    Report Status PENDING  Incomplete  MRSA PCR Screening     Status: None   Collection Time: 10/16/19  8:15 AM   Specimen: Nasopharyngeal  Result Value Ref Range Status   MRSA by PCR NEGATIVE NEGATIVE Final    Comment:        The GeneXpert MRSA Assay (FDA approved for NASAL specimens only), is one component of a comprehensive MRSA colonization surveillance program. It is not intended to diagnose MRSA infection nor to guide or monitor treatment for MRSA infections. Performed at [facility], [address] [place], [place] [address]       Radiology Studies: CT HEAD WO CONTRAST  Result Date: 10/16/2019 CLINICAL DATA:  Initial evaluation for acute encephalopathy. EXAM: CT HEAD WITHOUT CONTRAST TECHNIQUE: Contiguous axial images were obtained from the base of the skull through the vertex without intravenous contrast. COMPARISON:  None available. FINDINGS: Brain: Cerebral volume within normal limits for patient age. No evidence for acute intracranial hemorrhage. No findings to suggest acute large vessel territory infarct. No mass lesion, midline shift, or mass effect. Ventricles are normal in size without evidence for hydrocephalus. No extra-axial fluid collection identified.  Vascular: No hyperdense vessel identified. Skull: Scalp soft tissues demonstrate no acute abnormality. Calvarium intact. Sinuses/Orbits: Globes and orbital soft tissues within normal limits. Visualized paranasal sinuses are clear. No mastoid effusion. IMPRESSION: Negative head CT.  No acute intracranial abnormality identified. Electronically Signed   By: Rise MuBenjamin  McClintock M.D.   On: 10/16/2019 03:35   CT ANGIO CHEST PE W OR WO CONTRAST  Result Date: 10/14/2019 CLINICAL DATA:  54 year old female with shortness of breath. EXAM: CT ANGIOGRAPHY CHEST WITH CONTRAST TECHNIQUE: Multidetector CT imaging of the chest was performed using the standard protocol during bolus administration of intravenous contrast. Multiplanar CT image reconstructions and MIPs were obtained to evaluate the vascular anatomy. CONTRAST:  75mL OMNIPAQUE IOHEXOL 350 MG/ML SOLN COMPARISON:  Portable chest earlier today. CTA chest 08/25/2018. FINDINGS: Cardiovascular: Adequate contrast bolus timing in the pulmonary arterial tree. Mild respiratory motion. No focal filling defect identified in the pulmonary arteries to suggest acute pulmonary embolism. No cardiomegaly or pericardial effusion. Negative thoracic aorta. Mild calcified plaque in the visible proximal abdominal aorta. Mildly tortuous proximal great vessels. Mediastinum/Nodes: No lymphadenopathy. Lungs/Pleura: Major airways are patent. Lower lung volumes with chronic centrilobular and paraseptal emphysema. Confluent but enhancing left lung base atelectasis with superimposed trace new layering left pleural effusion. Right costophrenic angle opacity also most resembles atelectasis. No right pleural effusion. Upper Abdomen: Grossly normal visible upper abdominal viscera, early contrast timing suspected in the spleen. No upper abdominal free fluid. Musculoskeletal: No acute osseous abnormality identified. Chronic postoperative changes to left lateral ribs with ORIF hardware. Midthoracic  calcified ligament flavum hypertrophy. Review of the MIP images confirms the above findings. IMPRESSION: 1. Negative for acute pulmonary embolus. 2. Emphysema (ICD10-J43.9) with lower lung volumes. Left lung base pneumonia is difficult to exclude, although lung enhancement there favors atelectasis. However, there is also a new trace left pleural effusion. Electronically Signed   By: Odessa FlemingH  Hall M.D.   On:  10/14/2019 23:03   US RENAL  Result Date: 10/16/2019 CLINICAL DATA:  Hydronephrosis EXAM: RENAL / URINARY TRACT ULTRASOUND COMPLETE COMPARISON:  Renal ultrasound 09/02/2016 FINDINGS: Right Kidney: Renal measurements: 12.9 x 4.8 x 6.0 cm = volume: 196 mL. Cortical thinning. Normal cortical echogenicity. No mass, hydronephrosis or shadowing calcification. Left Kidney: Renal measurements: 11.7 x 4.3 x 5.5 cm = volume: 144 mL. Cortical thinning. Upper normal cortical echogenicity. Mild LEFT hydronephrosis. No renal mass lesion. Shadowing calcification identified at inferior pole 18 mm diameter. No perinephric fluid. Bladder: Well distended, normal appearance. Other: N/A IMPRESSION: Mild LEFT hydronephrosis. 18 mm shadowing calculus inferior pole LEFT kidney. Electronically Signed   By: Lavonia Dana M.D.   On: 10/16/2019 09:27   DG Chest Port 1 View  Result Date: 10/14/2019 CLINICAL DATA:  Tachycardia, fever, hard time urinating EXAM: PORTABLE CHEST 1 VIEW COMPARISON:  Portable exam 2052 hours compared to 08/25/2018 FINDINGS: Upper normal heart size. Mediastinal contours normal. Scattered interstitial infiltrates appear increased since previous exam suspected combination of underlying chronic interstitial disease with superimposed infection or edema. Underlying emphysematous changes and questionable tiny LEFT pleural effusion. No pneumothorax. Bones demineralized with prior plating of lateral LEFT fourth and fifth ribs. BILATERAL breast prostheses. LEFT ureteral stent identified. IMPRESSION: Changes of COPD and chronic  interstitial lung disease with superimposed interstitial infiltrates which could represent edema or infection. Electronically Signed   By: Lavonia Dana M.D.   On: 10/14/2019 21:01   DG OR UROLOGY CYSTO IMAGE ([place])  Result Date: 10/14/2019 There is no interpretation for this exam.  This order is for images obtained during a surgical procedure.  Please See "Surgeries" Tab for more information regarding the procedure.   ECHOCARDIOGRAM COMPLETE  Result Date: 10/15/2019    ECHOCARDIOGRAM REPORT   Patient Name:   FERRELL FLAM Date of Exam: 10/15/2019 Medical Rec #:  [id number]            Height:       62.0 in Accession #:    [id number]           Weight:       138.9 lb Date of Birth:  1965-07-16            BSA:          1.637 m Patient Age:    53 years             BP:           84/60 mmHg Patient Gender: F                    HR:           120 bpm. Exam Location:  [facility] Procedure: 2D Echo Indications:     Abnormal ECG  History:         Patient has no prior history of Echocardiogram examinations.                  Risk Factors:Current Smoker.  Sonographer:     Tillie Rung Thornton-Maynard Referring Phys:  [id number] Bing Neighbors OUMA Diagnosing Phys: Nelva Bush MD  Sonographer Comments: Image acquisition challenging due to breast implants. IMPRESSIONS  1. Left ventricular ejection fraction, by estimation, is 65 to 70%. The left ventricle has normal function. The left ventricle has no regional wall motion abnormalities. Left ventricular diastolic parameters were normal.  2. Right ventricular systolic function is normal. The right ventricular size is normal. There is normal pulmonary artery systolic pressure.  3. The mitral valve is grossly normal. Trivial mitral valve regurgitation. No evidence of mitral stenosis.  4. The aortic valve was not well visualized. Aortic valve regurgitation is not visualized. No aortic stenosis is present.  5. The inferior vena cava is normal in size with greater than 50%  respiratory variability, suggesting right atrial pressure of 3 mmHg. FINDINGS  Left Ventricle: Left ventricular ejection fraction, by estimation, is 65 to 70%. The left ventricle has normal function. The left ventricle has no regional wall motion abnormalities. The left ventricular internal cavity size was normal in size. There is  no left ventricular hypertrophy. Left ventricular diastolic parameters were normal. Right Ventricle: The right ventricular size is normal. No increase in right ventricular wall thickness. Right ventricular systolic function is normal. There is normal pulmonary artery systolic pressure. The tricuspid regurgitant velocity is 2.82 m/s, and  with an assumed right atrial pressure of 3 mmHg, the estimated right ventricular systolic pressure is 34.8 mmHg. Left Atrium: Left atrial size was normal in size. Right Atrium: Right atrial size was normal in size. Pericardium: There is no evidence of pericardial effusion. Mitral Valve: The mitral valve is grossly normal. Trivial mitral valve regurgitation. No evidence of mitral valve stenosis. MV peak gradient, 6.1 mmHg. The mean mitral valve gradient is 4.0 mmHg. Tricuspid Valve: The tricuspid valve is not well visualized. Tricuspid valve regurgitation is trivial. Aortic Valve: The aortic valve was not well visualized. Aortic valve regurgitation is not visualized. No aortic stenosis is present. Aortic valve mean gradient measures 5.0 mmHg. Aortic valve peak gradient measures 8.5 mmHg. Aortic valve area, by VTI measures 1.66 cm. Pulmonic Valve: The pulmonic valve was not well visualized. Pulmonic valve regurgitation is not visualized. No evidence of pulmonic stenosis. Aorta: The aortic root is normal in size and structure. Pulmonary Artery: The pulmonary artery is not well seen. Venous: The inferior vena cava is normal in size with greater than 50% respiratory variability, suggesting right atrial pressure of 3 mmHg. IAS/Shunts: The interatrial septum  was not well visualized.  LEFT VENTRICLE PLAX 2D LVIDd:         3.74 cm  Diastology LVIDs:         2.61 cm  LV e' lateral:   16.80 cm/s LV PW:         0.86 cm  LV E/e' lateral: 6.5 LV IVS:        0.81 cm  LV e' medial:    13.60 cm/s LVOT diam:     1.70 cm  LV E/e' medial:  8.1 LV SV:         42 LV SV Index:   26 LVOT Area:     2.27 cm  RIGHT VENTRICLE RV S prime:     10.60 cm/s TAPSE (M-mode): 2.4 cm LEFT ATRIUM             Index       RIGHT ATRIUM           Index LA diam:        2.60 cm 1.59 cm/m  RA Area:     15.40 cm LA Vol (A2C):   38.4 ml 23.42 ml/m RA Volume:   38.30 ml  23.39 ml/m LA Vol (A4C):   34.3 ml 20.95 ml/m LA Biplane Vol: 40.3 ml 24.61 ml/m  AORTIC VALVE AV Area (Vmax):    1.74 cm AV Area (Vmean):   1.58 cm AV Area (VTI):     1.66 cm AV Vmax:  146.00 cm/s AV Vmean:          106.000 cm/s AV VTI:            0.252 m AV Peak Grad:      8.5 mmHg AV Mean Grad:      5.0 mmHg LVOT Vmax:         112.00 cm/s LVOT Vmean:        73.800 cm/s LVOT VTI:          0.184 m LVOT/AV VTI ratio: 0.73  AORTA Ao Root diam: 3.10 cm MITRAL VALVE                TRICUSPID VALVE MV Area (PHT): 4.78 cm     TR Peak grad:   31.8 mmHg MV Peak grad:  6.1 mmHg     TR Vmax:        282.00 cm/s MV Mean grad:  4.0 mmHg MV Vmax:       1.23 m/s     SHUNTS MV Vmean:      95.2 cm/s    Systemic VTI:  0.18 m MV E velocity: 109.50 cm/s  Systemic Diam: 1.70 cm MV A velocity: 88.05 cm/s MV E/A ratio:  1.24 Cristal Deer End MD Electronically signed by Yvonne Kendall MD Signature Date/Time: 10/15/2019/2:43:03 PM    Final      Scheduled Meds: . Chlorhexidine Gluconate Cloth  6 each Topical Daily  . diphenhydrAMINE  25 mg Intravenous Once  . folic acid  1 mg Oral Daily  . ipratropium-albuterol  3 mL Nebulization Q4H  . multivitamin with minerals  1 tablet Oral Daily  . nicotine  21 mg Transdermal Daily  . oxybutynin  10 mg Oral QID  . tamsulosin  0.4 mg Oral Daily  . thiamine  100 mg Oral Daily   Or  . thiamine  100 mg  Intravenous Daily   Continuous Infusions: . cefTRIAXone (ROCEPHIN)  IV Stopped (10/16/19 1438)     LOS: 1 day     Darlin Priestly, MD [facility] If 7PM-7AM, please contact night-coverage 10/16/2019, 5:48 PM

## 2019-10-16 NOTE — Progress Notes (Addendum)
Patient with worsening altered mental status during the night, per RN "Patient talking out of her head and confusing RN with daughter". She constantly asking to leave and talking to herself. There were no focal neurological deficits. STAT CT head obtain which showed no acute intracranial abnormality. Will also place of CIWA protocol given hx of ETOH use and possible withdrawal.   Webb Silversmith, DNP, CCRN, FNP-C [facility] Nurse Practitioner

## 2019-10-16 NOTE — Progress Notes (Signed)
Urology Inpatient Progress Note  Subjective: Teresa Davenport is a 54 y.o. female with PMH nephrolithiasis admitted on 10/13/2019 with a 5 mm distal left ureteral stone.  She is POD 3 from left ureteral stent placement with Dr. Jeffie Pollock with intraoperative findings notable for pyelonephrosis.  Postoperative period has been complicated by poor pain control, fever, and tachycardia requiring transfer to the ICU.  Follow-up KUB confirmed appropriate stent placement.  She is on culture appropriate antibiotics for UTI.  Patient noted to have acute encephalopathy overnight.  CT head revealed no acute abnormalities.  She was placed on CIWA protocol given history of EtOH use and possible withdrawal.   Creatinine down today, 0.76.  WBC count stable today, 6.2.  Blood cultures pending with no growth at 1 day.  She is afebrile and remains borderline tachycardic and borderline hypotensive.  She is sleeping upon rounds today and unable to contribute to HPI.  Anti-infectives: Anti-infectives (From admission, onward)   Start     Dose/Rate Route Frequency Ordered Stop   10/16/19 1400  cefTRIAXone (ROCEPHIN) 2 g in sodium chloride 0.9 % 100 mL IVPB     2 g 200 mL/hr over 30 Minutes Intravenous Every 24 hours 10/16/19 1248     10/16/19 0300  vancomycin (VANCOCIN) IVPB 1000 mg/200 mL premix  Status:  Discontinued     1,000 mg 200 mL/hr over 60 Minutes Intravenous Every 24 hours 10/15/19 0918 10/16/19 1248   10/15/19 1400  ceFEPIme (MAXIPIME) 2 g in sodium chloride 0.9 % 100 mL IVPB  Status:  Discontinued     2 g 200 mL/hr over 30 Minutes Intravenous Every 12 hours 10/15/19 0922 10/16/19 1248   10/15/19 0930  metroNIDAZOLE (FLAGYL) IVPB 500 mg  Status:  Discontinued     500 mg 100 mL/hr over 60 Minutes Intravenous Every 8 hours 10/15/19 0924 10/16/19 1248   10/15/19 0200  ceFEPIme (MAXIPIME) 2 g in sodium chloride 0.9 % 100 mL IVPB  Status:  Discontinued     2 g 200 mL/hr over 30 Minutes Intravenous Every 8  hours 10/15/19 0109 10/15/19 0922   10/15/19 0115  vancomycin (VANCOREADY) IVPB 1250 mg/250 mL     1,250 mg 166.7 mL/hr over 90 Minutes Intravenous  Once 10/15/19 0109 10/15/19 0427   10/14/19 1100  cefTRIAXone (ROCEPHIN) 1 g in sodium chloride 0.9 % 100 mL IVPB  Status:  Discontinued     1 g 200 mL/hr over 30 Minutes Intravenous Every 24 hours 10/14/19 1018 10/15/19 0109   10/13/19 2000  ciprofloxacin (CIPRO) IVPB 400 mg  Status:  Discontinued     400 mg 200 mL/hr over 60 Minutes Intravenous Every 12 hours 10/13/19 1036 10/14/19 1018   10/13/19 0840  ciprofloxacin (CIPRO) 400 MG/200ML IVPB    Note to Pharmacy: Huffines, Mindy   : cabinet override      10/13/19 0840 10/13/19 0844   10/13/19 0000  ciprofloxacin (CIPRO) 500 MG tablet     500 mg Oral 2 times daily 10/13/19 0912 10/23/19 2359      Current Facility-Administered Medications  Medication Dose Route Frequency Provider Last Rate Last Admin  . acetaminophen (TYLENOL) tablet 650 mg  650 mg Oral Q4H PRN Irine Seal, MD   650 mg at 10/15/19 1027  . bisacodyl (DULCOLAX) suppository 10 mg  10 mg Rectal Daily PRN Irine Seal, MD      . cefTRIAXone (ROCEPHIN) 2 g in sodium chloride 0.9 % 100 mL IVPB  2 g Intravenous Q24H Enzo Bi,  MD 200 mL/hr at 10/16/19 1408 2 g at 10/16/19 1408  . Chlorhexidine Gluconate Cloth 2 % PADS 6 each  6 each Topical Daily Jimmye Norman, NP   6 each at 10/16/19 [phone]  . diphenhydrAMINE (BENADRYL) injection 12.5-25 mg  12.5-25 mg Intravenous Q6H PRN Bjorn Pippin, MD   25 mg at 10/15/19 0306   Or  . diphenhydrAMINE (BENADRYL) 12.5 MG/5ML elixir 12.5-25 mg  12.5-25 mg Oral Q6H PRN Bjorn Pippin, MD   25 mg at 10/16/19 0047  . diphenhydrAMINE (BENADRYL) injection 25 mg  25 mg Intravenous Once Jimmye Norman, NP   Stopped at 10/16/19 0150  . folic acid (FOLVITE) tablet 1 mg  1 mg Oral Daily Jimmye Norman, NP   1 mg at 10/16/19 6812  . ipratropium-albuterol (DUONEB) 0.5-2.5 (3) MG/3ML  nebulizer solution 3 mL  3 mL Nebulization Q4H Darlin Priestly, MD   3 mL at 10/16/19 1151  . LORazepam (ATIVAN) tablet 1-4 mg  1-4 mg Oral Q1H PRN Jimmye Norman, NP   2 mg at 10/16/19 0829   Or  . LORazepam (ATIVAN) injection 1-4 mg  1-4 mg Intravenous Q1H PRN Jimmye Norman, NP      . multivitamin with minerals tablet 1 tablet  1 tablet Oral Daily Jimmye Norman, NP   1 tablet at 10/16/19 7517  . nicotine (NICODERM CQ - dosed in mg/24 hours) patch 21 mg  21 mg Transdermal Daily Darlin Priestly, MD      . opium-belladonna (B&O SUPPRETTES) 16.2-60 MG suppository 1 suppository  1 suppository Rectal Q6H PRN Bjorn Pippin, MD   1 suppository at 10/14/19 [phone]  . oxybutynin (DITROPAN) tablet 10 mg  10 mg Oral QID Darlin Priestly, MD   10 mg at 10/16/19 0927  . oxyCODONE (Oxy IR/ROXICODONE) immediate release tablet 10 mg  10 mg Oral Q4H PRN Darlin Priestly, MD   10 mg at 10/16/19 0736  . phenazopyridine (PYRIDIUM) tablet 200 mg  200 mg Oral TID PRN Bjorn Pippin, MD      . phenol (CHLORASEPTIC) mouth spray 1 spray  1 spray Mouth/Throat PRN Stoioff, Scott C, MD      . promethazine (PHENERGAN) injection 12.5 mg  12.5 mg Intravenous Q4H PRN Bjorn Pippin, MD   12.5 mg at 10/15/19 1114  . senna-docusate (Senokot-S) tablet 1 tablet  1 tablet Oral QHS PRN Bjorn Pippin, MD      . sodium phosphate (FLEET) 7-19 GM/118ML enema 1 enema  1 enema Rectal Once PRN Bjorn Pippin, MD      . tamsulosin [facility]) capsule 0.4 mg  0.4 mg Oral Daily Stoioff, Scott C, MD   0.4 mg at 10/16/19 0926  . thiamine tablet 100 mg  100 mg Oral Daily Jimmye Norman, NP   100 mg at 10/16/19 4944   Or  . thiamine (B-1) injection 100 mg  100 mg Intravenous Daily Jimmye Norman, NP       Objective: Vital signs in last 24 hours: Temp:  [98.9 F (37.2 C)-99.9 F (37.7 C)] 98.9 F (37.2 C) (04/07 1200) Pulse Rate:  [98-120] 108 (04/07 1500) Resp:  [17-29] 22 (04/07 1500) BP: (95-134)/(51-96) 96/54 (04/07 1500) SpO2:  [75  %-100 %] 99 % (04/07 1500)  Intake/Output from previous day: 04/06 0701 - 04/07 0700 In: 1430.8 [I.V.:814.3; IV Piggyback:616.5] Out: 200 [Urine:200] Intake/Output this shift: Total I/O In: 1260.5 [P.O.:480; I.V.:680.4; IV Piggyback:100] Out: 2100 [Urine:2100]  Physical Exam Vitals and nursing note reviewed.  Constitutional:      General: She is sleeping.  HENT:     Head: Normocephalic and atraumatic.  Pulmonary:     Effort: Pulmonary effort is normal. No respiratory distress.  Skin:    General: Skin is warm and dry.    Lab Results:  Recent Labs    10/15/19 0044 10/16/19 0834  WBC 6.3 6.2  HGB 12.0 11.2*  HCT 36.2 33.2*  PLT 121* 124*   BMET Recent Labs    10/15/19 0044 10/16/19 0834  NA 133* 132*  K 3.4* 3.0*  CL 97* 96*  CO2 23 25  GLUCOSE 135* 105*  BUN 10 7  CREATININE 0.98 0.76  CALCIUM 8.8* 8.3*   PT/INR Recent Labs    10/14/19 2116  LABPROT 12.7  INR 1.0   ABG Recent Labs    10/14/19 2043  PHART 7.45  HCO3 25.7   Studies/Results: US RENAL  Result Date: 10/16/2019 CLINICAL DATA:  Hydronephrosis EXAM: RENAL / URINARY TRACT ULTRASOUND COMPLETE COMPARISON:  Renal ultrasound 09/02/2016 FINDINGS: Right Kidney: Renal measurements: 12.9 x 4.8 x 6.0 cm = volume: 196 mL. Cortical thinning. Normal cortical echogenicity. No mass, hydronephrosis or shadowing calcification. Left Kidney: Renal measurements: 11.7 x 4.3 x 5.5 cm = volume: 144 mL. Cortical thinning. Upper normal cortical echogenicity. Mild LEFT hydronephrosis. No renal mass lesion. Shadowing calcification identified at inferior pole 18 mm diameter. No perinephric fluid. Bladder: Well distended, normal appearance. Other: N/A IMPRESSION: Mild LEFT hydronephrosis. 18 mm shadowing calculus inferior pole LEFT kidney. Electronically Signed   By: Ulyses Southward M.D.   On: 10/16/2019 09:27   I personally reviewed the imaging above and note improved left hydronephrosis with distended urinary  bladder.  Assessment & Plan: 54 year old female with a history of nephrolithiasis s/p left ureteral stent placement for management of a 5 mm obstructing distal left ureteral stone.  Renal ultrasound revealed improvement in left hydronephrosis.  Recommend continuing culture appropriate antibiotics and pain control.  Continue to offer nephrostomy tube as an alternative to ureteral stent if we are unable to manage her pain.  Carman Ching, PA-C 10/16/2019

## 2019-10-17 LAB — BASIC METABOLIC PANEL
Anion gap: 11 (ref 5–15)
BUN: 9 mg/dL (ref 6–20)
CO2: 26 mmol/L (ref 22–32)
Calcium: 8.5 mg/dL — ABNORMAL LOW (ref 8.9–10.3)
Chloride: 98 mmol/L (ref 98–111)
Creatinine, Ser: 0.81 mg/dL (ref 0.44–1.00)
GFR calc Af Amer: >60 mL/min (ref >60)
GFR calc non Af Amer: >60 mL/min (ref >60)
Glucose, Bld: 90 mg/dL (ref 70–99)
Potassium: 3.1 mmol/L — ABNORMAL LOW (ref 3.5–5.1)
Sodium: 135 mmol/L (ref 135–145)

## 2019-10-17 LAB — CBC
HCT: 34.6 % — ABNORMAL LOW (ref 36.0–46.0)
Hemoglobin: 11.7 g/dL — ABNORMAL LOW (ref 12.0–15.0)
MCH: 30.2 pg (ref 26.0–34.0)
MCHC: 33.8 g/dL (ref 30.0–36.0)
MCV: 89.4 fL (ref 80.0–100.0)
Platelets: 129 10*3/uL — ABNORMAL LOW (ref 150–400)
RBC: 3.87 MIL/uL (ref 3.87–5.11)
RDW: 14.3 % (ref 11.5–15.5)
WBC: 7.7 10*3/uL (ref 4.0–10.5)
nRBC: 0 % (ref 0.0–0.2)

## 2019-10-17 LAB — MAGNESIUM: Magnesium: 1.7 mg/dL (ref 1.7–2.4)

## 2019-10-17 MED ORDER — ENOXAPARIN SODIUM 40 MG/0.4ML ~~LOC~~ SOLN
40.0000 mg | SUBCUTANEOUS | Status: DC
  Administered 2019-10-17 – 2019-10-18 (×2): 40 mg via SUBCUTANEOUS
  Filled 2019-10-17 (×3): qty 0.4

## 2019-10-17 MED ORDER — MENTHOL 3 MG MT LOZG
1.0000 | LOZENGE | OROMUCOSAL | Status: DC | PRN
  Administered 2019-10-18: 3 mg via ORAL
  Filled 2019-10-17 (×3): qty 9

## 2019-10-17 MED ORDER — POTASSIUM CHLORIDE CRYS ER 20 MEQ PO TBCR
40.0000 meq | EXTENDED_RELEASE_TABLET | Freq: Once | ORAL | Status: AC
  Administered 2019-10-17: 40 meq via ORAL
  Filled 2019-10-17: qty 2

## 2019-10-17 NOTE — Progress Notes (Signed)
At bedside, current PIV working well with IVF running.  Spoke with Ashe=ley RN re Midline request.  States pt has been losing PIV daily, at times from her pulling them out herself or from ABT.  Vancomycin has been d/c'ed and now on Rocephin. Agree with Morrie Sheldon plan to  preserve veins at this time and not start midline or PIV until this current PIV no longer working.

## 2019-10-17 NOTE — Progress Notes (Signed)
Urology Inpatient Progress Note  Subjective: Teresa Davenport is a 54 y.o. female with PMH nephrolithiasis admitted on 10/13/2019 with a 5 mm distal left ureteral stone, s/p left ureteral stent placement with Dr. Jeffie Pollock with intraoperative findings notable for pyelonephrosis.  Postoperative period has been complicated by poor pain control, fever, and tachycardia requiring transfer to the ICU.  Follow-up KUB confirmed appropriate stent placement.  She is on culture appropriate antibiotics for UTI.  WBC count of 7.7 today.  Creatinine stable at 0.81.  Blood cultures pending with no growth at 2 days.  She remains afebrile.  She is sleeping upon rounds today and unable to contribute to HPI.  Anti-infectives: Anti-infectives (From admission, onward)   Start     Dose/Rate Route Frequency Ordered Stop   10/16/19 1400  cefTRIAXone (ROCEPHIN) 2 g in sodium chloride 0.9 % 100 mL IVPB     2 g 200 mL/hr over 30 Minutes Intravenous Every 24 hours 10/16/19 1248     10/16/19 0300  vancomycin (VANCOCIN) IVPB 1000 mg/200 mL premix  Status:  Discontinued     1,000 mg 200 mL/hr over 60 Minutes Intravenous Every 24 hours 10/15/19 0918 10/16/19 1248   10/15/19 1400  ceFEPIme (MAXIPIME) 2 g in sodium chloride 0.9 % 100 mL IVPB  Status:  Discontinued     2 g 200 mL/hr over 30 Minutes Intravenous Every 12 hours 10/15/19 0922 10/16/19 1248   10/15/19 0930  metroNIDAZOLE (FLAGYL) IVPB 500 mg  Status:  Discontinued     500 mg 100 mL/hr over 60 Minutes Intravenous Every 8 hours 10/15/19 0924 10/16/19 1248   10/15/19 0200  ceFEPIme (MAXIPIME) 2 g in sodium chloride 0.9 % 100 mL IVPB  Status:  Discontinued     2 g 200 mL/hr over 30 Minutes Intravenous Every 8 hours 10/15/19 0109 10/15/19 0922   10/15/19 0115  vancomycin (VANCOREADY) IVPB 1250 mg/250 mL     1,250 mg 166.7 mL/hr over 90 Minutes Intravenous  Once 10/15/19 0109 10/15/19 0427   10/14/19 1100  cefTRIAXone (ROCEPHIN) 1 g in sodium chloride 0.9 % 100 mL  IVPB  Status:  Discontinued     1 g 200 mL/hr over 30 Minutes Intravenous Every 24 hours 10/14/19 1018 10/15/19 0109   10/13/19 2000  ciprofloxacin (CIPRO) IVPB 400 mg  Status:  Discontinued     400 mg 200 mL/hr over 60 Minutes Intravenous Every 12 hours 10/13/19 1036 10/14/19 1018   10/13/19 0840  ciprofloxacin (CIPRO) 400 MG/200ML IVPB    Note to Pharmacy: Huffines, Mindy   : cabinet override      10/13/19 0840 10/13/19 0844   10/13/19 0000  ciprofloxacin (CIPRO) 500 MG tablet     500 mg Oral 2 times daily 10/13/19 0912 10/23/19 2359      Current Facility-Administered Medications  Medication Dose Route Frequency Provider Last Rate Last Admin  . acetaminophen (TYLENOL) tablet 650 mg  650 mg Oral Q4H PRN Irine Seal, MD   650 mg at 10/15/19 1027  . bisacodyl (DULCOLAX) suppository 10 mg  10 mg Rectal Daily PRN Irine Seal, MD      . cefTRIAXone (ROCEPHIN) 2 g in sodium chloride 0.9 % 100 mL IVPB  2 g Intravenous Q24H Enzo Bi, MD 200 mL/hr at 10/17/19 1708 2 g at 10/17/19 1708  . Chlorhexidine Gluconate Cloth 2 % PADS 6 each  6 each Topical Daily Lang Snow, NP   6 each at 10/17/19 1013  . diphenhydrAMINE (BENADRYL) injection 12.5-25  mg  12.5-25 mg Intravenous Q6H PRN Bjorn Pippin, MD   25 mg at 10/15/19 0306   Or  . diphenhydrAMINE (BENADRYL) 12.5 MG/5ML elixir 12.5-25 mg  12.5-25 mg Oral Q6H PRN Bjorn Pippin, MD   25 mg at 10/16/19 0047  . enoxaparin (LOVENOX) injection 40 mg  40 mg Subcutaneous Q24H Danford, Earl Lites, MD      . folic acid (FOLVITE) tablet 1 mg  1 mg Oral Daily Jimmye Norman, NP   1 mg at 10/17/19 1012  . ipratropium-albuterol (DUONEB) 0.5-2.5 (3) MG/3ML nebulizer solution 3 mL  3 mL Nebulization Q4H Darlin Priestly, MD   3 mL at 10/17/19 1543  . LORazepam (ATIVAN) tablet 1-4 mg  1-4 mg Oral Q1H PRN Jimmye Norman, NP   2 mg at 10/17/19 1519   Or  . LORazepam (ATIVAN) injection 1-4 mg  1-4 mg Intravenous Q1H PRN Jimmye Norman, NP       . menthol-cetylpyridinium (CEPACOL) lozenge 3 mg  1 lozenge Oral PRN Danford, Earl Lites, MD      . multivitamin with minerals tablet 1 tablet  1 tablet Oral Daily Jimmye Norman, NP   1 tablet at 10/17/19 0801  . nicotine (NICODERM CQ - dosed in mg/24 hours) patch 21 mg  21 mg Transdermal Daily Darlin Priestly, MD   21 mg at 10/16/19 1605  . opium-belladonna (B&O SUPPRETTES) 16.2-60 MG suppository 1 suppository  1 suppository Rectal Q6H PRN Bjorn Pippin, MD   1 suppository at 10/14/19 0929  . oxybutynin (DITROPAN) tablet 10 mg  10 mg Oral QID Darlin Priestly, MD   10 mg at 10/17/19 1719  . oxyCODONE (Oxy IR/ROXICODONE) immediate release tablet 10 mg  10 mg Oral Q4H PRN Darlin Priestly, MD   10 mg at 10/17/19 1719  . phenazopyridine (PYRIDIUM) tablet 200 mg  200 mg Oral TID PRN Bjorn Pippin, MD      . phenol (CHLORASEPTIC) mouth spray 1 spray  1 spray Mouth/Throat PRN Stoioff, Verna Czech, MD   1 spray at 10/17/19 1016  . promethazine (PHENERGAN) injection 12.5 mg  12.5 mg Intravenous Q4H PRN Bjorn Pippin, MD   12.5 mg at 10/15/19 1114  . senna-docusate (Senokot-S) tablet 1 tablet  1 tablet Oral QHS PRN Bjorn Pippin, MD      . sodium phosphate (FLEET) 7-19 GM/118ML enema 1 enema  1 enema Rectal Once PRN Bjorn Pippin, MD      . tamsulosin [facility]) capsule 0.4 mg  0.4 mg Oral Daily Stoioff, Scott C, MD   0.4 mg at 10/17/19 0802  . thiamine tablet 100 mg  100 mg Oral Daily Jimmye Norman, NP   100 mg at 10/17/19 0802   Objective: Vital signs in last 24 hours: Temp:  [97.9 F (36.6 C)-100.6 F (38.1 C)] 97.9 F (36.6 C) (04/08 1600) Pulse Rate:  [103-124] 124 (04/08 1700) Resp:  [13-31] 20 (04/08 1700) BP: (98-127)/(60-83) 102/65 (04/08 1600) SpO2:  [86 %-99 %] 86 % (04/08 1700)  Intake/Output from previous day: 04/07 0701 - 04/08 0700 In: 1931.9 [P.O.:480; I.V.:1251.9; IV Piggyback:200] Out: 3150 [Urine:3150] Intake/Output this shift: Total I/O In: 860 [P.O.:860] Out: 800  [Urine:800]  Physical Exam Vitals and nursing note reviewed.  Constitutional:      General: She is sleeping.  HENT:     Head: Normocephalic and atraumatic.  Pulmonary:     Effort: Pulmonary effort is normal. No respiratory distress.  Skin:    General: Skin is warm and  dry.    Lab Results:  Recent Labs    10/16/19 0834 10/17/19 0408  WBC 6.2 7.7  HGB 11.2* 11.7*  HCT 33.2* 34.6*  PLT 124* 129*   BMET Recent Labs    10/16/19 0834 10/17/19 0408  NA 132* 135  K 3.0* 3.1*  CL 96* 98  CO2 25 26  GLUCOSE 105* 90  BUN 7 9  CREATININE 0.76 0.81  CALCIUM 8.3* 8.5*   PT/INR Recent Labs    10/14/19 2116  LABPROT 12.7  INR 1.0   ABG Recent Labs    10/14/19 2043  PHART 7.45  HCO3 25.7   Assessment & Plan: 54 year old female with a history of nephrolithiasis s/p left ureteral stent placement for management of a 5 mm obstructing distal left ureteral stone.  On culture appropriate antibiotics for pyelonephrosis.  No new recommendations today.  We will plan for ureteroscopy with laser lithotripsy and stent exchange with Dr. Richardo Hanks on 10/25/2019.  Carman Ching, PA-C 10/17/2019

## 2019-10-17 NOTE — Progress Notes (Signed)
PROGRESS NOTE    Teresa Davenport  MPN:[id number] DOB: January 05, 1966 DOA: 10/13/2019 PCP: Armando Gang, FNP    Assessment & Plan:   Active Problems:   Left ureteral stone   Dyspnea on exertion   Hypoxia   Tachycardia    Teresa Davenport is an 54 y.o. Caucasian female with a history of nephrolithiasis, admitted to the urology service on 10/13/2019, with pain from obstructive calculus, postop day 1 of left ureteral stent placement with noted pyelonephrosis during the procedure, and kept inpatient for antibiotic treatment, now on Rocephin and Cipro.  Patient continues to have flank pain following her procedure. She has been having shortness of breath with minimal exertion.  She continues to complain of severe left flank pain with minimal relief with as needed oxycodone and Dilaudid.  Hospitalist was consulted on 10/14/19, and offered to take over as primary on 10/15/19.  # Severe left flank pain 2/2 # Left ureteral lithiasis and  # severe left hydronephrosis and hydroureter S/p stent placement 10/13/2019 # 18 mm calculus inferior pole LEFT kidney Flank pain is unchanged PLAN: -Continue Flomax and oxybutynin -Continue antibiotics   --Will need to defer definitive stone treatment until at least next week with active infection --Oxycodone 10 mg q4h PRN --d/c IV dilaudid 1 mg q3h PRN   Sepsis 2/2 Pyelonephritis  On 4/5, pt developed sepsis with WBC 14.2, fever, tachycardia.  Recent instrumentation.  Blood cx obtained on 4/6  -urine cx grew E coli  PLAN: Stop ceftriaxone, start cefazolin    Dyspnea on exertion Hypoxia Hx of COPD, does not appear to be in exacerbation      Confusion and hallucination Daily alcohol drinker Complicated alcohol withdrawal Patient scoring over 20 on CIWA overnight numerous times. -Continue stepdown CIWA protocol   DVT prophylaxis: SCD/Compression stockings Code Status: Full code  Family Communication:  Disposition Plan:  Patient still  having active hallucinations, agitation, and disorientation due to alcohol withdrawal delirium   Subjective and Interval History:  Patient remains confused and hallucinating.  Still has flank pain, vomiting.  Mild fever overnight.     Objective: Vitals:   10/17/19 1300 10/17/19 1400 10/17/19 1600 10/17/19 1700  BP:   102/65   Pulse: (!) 114 (!) 109 (!) 116 (!) 124  Resp: (!) 29 (!) 26 (!) 31 20  Temp:   97.9 F (36.6 C)   TempSrc:   Oral   SpO2: (!) 88% 92% 92% (!) 86%  Weight:      Height:        Intake/Output Summary (Last 24 hours) at 10/17/2019 1717 Last data filed at 10/17/2019 1708 Gross per 24 hour  Intake 1157.53 ml  Output 1850 ml  Net -692.47 ml   Filed Weights   10/13/19 0530  Weight: 63 kg    Examination:   General appearance: Well-nourished adult female, arousable and in moderate distress.  From pain. HEENT: Anicteric, conjunctiva pink, lids and lashes normal. No nasal deformity, discharge, epistaxis.  Lips moist, teeth normal. OP normal, no oral lesions.   Skin: Warm and dry.  No suspicious rashes or lesions. Cardiac: RRR, no murmurs appreciated.  No LE edema.    Respiratory: Normal respiratory rate and rhythm.  CTAB without rales or wheezes. Abdomen: Abdomen soft.  Left flank and abdomen pain. No ascites, distension, hepatosplenomegaly.   MSK: No deformities or effusions of the large joints of the upper or lower extremities bilaterally. Neuro: Sleeping but arousable.  All strength in all 4 extremities seems  normal.  Patient still somewhat confused and disoriented. Psych:     Data Reviewed: I have personally reviewed following labs and imaging studies  CBC: Recent Labs  Lab 10/12/19 1408 10/12/19 1408 10/14/19 1042 10/14/19 2116 10/15/19 0044 10/16/19 0834 10/17/19 0408  WBC 9.6   < > 14.2* 9.4 6.3 6.2 7.7  NEUTROABS 8.6*  --   --   --  5.6  --   --   HGB 14.4   < > 11.4* 12.3 12.0 11.2* 11.7*  HCT 43.7   < > 35.0* 38.7 36.2 33.2* 34.6*  MCV  93.4   < > 94.9 97.7 93.1 91.2 89.4  PLT 254   < > 138* 121* 121* 124* 129*   < > = values in this interval not displayed.   Basic Metabolic Panel: Recent Labs  Lab 10/14/19 1042 10/14/19 2116 10/15/19 0044 10/16/19 0834 10/17/19 0408  NA 136 135 133* 132* 135  K 4.1 4.2 3.4* 3.0* 3.1*  CL 104 101 97* 96* 98  CO2 25 24 23 25 26   GLUCOSE 116* 118* 135* 105* 90  BUN 14 10 10 7 9   CREATININE 0.77 0.87 0.98 0.76 0.81  CALCIUM 8.8* 8.7* 8.8* 8.3* 8.5*  MG  --   --   --  1.7 1.7   GFR: Estimated Creatinine Clearance: 70.1 mL/min (by C-G formula based on SCr of 0.81 mg/dL). Liver Function Tests: Recent Labs  Lab 10/12/19 1408 10/15/19 0044  AST 40 40  ALT 29 39  ALKPHOS 83 115  BILITOT 0.8 1.3*  PROT 7.7 6.9  ALBUMIN 4.4 3.7   Recent Labs  Lab 10/12/19 1408  LIPASE 24   No results for input(s): AMMONIA in the last 168 hours. Coagulation Profile: Recent Labs  Lab 10/14/19 2116  INR 1.0   Cardiac Enzymes: No results for input(s): CKTOTAL, CKMB, CKMBINDEX, TROPONINI in the last 168 hours. BNP (last 3 results) No results for input(s): PROBNP in the last 8760 hours. HbA1C: No results for input(s): HGBA1C in the last 72 hours. CBG: No results for input(s): GLUCAP in the last 168 hours. Lipid Profile: No results for input(s): CHOL, HDL, LDLCALC, TRIG, CHOLHDL, LDLDIRECT in the last 72 hours. Thyroid Function Tests: Recent Labs    10/14/19 2116  TSH 1.801   Anemia Panel: No results for input(s): VITAMINB12, FOLATE, FERRITIN, TIBC, IRON, RETICCTPCT in the last 72 hours. Sepsis Labs: Recent Labs  Lab 10/14/19 2116 10/15/19 0044 10/15/19 0842  LATICACIDVEN 1.3 1.2 1.4    Recent Results (from the past 240 hour(s))  Anaerobic culture     Status: None (Preliminary result)   Collection Time: 10/13/19  5:25 AM   Specimen: PATH Other; Tissue  Result Value Ref Range Status   Specimen Description   Final    URINE, RANDOM Performed at [facility], [address], [place], [address] [place]    Special Requests   Final    NONE Performed at [facility], [address] [place], [address] [place]    Culture   Final    NO ANAEROBES ISOLATED; CULTURE IN PROGRESS FOR 5 DAYS   Report Status PENDING  Incomplete  Urine Culture     Status: Abnormal   Collection Time: 10/13/19  5:25 AM   Specimen: Urine, Random  Result Value Ref Range Status   Specimen Description URINE, RANDOM  Final   Special Requests NONE  Final   Culture >=100,000 COLONIES/mL ESCHERICHIA COLI (A)  Final   Report  Status 10/15/2019 FINAL  Final   Organism ID, Bacteria ESCHERICHIA COLI (A)  Final      Susceptibility   Escherichia coli - MIC*    AMPICILLIN >=32 RESISTANT Resistant     CEFAZOLIN <=4 SENSITIVE Sensitive     CEFTRIAXONE <=0.25 SENSITIVE Sensitive     CIPROFLOXACIN >=4 RESISTANT Resistant     GENTAMICIN <=1 SENSITIVE Sensitive     IMIPENEM <=0.25 SENSITIVE Sensitive     NITROFURANTOIN 64 INTERMEDIATE Intermediate     TRIMETH/SULFA >=320 RESISTANT Resistant     AMPICILLIN/SULBACTAM >=32 RESISTANT Resistant     PIP/TAZO <=4 SENSITIVE Sensitive     * >=100,000 COLONIES/mL ESCHERICHIA COLI  Respiratory Panel by RT PCR (Flu A&B, Covid) - Nasopharyngeal Swab     Status: None   Collection Time: 10/13/19  6:33 AM   Specimen: Nasopharyngeal Swab  Result Value Ref Range Status   SARS Coronavirus 2 by RT PCR NEGATIVE NEGATIVE Final    Comment: (NOTE) SARS-CoV-2 target nucleic acids are NOT DETECTED. The SARS-CoV-2 RNA is generally detectable in upper respiratoy specimens during the acute phase of infection. The lowest concentration of SARS-CoV-2 viral copies this assay can detect is 131 copies/mL. A negative result does not preclude SARS-Cov-2 infection and should not be used as the sole basis for treatment or other patient management decisions. A negative result may occur with  improper specimen collection/handling, submission of specimen other than  nasopharyngeal swab, presence of viral mutation(s) within the areas targeted by this assay, and inadequate number of viral copies (<131 copies/mL). A negative result must be combined with clinical observations, patient history, and epidemiological information. The expected result is Negative. Fact Sheet for Patients:  https://www.moore.com/https://www.fda.gov/media/142436/download Fact Sheet for Healthcare Providers:  https://www.young.biz/https://www.fda.gov/media/142435/download This test is not yet ap proved or cleared by the [place] FDA and  has been authorized for detection and/or diagnosis of SARS-CoV-2 by FDA under an Emergency Use Authorization (EUA). This EUA will remain  in effect (meaning this test can be used) for the duration of the COVID-19 declaration under Section 564(b)(1) of the Act, 21 U.S.C. section 360bbb-3(b)(1), unless the authorization is terminated or revoked sooner.    Influenza A by PCR NEGATIVE NEGATIVE Final   Influenza B by PCR NEGATIVE NEGATIVE Final    Comment: (NOTE) The Xpert Xpress SARS-CoV-2/FLU/RSV assay is intended as an aid in  the diagnosis of influenza from Nasopharyngeal swab specimens and  should not be used as a sole basis for treatment. Nasal washings and  aspirates are unacceptable for Xpert Xpress SARS-CoV-2/FLU/RSV  testing. Fact Sheet for Patients: https://www.moore.com/https://www.fda.gov/media/142436/download Fact Sheet for Healthcare Providers: https://www.young.biz/https://www.fda.gov/media/142435/download This test is not yet approved or cleared by the [place] FDA and  has been authorized for detection and/or diagnosis of SARS-CoV-2 by  FDA under an Emergency Use Authorization (EUA). This EUA will remain  in effect (meaning this test can be used) for the duration of the  Covid-19 declaration under Section 564(b)(1) of the Act, 21  U.S.C. section 360bbb-3(b)(1), unless the authorization is  terminated or revoked. Performed at [facility], [address] [place], [place] [address]    CULTURE, BLOOD (ROUTINE X 2) w Reflex to ID Panel     Status: None (Preliminary result)   Collection Time: 10/15/19 12:44 AM   Specimen: BLOOD  Result Value Ref Range Status   Specimen Description BLOOD BLOOD RIGHT HAND  Final   Special Requests   Final    BOTTLES DRAWN AEROBIC AND ANAEROBIC Blood Culture adequate  volume   Culture   Final    NO GROWTH 2 DAYS Performed at [facility], [address] [place], [place] [address]    Report Status PENDING  Incomplete  CULTURE, BLOOD (ROUTINE X 2) w Reflex to ID Panel     Status: None (Preliminary result)   Collection Time: 10/15/19 12:56 AM   Specimen: BLOOD  Result Value Ref Range Status   Specimen Description BLOOD LEFT ANTECUBITAL  Final   Special Requests   Final    BOTTLES DRAWN AEROBIC AND ANAEROBIC Blood Culture adequate volume   Culture   Final    NO GROWTH 2 DAYS Performed at [facility], [address], [place], [place] [address]    Report Status PENDING  Incomplete  MRSA PCR Screening     Status: None   Collection Time: 10/16/19  8:15 AM   Specimen: Nasopharyngeal  Result Value Ref Range Status   MRSA by PCR NEGATIVE NEGATIVE Final    Comment:        The GeneXpert MRSA Assay (FDA approved for NASAL specimens only), is one component of a comprehensive MRSA colonization surveillance program. It is not intended to diagnose MRSA infection nor to guide or monitor treatment for MRSA infections. Performed at [facility], [address] [place], [place] [address]       Radiology Studies: CT HEAD WO CONTRAST  Result Date: 10/16/2019 CLINICAL DATA:  Initial evaluation for acute encephalopathy. EXAM: CT HEAD WITHOUT CONTRAST TECHNIQUE: Contiguous axial images were obtained from the base of the skull through the vertex without intravenous contrast. COMPARISON:  None available. FINDINGS: Brain: Cerebral volume within normal limits for patient age. No evidence for acute intracranial hemorrhage.  No findings to suggest acute large vessel territory infarct. No mass lesion, midline shift, or mass effect. Ventricles are normal in size without evidence for hydrocephalus. No extra-axial fluid collection identified. Vascular: No hyperdense vessel identified. Skull: Scalp soft tissues demonstrate no acute abnormality. Calvarium intact. Sinuses/Orbits: Globes and orbital soft tissues within normal limits. Visualized paranasal sinuses are clear. No mastoid effusion. IMPRESSION: Negative head CT.  No acute intracranial abnormality identified. Electronically Signed   By: Jeannine Boga M.D.   On: 10/16/2019 03:35   US RENAL  Result Date: 10/16/2019 CLINICAL DATA:  Hydronephrosis EXAM: RENAL / URINARY TRACT ULTRASOUND COMPLETE COMPARISON:  Renal ultrasound 09/02/2016 FINDINGS: Right Kidney: Renal measurements: 12.9 x 4.8 x 6.0 cm = volume: 196 mL. Cortical thinning. Normal cortical echogenicity. No mass, hydronephrosis or shadowing calcification. Left Kidney: Renal measurements: 11.7 x 4.3 x 5.5 cm = volume: 144 mL. Cortical thinning. Upper normal cortical echogenicity. Mild LEFT hydronephrosis. No renal mass lesion. Shadowing calcification identified at inferior pole 18 mm diameter. No perinephric fluid. Bladder: Well distended, normal appearance. Other: N/A IMPRESSION: Mild LEFT hydronephrosis. 18 mm shadowing calculus inferior pole LEFT kidney. Electronically Signed   By: Lavonia Dana M.D.   On: 10/16/2019 09:27     Scheduled Meds: . Chlorhexidine Gluconate Cloth  6 each Topical Daily  . enoxaparin (LOVENOX) injection  40 mg Subcutaneous Q24H  . folic acid  1 mg Oral Daily  . ipratropium-albuterol  3 mL Nebulization Q4H  . multivitamin with minerals  1 tablet Oral Daily  . nicotine  21 mg Transdermal Daily  . oxybutynin  10 mg Oral QID  . tamsulosin  0.4 mg Oral Daily  . thiamine  100 mg Oral Daily   Continuous Infusions: . cefTRIAXone (ROCEPHIN)  IV 2 g (10/17/19  1708)     LOS: 2 days      Alberteen Sam, MD [facility] If 7PM-7AM, please contact night-coverage 10/17/2019, 5:17 PM

## 2019-10-18 ENCOUNTER — Encounter: Payer: Self-pay | Admitting: Urology

## 2019-10-18 ENCOUNTER — Inpatient Hospital Stay: Payer: Medicaid Other

## 2019-10-18 LAB — BASIC METABOLIC PANEL
Anion gap: 12 (ref 5–15)
BUN: 13 mg/dL (ref 6–20)
CO2: 28 mmol/L (ref 22–32)
Calcium: 8.9 mg/dL (ref 8.9–10.3)
Chloride: 96 mmol/L — ABNORMAL LOW (ref 98–111)
Creatinine, Ser: 0.63 mg/dL (ref 0.44–1.00)
GFR calc Af Amer: >60 mL/min (ref >60)
GFR calc non Af Amer: >60 mL/min (ref >60)
Glucose, Bld: 90 mg/dL (ref 70–99)
Potassium: 2.9 mmol/L — ABNORMAL LOW (ref 3.5–5.1)
Sodium: 136 mmol/L (ref 135–145)

## 2019-10-18 LAB — CBC
HCT: 34.3 % — ABNORMAL LOW (ref 36.0–46.0)
Hemoglobin: 11.4 g/dL — ABNORMAL LOW (ref 12.0–15.0)
MCH: 30.4 pg (ref 26.0–34.0)
MCHC: 33.2 g/dL (ref 30.0–36.0)
MCV: 91.5 fL (ref 80.0–100.0)
Platelets: 148 10*3/uL — ABNORMAL LOW (ref 150–400)
RBC: 3.75 MIL/uL — ABNORMAL LOW (ref 3.87–5.11)
RDW: 14 % (ref 11.5–15.5)
WBC: 7.4 10*3/uL (ref 4.0–10.5)
nRBC: 0 % (ref 0.0–0.2)

## 2019-10-18 LAB — MAGNESIUM: Magnesium: 1.9 mg/dL (ref 1.7–2.4)

## 2019-10-18 LAB — ANAEROBIC CULTURE

## 2019-10-18 LAB — FIBRIN DERIVATIVES D-DIMER (ARMC ONLY): Fibrin derivatives D-dimer (ARMC): 2765.33 ng/mL (FEU) — ABNORMAL HIGH (ref 0.00–499.00)

## 2019-10-18 LAB — LACTIC ACID, PLASMA: Lactic Acid, Venous: 0.7 mmol/L (ref 0.5–1.9)

## 2019-10-18 MED ORDER — HYDROMORPHONE HCL 1 MG/ML IJ SOLN
0.5000 mg | Freq: Once | INTRAMUSCULAR | Status: AC
  Administered 2019-10-18: 0.5 mg via INTRAVENOUS
  Filled 2019-10-18: qty 1

## 2019-10-18 MED ORDER — POTASSIUM CHLORIDE CRYS ER 20 MEQ PO TBCR
20.0000 meq | EXTENDED_RELEASE_TABLET | Freq: Three times a day (TID) | ORAL | Status: AC
  Administered 2019-10-18 (×3): 20 meq via ORAL
  Filled 2019-10-18 (×3): qty 1

## 2019-10-18 MED ORDER — SODIUM CHLORIDE 0.9 % IV BOLUS
500.0000 mL | Freq: Once | INTRAVENOUS | Status: AC
  Administered 2019-10-18: 500 mL via INTRAVENOUS

## 2019-10-18 MED ORDER — LORAZEPAM 1 MG PO TABS
1.0000 mg | ORAL_TABLET | ORAL | Status: DC | PRN
  Administered 2019-10-19: 1 mg via ORAL
  Filled 2019-10-18: qty 1

## 2019-10-18 MED ORDER — SODIUM CHLORIDE 0.9 % IV SOLN
2.0000 g | INTRAVENOUS | Status: DC
  Administered 2019-10-18 – 2019-10-19 (×2): 2 g via INTRAVENOUS
  Filled 2019-10-18: qty 20
  Filled 2019-10-18 (×2): qty 2
  Filled 2019-10-18: qty 20

## 2019-10-18 MED ORDER — IOHEXOL 300 MG/ML  SOLN
100.0000 mL | Freq: Once | INTRAMUSCULAR | Status: AC | PRN
  Administered 2019-10-18: 100 mL via INTRAVENOUS

## 2019-10-18 MED ORDER — ACETAMINOPHEN 500 MG PO TABS
1000.0000 mg | ORAL_TABLET | Freq: Three times a day (TID) | ORAL | Status: DC
  Administered 2019-10-18 – 2019-10-20 (×6): 1000 mg via ORAL
  Filled 2019-10-18 (×6): qty 2

## 2019-10-18 MED ORDER — HALOPERIDOL LACTATE 5 MG/ML IJ SOLN
5.0000 mg | Freq: Once | INTRAMUSCULAR | Status: AC
  Administered 2019-10-18: 02:00:00 5 mg via INTRAVENOUS

## 2019-10-18 MED ORDER — MAGNESIUM OXIDE 400 (241.3 MG) MG PO TABS
400.0000 mg | ORAL_TABLET | Freq: Once | ORAL | Status: AC
  Administered 2019-10-18: 05:00:00 400 mg via ORAL
  Filled 2019-10-18: qty 1

## 2019-10-18 MED ORDER — LORAZEPAM 2 MG/ML IJ SOLN
1.0000 mg | INTRAMUSCULAR | Status: DC | PRN
  Administered 2019-10-18: 1 mg via INTRAVENOUS
  Filled 2019-10-18: qty 1

## 2019-10-18 MED ORDER — IPRATROPIUM-ALBUTEROL 0.5-2.5 (3) MG/3ML IN SOLN
3.0000 mL | Freq: Three times a day (TID) | RESPIRATORY_TRACT | Status: DC
  Administered 2019-10-18 – 2019-10-19 (×4): 3 mL via RESPIRATORY_TRACT
  Filled 2019-10-18 (×5): qty 3

## 2019-10-18 MED ORDER — IOHEXOL 9 MG/ML PO SOLN
500.0000 mL | ORAL | Status: AC
  Administered 2019-10-18 (×2): 500 mL via ORAL

## 2019-10-18 MED ORDER — SODIUM CHLORIDE 0.9 % IV SOLN: 1.0000 g | INTRAVENOUS | Status: DC

## 2019-10-18 MED ORDER — HALOPERIDOL LACTATE 5 MG/ML IJ SOLN
INTRAMUSCULAR | Status: AC
  Filled 2019-10-18: qty 1

## 2019-10-18 NOTE — Progress Notes (Signed)
Pt arrived back to unit at this time from CT scan. VSS on 2L [place].

## 2019-10-18 NOTE — Progress Notes (Signed)
Pt had a severity score of 7 on th RT protocol assessement. The nebulizer treatments are to be changed to TID and prn.

## 2019-10-18 NOTE — Progress Notes (Signed)
Pt off unit at this time for CT scan. Order received from Dr. Maryfrances Bunnell that pt can go to CT without ICU RN. VSS on 2L [place].

## 2019-10-18 NOTE — Progress Notes (Signed)
PROGRESS NOTE    Teresa Davenport  CWC:[id number] DOB: Oct 29, 1965 DOA: 10/13/2019 PCP: Armando Gang, FNP    Assessment & Plan:   Active Problems:   Left ureteral stone   Dyspnea on exertion   Hypoxia   Tachycardia    Teresa Davenport is an 54 y.o. Caucasian female with a history of nephrolithiasis, admitted to the urology service on 10/13/2019, with pain from obstructive calculus, postop day 1 of left ureteral stent placement with noted pyelonephrosis during the procedure, and kept inpatient for antibiotic treatment, now on Rocephin and Cipro.  Patient continues to have flank pain following her procedure. She has been having shortness of breath with minimal exertion.  She continues to complain of severe left flank pain with minimal relief with as needed oxycodone and Dilaudid.  Hospitalist was consulted on 10/14/19, and offered to take over as primary on 10/15/19.  # Severe left flank pain 2/2 # Left ureteral lithiasis and  # severe left hydronephrosis and hydroureter S/p stent placement 10/13/2019 # 18 mm calculus inferior pole LEFT kidney Still has abodminal pain   -Continue ceftriaxone -Continue Flomax and oxybutynin -Obtain CT abdomen and pelvis with contrast    --Will need to defer definitive stone treatment until at least next week with active infection --Oxycodone 10 mg q4h PRN    Sepsis 2/2 Pyelonephritis  On 4/5, pt developed sepsis with WBC 14.2, fever, tachycardia.  Recent instrumentation.  Blood cx obtained on 4/6  -urine cx grew E coli   -Continue ceftriaxone    Dyspnea on exertion Hypoxia Hx of COPD, does not appear to be in exacerbation      Confusion and hallucination Daily alcohol drinker Complicated alcohol withdrawal Patient CIWA scoring improving.  Mentation more clear today. -Continue CIWA, transition to floor regimen   DVT prophylaxis: Lovenox Code Status: Full code  Family Communication:  Disposition Plan:  Status is:  Inpatient  Remains inpatient appropriate because:Altered mental status and Inpatient level of care appropriate due to severity of illness   Dispo: The patient is from: Home              Anticipated d/c is to: Home              Anticipated d/c date is: 3 days              Patient currently is not medically stable to d/c.         Subjective and Interval History:  No looseness during the day today.  Flank pain still severe.  No vomiting today.  Less confusion.   Objective: Vitals:   10/18/19 1549 10/18/19 1600 10/18/19 1640 10/18/19 1700  BP:  92/62  (!) 85/56  Pulse: (!) 101 98 97 95  Resp: 20 (!) 26 19 19   Temp:      TempSrc:      SpO2: 92% 94% 92% 96%  Weight:      Height:        Intake/Output Summary (Last 24 hours) at 10/18/2019 1840 Last data filed at 10/18/2019 1332 Gross per 24 hour  Intake 626.02 ml  Output 1100 ml  Net -473.98 ml   Filed Weights   10/13/19 0530  Weight: 63 kg    Examination:   General appearance: Well-nourished adult female, lying in bed, no acute distress, but appears tired, in pain   HEENT: Anicteric, conjunctiva pink, lids and lashes normal. No nasal deformity, discharge, epistaxis.  Lips moist, teeth normal. OP normal, no oral lesions.  Skin: Warm and dry.  No suspicious rashes or lesions. Cardiac: RRR, no murmurs, no lower extremity edema.    Respiratory: Normal respiratory rate and rhythm, lungs clear without rales or wheezes Abdomen: Soft, left flank and abdomen pain, no ascites or distention. MSK: No deformities or effusions of the large joints of the upper or lower extremities bilaterally. Neuro: Sleeping but arousable, strength in all 4 extremities seems normal.  Somnolent diminished attention, but oriented to person, place, and time.  Speech fluent.  Extraocular movements intact. Psych: Attention diminished, affect blunted, judgment insight appear normal.    Data Reviewed: I have personally reviewed following labs and imaging  studies  CBC: Recent Labs  Lab 10/12/19 1408 10/14/19 1042 10/14/19 2116 10/15/19 0044 10/16/19 0834 10/17/19 0408 10/18/19 0238  WBC 9.6   < > 9.4 6.3 6.2 7.7 7.4  NEUTROABS 8.6*  --   --  5.6  --   --   --   HGB 14.4   < > 12.3 12.0 11.2* 11.7* 11.4*  HCT 43.7   < > 38.7 36.2 33.2* 34.6* 34.3*  MCV 93.4   < > 97.7 93.1 91.2 89.4 91.5  PLT 254   < > 121* 121* 124* 129* 148*   < > = values in this interval not displayed.   Basic Metabolic Panel: Recent Labs  Lab 10/14/19 2116 10/15/19 0044 10/16/19 0834 10/17/19 0408 10/18/19 0238  NA 135 133* 132* 135 136  K 4.2 3.4* 3.0* 3.1* 2.9*  CL 101 97* 96* 98 96*  CO2 24 23 25 26 28   GLUCOSE 118* 135* 105* 90 90  BUN 10 10 7 9 13   CREATININE 0.87 0.98 0.76 0.81 0.63  CALCIUM 8.7* 8.8* 8.3* 8.5* 8.9  MG  --   --  1.7 1.7 1.9   GFR: Estimated Creatinine Clearance: 71 mL/min (by C-G formula based on SCr of 0.63 mg/dL). Liver Function Tests: Recent Labs  Lab 10/12/19 1408 10/15/19 0044  AST 40 40  ALT 29 39  ALKPHOS 83 115  BILITOT 0.8 1.3*  PROT 7.7 6.9  ALBUMIN 4.4 3.7   Recent Labs  Lab 10/12/19 1408  LIPASE 24   No results for input(s): AMMONIA in the last 168 hours. Coagulation Profile: Recent Labs  Lab 10/14/19 2116  INR 1.0   Cardiac Enzymes: No results for input(s): CKTOTAL, CKMB, CKMBINDEX, TROPONINI in the last 168 hours. BNP (last 3 results) No results for input(s): PROBNP in the last 8760 hours. HbA1C: No results for input(s): HGBA1C in the last 72 hours. CBG: No results for input(s): GLUCAP in the last 168 hours. Lipid Profile: No results for input(s): CHOL, HDL, LDLCALC, TRIG, CHOLHDL, LDLDIRECT in the last 72 hours. Thyroid Function Tests: No results for input(s): TSH, T4TOTAL, FREET4, T3FREE, THYROIDAB in the last 72 hours. Anemia Panel: No results for input(s): VITAMINB12, FOLATE, FERRITIN, TIBC, IRON, RETICCTPCT in the last 72 hours. Sepsis Labs: Recent Labs  Lab 10/14/19 2116  10/15/19 0044 10/15/19 0842 10/18/19 0944  LATICACIDVEN 1.3 1.2 1.4 0.7    Recent Results (from the past 240 hour(s))  Anaerobic culture     Status: None   Collection Time: 10/13/19  5:25 AM   Specimen: PATH Other; Tissue  Result Value Ref Range Status   Specimen Description   Final    URINE, RANDOM Performed at [facility], [address], [place], [place] [address]    Special Requests   Final    NONE Performed at [facility],  [address] [place], [place] [address]    Culture   Final    NO ANAEROBES ISOLATED Performed at [facility], [address], [place], [place] [address]    Report Status 10/18/2019 FINAL  Final  Urine Culture     Status: Abnormal   Collection Time: 10/13/19  5:25 AM   Specimen: Urine, Random  Result Value Ref Range Status   Specimen Description URINE, RANDOM  Final   Special Requests NONE  Final   Culture >=100,000 COLONIES/mL ESCHERICHIA COLI (A)  Final   Report Status 10/15/2019 FINAL  Final   Organism ID, Bacteria ESCHERICHIA COLI (A)  Final      Susceptibility   Escherichia coli - MIC*    AMPICILLIN >=32 RESISTANT Resistant     CEFAZOLIN <=4 SENSITIVE Sensitive     CEFTRIAXONE <=0.25 SENSITIVE Sensitive     CIPROFLOXACIN >=4 RESISTANT Resistant     GENTAMICIN <=1 SENSITIVE Sensitive     IMIPENEM <=0.25 SENSITIVE Sensitive     NITROFURANTOIN 64 INTERMEDIATE Intermediate     TRIMETH/SULFA >=320 RESISTANT Resistant     AMPICILLIN/SULBACTAM >=32 RESISTANT Resistant     PIP/TAZO <=4 SENSITIVE Sensitive     * >=100,000 COLONIES/mL ESCHERICHIA COLI  Respiratory Panel by RT PCR (Flu A&B, Covid) - Nasopharyngeal Swab     Status: None   Collection Time: 10/13/19  6:33 AM   Specimen: Nasopharyngeal Swab  Result Value Ref Range Status   SARS Coronavirus 2 by RT PCR NEGATIVE NEGATIVE Final    Comment: (NOTE) SARS-CoV-2 target nucleic acids are NOT DETECTED. The SARS-CoV-2 RNA is generally detectable in upper  respiratoy specimens during the acute phase of infection. The lowest concentration of SARS-CoV-2 viral copies this assay can detect is 131 copies/mL. A negative result does not preclude SARS-Cov-2 infection and should not be used as the sole basis for treatment or other patient management decisions. A negative result may occur with  improper specimen collection/handling, submission of specimen other than nasopharyngeal swab, presence of viral mutation(s) within the areas targeted by this assay, and inadequate number of viral copies (<131 copies/mL). A negative result must be combined with clinical observations, patient history, and epidemiological information. The expected result is Negative. Fact Sheet for Patients:  https://www.moore.com/ Fact Sheet for Healthcare Providers:  https://www.young.biz/ This test is not yet ap proved or cleared by the [place] FDA and  has been authorized for detection and/or diagnosis of SARS-CoV-2 by FDA under an Emergency Use Authorization (EUA). This EUA will remain  in effect (meaning this test can be used) for the duration of the COVID-19 declaration under Section 564(b)(1) of the Act, 21 U.S.C. section 360bbb-3(b)(1), unless the authorization is terminated or revoked sooner.    Influenza A by PCR NEGATIVE NEGATIVE Final   Influenza B by PCR NEGATIVE NEGATIVE Final    Comment: (NOTE) The Xpert Xpress SARS-CoV-2/FLU/RSV assay is intended as an aid in  the diagnosis of influenza from Nasopharyngeal swab specimens and  should not be used as a sole basis for treatment. Nasal washings and  aspirates are unacceptable for Xpert Xpress SARS-CoV-2/FLU/RSV  testing. Fact Sheet for Patients: https://www.moore.com/ Fact Sheet for Healthcare Providers: https://www.young.biz/ This test is not yet approved or cleared by the [place] FDA and  has been authorized for  detection and/or diagnosis of SARS-CoV-2 by  FDA under an Emergency Use Authorization (EUA). This EUA will remain  in effect (meaning this test can be used) for the duration of the  Covid-19 declaration under Section 564(b)(1) of the Act, 21  U.S.C. section 360bbb-3(b)(1), unless the authorization is  terminated or revoked. Performed at [facility], [address] [place], [place] [address]   CULTURE, BLOOD (ROUTINE X 2) w Reflex to ID Panel     Status: None (Preliminary result)   Collection Time: 10/15/19 12:44 AM   Specimen: BLOOD  Result Value Ref Range Status   Specimen Description BLOOD BLOOD RIGHT HAND  Final   Special Requests   Final    BOTTLES DRAWN AEROBIC AND ANAEROBIC Blood Culture adequate volume   Culture   Final    NO GROWTH 2 DAYS Performed at [facility], [address] [place], [place] [address]    Report Status PENDING  Incomplete  CULTURE, BLOOD (ROUTINE X 2) w Reflex to ID Panel     Status: None (Preliminary result)   Collection Time: 10/15/19 12:56 AM   Specimen: BLOOD  Result Value Ref Range Status   Specimen Description BLOOD LEFT ANTECUBITAL  Final   Special Requests   Final    BOTTLES DRAWN AEROBIC AND ANAEROBIC Blood Culture adequate volume   Culture   Final    NO GROWTH 2 DAYS Performed at [facility], [address] [place], [place] [address]    Report Status PENDING  Incomplete  MRSA PCR Screening     Status: None   Collection Time: 10/16/19  8:15 AM   Specimen: Nasopharyngeal  Result Value Ref Range Status   MRSA by PCR NEGATIVE NEGATIVE Final    Comment:        The GeneXpert MRSA Assay (FDA approved for NASAL specimens only), is one component of a comprehensive MRSA colonization surveillance program. It is not intended to diagnose MRSA infection nor to guide or monitor treatment for MRSA infections. Performed at [facility], [address] [place], [place] [address]       Radiology  Studies: CT ABDOMEN PELVIS W CONTRAST  Result Date: 10/18/2019 CLINICAL DATA:  History of nephrolithiasis EXAM: CT ABDOMEN AND PELVIS WITH CONTRAST TECHNIQUE: Multidetector CT imaging of the abdomen and pelvis was performed using the standard protocol following bolus administration of intravenous contrast. CONTRAST:  100mL OMNIPAQUE IOHEXOL 300 MG/ML  SOLN COMPARISON:  10/12/2019 FINDINGS: Lower chest: Lung bases demonstrate new bibasilar consolidation. Calcified granuloma is noted on the left. Hepatobiliary: No focal liver abnormality is seen. No gallstones, gallbladder wall thickening, or biliary dilatation. Pancreas: Unremarkable. No pancreatic ductal dilatation or surrounding inflammatory changes. Spleen: Normal in size without focal abnormality. Adrenals/Urinary Tract: Adrenal glands are within normal limits bilaterally. Right kidney demonstrates shows a small 2 mm lower pole renal stone. Left ureteral stent is noted in place. The previously seen large lower pole renal stone is again noted and stable. The previously seen left ureteral stone is again identified in the distal ureter stable in appearance from the prior exam. Persistent perinephric stranding is noted on the left. Some patchy areas of decreased enhancement are noted consistent with pyelonephritis. These persist on delayed images as well. The bladder is well distended. Stomach/Bowel: Colon is well visualized without obstructive or inflammatory change. The appendix is not well visualized although no inflammatory changes to suggest appendicitis are noted. No small bowel or gastric abnormality is seen. Vascular/Lymphatic: Aortic atherosclerotic changes are noted without aneurysmal dilatation. There are periaortic lymph nodes identified best seen on image number 34 of series 2 near the left renal hilum. These are felt to be reactive and have increased  in size somewhat in the interval from the prior exam. The largest of these measures 11 mm in short  axis. Reproductive: Status post hysterectomy. No adnexal masses. Other: No abdominal wall hernia or abnormality. No abdominopelvic ascites. Musculoskeletal: No acute or significant osseous findings. IMPRESSION: Left ureteral stent is now seen in satisfactory position. Left ureteral stone is again identified adjacent to the stent stable from the prior study. Large left renal stone is again seen. Changes in the left kidney consistent with pyelonephritis. Tiny nonobstructing right renal stone. Peri aortic lymphadenopathy likely related to the underlying renal infection. New bibasilar consolidation. Electronically Signed   By: Alcide Clever M.D.   On: 10/18/2019 11:46     Scheduled Meds:  acetaminophen  1,000 mg Oral TID   enoxaparin (LOVENOX) injection  40 mg Subcutaneous Q24H   folic acid  1 mg Oral Daily   ipratropium-albuterol  3 mL Nebulization TID   multivitamin with minerals  1 tablet Oral Daily   nicotine  21 mg Transdermal Daily   oxybutynin  10 mg Oral QID   tamsulosin  0.4 mg Oral Daily   thiamine  100 mg Oral Daily   Continuous Infusions:  cefTRIAXone (ROCEPHIN)  IV       LOS: 3 days   Time spent: 25 minutes  Alberteen Sam, MD [facility] If 7PM-7AM, please contact night-coverage 10/18/2019, 6:40 PM

## 2019-10-18 NOTE — Progress Notes (Signed)
Took over care of patient at 0300. I agree with the previous nurses  Assessment. Patient currently resting in bed with no complaints.

## 2019-10-18 NOTE — Progress Notes (Addendum)
Patient with increased confusion, reslessness, tachycardia and  hallucinations.  No relief with benzodiazepine use this shift Patient medicated with one time dose haldol for acute delirium likely from lack of sleep (QTc 0.38). Continue to monitor close

## 2019-10-18 NOTE — Progress Notes (Signed)
Pt transferred to RM # [id number] at this time. VSS on 2L . Report called to receiving RN.

## 2019-10-19 DIAGNOSIS — J449 Chronic obstructive pulmonary disease, unspecified: Secondary | ICD-10-CM

## 2019-10-19 DIAGNOSIS — G9341 Metabolic encephalopathy: Secondary | ICD-10-CM

## 2019-10-19 DIAGNOSIS — N10 Acute tubulo-interstitial nephritis: Secondary | ICD-10-CM

## 2019-10-19 DIAGNOSIS — A4151 Sepsis due to Escherichia coli [E. coli]: Principal | ICD-10-CM

## 2019-10-19 DIAGNOSIS — R6520 Severe sepsis without septic shock: Secondary | ICD-10-CM

## 2019-10-19 LAB — COMPREHENSIVE METABOLIC PANEL
ALT: 17 U/L (ref 0–44)
AST: 20 U/L (ref 15–41)
Albumin: 3.1 g/dL — ABNORMAL LOW (ref 3.5–5.0)
Alkaline Phosphatase: 222 U/L — ABNORMAL HIGH (ref 38–126)
Anion gap: 10 (ref 5–15)
BUN: 12 mg/dL (ref 6–20)
CO2: 25 mmol/L (ref 22–32)
Calcium: 8.7 mg/dL — ABNORMAL LOW (ref 8.9–10.3)
Chloride: 100 mmol/L (ref 98–111)
Creatinine, Ser: 0.7 mg/dL (ref 0.44–1.00)
GFR calc Af Amer: >60 mL/min (ref >60)
GFR calc non Af Amer: >60 mL/min (ref >60)
Glucose, Bld: 82 mg/dL (ref 70–99)
Potassium: 3.6 mmol/L (ref 3.5–5.1)
Sodium: 135 mmol/L (ref 135–145)
Total Bilirubin: 0.9 mg/dL (ref 0.3–1.2)
Total Protein: 6.7 g/dL (ref 6.5–8.1)

## 2019-10-19 LAB — CBC
HCT: 35.9 % — ABNORMAL LOW (ref 36.0–46.0)
Hemoglobin: 12 g/dL (ref 12.0–15.0)
MCH: 30.3 pg (ref 26.0–34.0)
MCHC: 33.4 g/dL (ref 30.0–36.0)
MCV: 90.7 fL (ref 80.0–100.0)
Platelets: 212 10*3/uL (ref 150–400)
RBC: 3.96 MIL/uL (ref 3.87–5.11)
RDW: 13.8 % (ref 11.5–15.5)
WBC: 8.6 10*3/uL (ref 4.0–10.5)
nRBC: 0 % (ref 0.0–0.2)

## 2019-10-19 MED ORDER — MAGIC MOUTHWASH W/LIDOCAINE
15.0000 mL | Freq: Four times a day (QID) | ORAL | Status: DC | PRN
  Administered 2019-10-19: 15 mL via ORAL
  Filled 2019-10-19 (×5): qty 15

## 2019-10-19 MED ORDER — DIAZEPAM 5 MG PO TABS
5.0000 mg | ORAL_TABLET | Freq: Every evening | ORAL | Status: DC | PRN
  Administered 2019-10-19: 19:00:00 5 mg via ORAL
  Filled 2019-10-19: qty 1

## 2019-10-19 MED ORDER — MORPHINE SULFATE (PF) 2 MG/ML IV SOLN
2.0000 mg | Freq: Once | INTRAVENOUS | Status: AC
  Administered 2019-10-19: 02:00:00 2 mg via INTRAVENOUS
  Filled 2019-10-19: qty 1

## 2019-10-19 MED ORDER — GLUCOSE 40 % PO GEL
ORAL | Status: AC
  Filled 2019-10-19: qty 1

## 2019-10-19 NOTE — Progress Notes (Signed)
PROGRESS NOTE    Teresa Davenport  RDE:[id number] DOB: [birth year] DOA: 10/13/2019 PCP: Teresa Gang, FNP     Brief summary: Teresa Davenport is a 54 y.o. F with nephrolithiasis, admitted to the urology service on 10/13/2019, with pain from obstructive calculus with hydronephrosis on the LEFT  She underwent LEFT ureteral stent placement with noted pyelonephrosis during the procedure, and kept inpatient for antibiotic treatment, transferred to the hospitalist service on 4/6.       Assessment & Plan:   Sepsis due to acute pyelonephritis Left ureterolithiasis and severe left hydronephrosis and hydroureter S/p stent placement 10/13/2019 18 mm calculus inferior pole LEFT kidney Patient admitted and had left ureteroscopy and stent placement. Post-operatively, patient developed WBC 14.2, fever, tachycardia and encephalopathy.  Subsequently started on IV antibiotics, cultures growing E coli.    Pain unimproved for last 5 days.  CT obtained yesterday showed pyelonephritis, no new abscess, hydronephrosis, dissection.  -Continue ceftriaxone day 7 total of antibitoics, fever curve improving but still temp 100.23F last night. -Continue Flomax and oxybutynin   -Consult urology, appreciate cares, they plan to defer definitive stone treatment while there is active infection -Continue Oxycodone 10 mg q4h PRN, acetaminophen scheduled, as well as tincture of opium suppositories and oxybutynin   COPD No evidence of bronschospasm   Acute metabolic encephalopathy This was thought to be from alcohol withdrawal and the patient was treated with on demand lorazepam.  Patient currently denies daily alcohol use for >1 year, denies withdrawals.  Alternative explanations for her delirium are opiate withdrawal, bipolar disorder, or acute metabolic encephalopathy from infection.  At this point it appears to be resolving with management of her kidney infection.          DVT prophylaxis: Lovenox Code  Status: Full code  Family Communication:  Disposition Plan:  Status is: Inpatient  Remains inpatient appropriate because:Inpatient level of care appropriate due to severity of illness (requires ongoing IV antibiotics)   Dispo: The patient is from: Home              Anticipated d/c is to: Home              Anticipated d/c date is: 2 days              Patient currently is not medically stable to d/c.         Subjective and Interval History:  Patient is less confused today.  She still has left-sided severe flank pain.  No vomiting.  No respiratory distress.  No worsening cough or sputum production.   Objective: Vitals:   10/18/19 2102 10/19/19 0223 10/19/19 0722 10/19/19 0731  BP:  111/68 (!) 100/39   Pulse: 99 (!) 103 96   Resp:  18 (!) 24   Temp:  98.2 F (36.8 C) 100.3 F (37.9 C)   TempSrc:  Oral Oral   SpO2: 97% 96% 95% 90%  Weight:      Height:        Intake/Output Summary (Last 24 hours) at 10/19/2019 1008 Last data filed at 10/19/2019 0300 Gross per 24 hour  Intake 726.02 ml  Output 500 ml  Net 226.02 ml   Filed Weights   10/13/19 0530  Weight: 63 kg    Examination:   General appearance: Well-nourished adult female, lying in bed, appears tired, moderately in pain, no obvious distress. HEENT: Anicteric, conjunctival pink, lids and lashes normal.  No nasal deformity, discharge, or epistaxis, lips moist, teeth normal, oropharynx moist, no  oral lesions, hearing normal Skin: Pale, but warm and dry, without suspicious rashes or lesions.  No rash on abdomen, flank. Cardiac: Tachycardic, regular, no murmurs, no lower extremity edema, JVP normal    Respiratory: Normal respiratory rate and rhythm, lungs clear without rales or wheezes. Abdomen: Abdomen soft, voluntary guarding noted, severe left abdomen and flank pain.  No distention or hepatosplenomegaly. MSK: Normal muscle bulk and tone. Neuro: Extraocular movements intact, moves all 4 extremities with normal  strength and coordination, oriented to person, place, time, and situation. Psych: Attention normal, affect irritated, judgment insight appear normal.       Data Reviewed: I have personally reviewed following labs and imaging studies  CBC: Recent Labs  Lab 10/12/19 1408 10/14/19 1042 10/15/19 0044 10/16/19 0834 10/17/19 0408 10/18/19 0238 10/19/19 0749  WBC 9.6   < > 6.3 6.2 7.7 7.4 8.6  NEUTROABS 8.6*  --  5.6  --   --   --   --   HGB 14.4   < > 12.0 11.2* 11.7* 11.4* 12.0  HCT 43.7   < > 36.2 33.2* 34.6* 34.3* 35.9*  MCV 93.4   < > 93.1 91.2 89.4 91.5 90.7  PLT 254   < > 121* 124* 129* 148* 212   < > = values in this interval not displayed.   Basic Metabolic Panel: Recent Labs  Lab 10/15/19 0044 10/16/19 0834 10/17/19 0408 10/18/19 0238 10/19/19 0749  NA 133* 132* 135 136 135  K 3.4* 3.0* 3.1* 2.9* 3.6  CL 97* 96* 98 96* 100  CO2 23 25 26 28 25   GLUCOSE 135* 105* 90 90 82  BUN 10 7 9 13 12   CREATININE 0.98 0.76 0.81 0.63 0.70  CALCIUM 8.8* 8.3* 8.5* 8.9 8.7*  MG  --  1.7 1.7 1.9  --    GFR: Estimated Creatinine Clearance: 71 mL/min (by C-G formula based on SCr of 0.7 mg/dL). Liver Function Tests: Recent Labs  Lab 10/12/19 1408 10/15/19 0044 10/19/19 0749  AST 40 40 20  ALT 29 39 17  ALKPHOS 83 115 222*  BILITOT 0.8 1.3* 0.9  PROT 7.7 6.9 6.7  ALBUMIN 4.4 3.7 3.1*   Recent Labs  Lab 10/12/19 1408  LIPASE 24   No results for input(s): AMMONIA in the last 168 hours. Coagulation Profile: Recent Labs  Lab 10/14/19 2116  INR 1.0   Cardiac Enzymes: No results for input(s): CKTOTAL, CKMB, CKMBINDEX, TROPONINI in the last 168 hours. BNP (last 3 results) No results for input(s): PROBNP in the last 8760 hours. HbA1C: No results for input(s): HGBA1C in the last 72 hours. CBG: No results for input(s): GLUCAP in the last 168 hours. Lipid Profile: No results for input(s): CHOL, HDL, LDLCALC, TRIG, CHOLHDL, LDLDIRECT in the last 72 hours. Thyroid  Function Tests: No results for input(s): TSH, T4TOTAL, FREET4, T3FREE, THYROIDAB in the last 72 hours. Anemia Panel: No results for input(s): VITAMINB12, FOLATE, FERRITIN, TIBC, IRON, RETICCTPCT in the last 72 hours. Sepsis Labs: Recent Labs  Lab 10/14/19 2116 10/15/19 0044 10/15/19 0842 10/18/19 0944  LATICACIDVEN 1.3 1.2 1.4 0.7    Recent Results (from the past 240 hour(s))  Anaerobic culture     Status: None   Collection Time: 10/13/19  5:25 AM   Specimen: PATH Other; Tissue  Result Value Ref Range Status   Specimen Description   Final    URINE, RANDOM Performed at [facility], [address] [place], [address] [place]    Special Requests  Final    NONE Performed at [facility], [address], [place], [place] [address]    Culture   Final    NO ANAEROBES ISOLATED Performed at [facility], [place] [address], [place], [place] [address]    Report Status 10/18/2019 FINAL  Final  Urine Culture     Status: Abnormal   Collection Time: 10/13/19  5:25 AM   Specimen: Urine, Random  Result Value Ref Range Status   Specimen Description URINE, RANDOM  Final   Special Requests NONE  Final   Culture >=100,000 COLONIES/mL ESCHERICHIA COLI (A)  Final   Report Status 10/15/2019 FINAL  Final   Organism ID, Bacteria ESCHERICHIA COLI (A)  Final      Susceptibility   Escherichia coli - MIC*    AMPICILLIN >=32 RESISTANT Resistant     CEFAZOLIN <=4 SENSITIVE Sensitive     CEFTRIAXONE <=0.25 SENSITIVE Sensitive     CIPROFLOXACIN >=4 RESISTANT Resistant     GENTAMICIN <=1 SENSITIVE Sensitive     IMIPENEM <=0.25 SENSITIVE Sensitive     NITROFURANTOIN 64 INTERMEDIATE Intermediate     TRIMETH/SULFA >=320 RESISTANT Resistant     AMPICILLIN/SULBACTAM >=32 RESISTANT Resistant     PIP/TAZO <=4 SENSITIVE Sensitive     * >=100,000 COLONIES/mL ESCHERICHIA COLI  Respiratory Panel by RT PCR (Flu A&B, Covid) - Nasopharyngeal Swab     Status: None   Collection Time:  10/13/19  6:33 AM   Specimen: Nasopharyngeal Swab  Result Value Ref Range Status   SARS Coronavirus 2 by RT PCR NEGATIVE NEGATIVE Final    Comment: (NOTE) SARS-CoV-2 target nucleic acids are NOT DETECTED. The SARS-CoV-2 RNA is generally detectable in upper respiratoy specimens during the acute phase of infection. The lowest concentration of SARS-CoV-2 viral copies this assay can detect is 131 copies/mL. A negative result does not preclude SARS-Cov-2 infection and should not be used as the sole basis for treatment or other patient management decisions. A negative result may occur with  improper specimen collection/handling, submission of specimen other than nasopharyngeal swab, presence of viral mutation(s) within the areas targeted by this assay, and inadequate number of viral copies (<131 copies/mL). A negative result must be combined with clinical observations, patient history, and epidemiological information. The expected result is Negative. Fact Sheet for Patients:  PinkCheek.be Fact Sheet for Healthcare Providers:  GravelBags.it This test is not yet ap proved or cleared by the [place] FDA and  has been authorized for detection and/or diagnosis of SARS-CoV-2 by FDA under an Emergency Use Authorization (EUA). This EUA will remain  in effect (meaning this test can be used) for the duration of the COVID-19 declaration under Section 564(b)(1) of the Act, 21 U.S.C. section 360bbb-3(b)(1), unless the authorization is terminated or revoked sooner.    Influenza A by PCR NEGATIVE NEGATIVE Final   Influenza B by PCR NEGATIVE NEGATIVE Final    Comment: (NOTE) The Xpert Xpress SARS-CoV-2/FLU/RSV assay is intended as an aid in  the diagnosis of influenza from Nasopharyngeal swab specimens and  should not be used as a sole basis for treatment. Nasal washings and  aspirates are unacceptable for Xpert Xpress SARS-CoV-2/FLU/RSV    testing. Fact Sheet for Patients: PinkCheek.be Fact Sheet for Healthcare Providers: GravelBags.it This test is not yet approved or cleared by the [place] FDA and  has been authorized for detection and/or diagnosis of SARS-CoV-2 by  FDA under an Emergency Use Authorization (EUA). This EUA will remain  in effect (meaning  this test can be used) for the duration of the  Covid-19 declaration under Section 564(b)(1) of the Act, 21  U.S.C. section 360bbb-3(b)(1), unless the authorization is  terminated or revoked. Performed at [facility], [address] [place], [place] [address]   CULTURE, BLOOD (ROUTINE X 2) w Reflex to ID Panel     Status: None (Preliminary result)   Collection Time: 10/15/19 12:44 AM   Specimen: BLOOD  Result Value Ref Range Status   Specimen Description BLOOD BLOOD RIGHT HAND  Final   Special Requests   Final    BOTTLES DRAWN AEROBIC AND ANAEROBIC Blood Culture adequate volume   Culture   Final    NO GROWTH 4 DAYS Performed at [facility], [address] [place], [place] [address]    Report Status PENDING  Incomplete  CULTURE, BLOOD (ROUTINE X 2) w Reflex to ID Panel     Status: None (Preliminary result)   Collection Time: 10/15/19 12:56 AM   Specimen: BLOOD  Result Value Ref Range Status   Specimen Description BLOOD LEFT ANTECUBITAL  Final   Special Requests   Final    BOTTLES DRAWN AEROBIC AND ANAEROBIC Blood Culture adequate volume   Culture   Final    NO GROWTH 4 DAYS Performed at [facility], [address] [place], [place] [address]    Report Status PENDING  Incomplete  MRSA PCR Screening     Status: None   Collection Time: 10/16/19  8:15 AM   Specimen: Nasopharyngeal  Result Value Ref Range Status   MRSA by PCR NEGATIVE NEGATIVE Final    Comment:        The GeneXpert MRSA Assay (FDA approved for NASAL specimens only), is one component of  a comprehensive MRSA colonization surveillance program. It is not intended to diagnose MRSA infection nor to guide or monitor treatment for MRSA infections. Performed at [facility], [address] [place], [place] [address]       Radiology Studies: CT ABDOMEN PELVIS W CONTRAST  Result Date: 10/18/2019 CLINICAL DATA:  History of nephrolithiasis EXAM: CT ABDOMEN AND PELVIS WITH CONTRAST TECHNIQUE: Multidetector CT imaging of the abdomen and pelvis was performed using the standard protocol following bolus administration of intravenous contrast. CONTRAST:  100mL OMNIPAQUE IOHEXOL 300 MG/ML  SOLN COMPARISON:  10/12/2019 FINDINGS: Lower chest: Lung bases demonstrate new bibasilar consolidation. Calcified granuloma is noted on the left. Hepatobiliary: No focal liver abnormality is seen. No gallstones, gallbladder wall thickening, or biliary dilatation. Pancreas: Unremarkable. No pancreatic ductal dilatation or surrounding inflammatory changes. Spleen: Normal in size without focal abnormality. Adrenals/Urinary Tract: Adrenal glands are within normal limits bilaterally. Right kidney demonstrates shows a small 2 mm lower pole renal stone. Left ureteral stent is noted in place. The previously seen large lower pole renal stone is again noted and stable. The previously seen left ureteral stone is again identified in the distal ureter stable in appearance from the prior exam. Persistent perinephric stranding is noted on the left. Some patchy areas of decreased enhancement are noted consistent with pyelonephritis. These persist on delayed images as well. The bladder is well distended. Stomach/Bowel: Colon is well visualized without obstructive or inflammatory change. The appendix is not well visualized although no inflammatory changes to suggest appendicitis are noted. No small bowel or gastric abnormality is seen. Vascular/Lymphatic: Aortic atherosclerotic changes are noted without aneurysmal  dilatation. There are periaortic lymph nodes identified best seen on image number 34 of series 2 near the  left renal hilum. These are felt to be reactive and have increased in size somewhat in the interval from the prior exam. The largest of these measures 11 mm in short axis. Reproductive: Status post hysterectomy. No adnexal masses. Other: No abdominal wall hernia or abnormality. No abdominopelvic ascites. Musculoskeletal: No acute or significant osseous findings. IMPRESSION: Left ureteral stent is now seen in satisfactory position. Left ureteral stone is again identified adjacent to the stent stable from the prior study. Large left renal stone is again seen. Changes in the left kidney consistent with pyelonephritis. Tiny nonobstructing right renal stone. Peri aortic lymphadenopathy likely related to the underlying renal infection. New bibasilar consolidation. Electronically Signed   By: Alcide Clever M.D.   On: 10/18/2019 11:46     Scheduled Meds: . acetaminophen  1,000 mg Oral TID  . enoxaparin (LOVENOX) injection  40 mg Subcutaneous Q24H  . ipratropium-albuterol  3 mL Nebulization TID  . nicotine  21 mg Transdermal Daily  . oxybutynin  10 mg Oral QID  . tamsulosin  0.4 mg Oral Daily   Continuous Infusions: . cefTRIAXone (ROCEPHIN)  IV Stopped (10/18/19 2231)     LOS: 4 days   Time spent: 25 minutes   Alberteen Sam, MD [facility] If 7PM-7AM, please contact night-coverage 10/19/2019, 10:08 AM

## 2019-10-19 NOTE — Progress Notes (Signed)
   10/19/19 0722  Assess: MEWS Score  Temp 100.3 F (37.9 C)  BP (!) 100/39  Pulse Rate 96  Resp (!) 24  SpO2 95 %  O2 Device Room Air  Assess: MEWS Score  MEWS Temp 0  MEWS Systolic 1  MEWS Pulse 0  MEWS RR 1  MEWS LOC 0  MEWS Score 2  MEWS Score Color Yellow  Assess: if the MEWS score is Yellow or Red  Were vital signs taken at a resting state? Yes  Focused Assessment Documented focused assessment  MEWS guidelines implemented *See Row Information* Yes  Treat  MEWS Interventions Administered prn meds/treatments  Take Vital Signs  Increase Vital Sign Frequency  Yellow: Q 2hr X 2 then Q 4hr X 2, if remains yellow, continue Q 4hrs  Escalate  MEWS: Escalate Yellow: discuss with charge nurse/RN and consider discussing with provider and RRT  Notify: Charge Nurse/RN  Name of Charge Nurse/RN Notified Molli Hazard   Date Charge Nurse/RN Notified 10/19/19  Time Charge Nurse/RN Notified 0725  Document  Patient Outcome Stabilized after interventions  Progress note created (see row info) Yes

## 2019-10-20 LAB — CULTURE, BLOOD (ROUTINE X 2)
Culture: NO GROWTH
Culture: NO GROWTH
Special Requests: ADEQUATE
Special Requests: ADEQUATE

## 2019-10-20 MED ORDER — OXYBUTYNIN CHLORIDE 5 MG PO TABS: 10.0000 mg | ORAL_TABLET | Freq: Four times a day (QID) | ORAL | 0 refills | Status: DC

## 2019-10-20 MED ORDER — CEPHALEXIN 500 MG PO CAPS: 500.0000 mg | ORAL_CAPSULE | Freq: Three times a day (TID) | ORAL | 0 refills | Status: AC

## 2019-10-20 MED ORDER — OXYCODONE HCL 10 MG PO TABS: 10.0000 mg | ORAL_TABLET | ORAL | 0 refills | Status: DC | PRN

## 2019-10-20 MED ORDER — MAGIC MOUTHWASH: 5.0000 mL | Freq: Three times a day (TID) | ORAL | 0 refills | Status: DC | PRN

## 2019-10-20 MED ORDER — TAMSULOSIN HCL 0.4 MG PO CAPS: 0.4000 mg | ORAL_CAPSULE | Freq: Every day | ORAL | 0 refills | Status: DC

## 2019-10-20 MED ORDER — MAGIC MOUTHWASH
15.0000 mL | Freq: Four times a day (QID) | ORAL | Status: DC
  Administered 2019-10-20: 15 mL via ORAL
  Filled 2019-10-20: qty 15

## 2019-10-20 NOTE — Progress Notes (Signed)
Upon learning of d/c @0845  pt removed IV herself and walked out without paperwork, prescription, or education. Mother called back up here @0915  asking if she can come pick paperwork and prescription up for pt. Paperwork left at secretary's desk at this time with prescription in envelope. IV was intact when checked by this nurse.

## 2019-10-20 NOTE — Discharge Summary (Signed)
Physician Discharge Summary  Teresa Davenport:[id number] DOB: 01-29-1966 DOA: 10/13/2019  PCP: Teresa Gang, FNP  Admit date: 10/13/2019 Discharge date: 10/20/2019  Admitted From: Home  Disposition:  Home   Recommendations for Outpatient Follow-up:  1. Follow up with Urology in 5 days   Home Health: None  Equipment/Devices: none  Discharge Condition: Good  CODE STATUS: FULL Diet recommendation: Regular  Brief/Interim Summary: Ms [name] a 54 y.o.F with nephrolithiasis, admitted to the urology service on 10/13/2019, with pain from obstructive calculus with hydronephrosis on the LEFT  She underwent LEFT ureteral stent placement with noted pyelonephrosis during the procedure, and kept inpatient for antibiotic treatment, transferred to the hospitalist service on 4/6.     PRINCIPAL HOSPITAL DIAGNOSIS: Sepsis and pyelonephritis due to impacted renal stone    Discharge Diagnoses:   Sepsis due to acute pyelonephritis Left ureterolithiasis and severe left hydronephrosis and hydroureter S/p stent placement 10/13/2019 18 mm calculus inferior pole LEFT kidney Patient admitted and had left ureteroscopy and stent placement. Post-operatively, patient developed WBC 14.2, fever, tachycardia and encephalopathy.  Subsequently started on IV antibiotics, cultures growing E coli.    Patient had severe pain for >5 days.  CT obtained and showed pyelonephritis, no new abscess, hydronephrosis, dissection.  Started on oxybutynin and flomax and discharged on cephalexin for 7 more days.    To follow up with Urology this Thursday for lithotripsy.     COPD No evidence of bronschospasm   Acute metabolic encephalopathy This was thought to be from alcohol withdrawal and the patient was treated with on demand lorazepam.  Patient currently denies daily alcohol use for >1 year, denies withdrawals.  Alternative explanations for her delirium are opiate withdrawal, bipolar disorder, or  acute metabolic encephalopathy from infection.  It appears to have resolved with management of her kidney infection.            Discharge Instructions  Discharge Instructions    Discharge instructions   Complete by: As directed    From Dr. Maryfrances Davenport: You were admitted for a kidney stone and kidney infection (pyelonephritis). The stone was stented.  The Urologists plan to do the lithotripsy on Apr 16th, but the time hasn't been scheduled.  Call Dr. Keane Davenport office (number listed below in To Do section) tomorrow to arrange the time.  For pain: Take cephalexin/Keflex 500 mg three times daily  Take oxybutynin up to four times daily as needed for spasms (this relaxes smooth muscle) Take Flomax/tamsulosin daily  Take acetaminophen 1000 mg three times daily Take oxycodone 10 mg up to four times daily as needed, take with acetaminophen  Use the magic mouthwash and obtain a probiotic pill   Increase activity slowly   Complete by: As directed      Allergies as of 10/20/2019      Reactions   Meropenem Shortness Of Breath   Penicillins Anaphylaxis, Swelling   Has patient had a PCN reaction causing immediate rash, facial/tongue/throat swelling, SOB or lightheadedness with hypotension: Yes Has patient had a PCN reaction causing severe rash involving mucus membranes or skin necrosis: No Has patient had a PCN reaction that required hospitalization No Has patient had a PCN reaction occurring within the last 10 years: No If all of the above answers are "NO", then may proceed with Cephalosporin use.   Doxycycline Swelling   Motrin [ibuprofen] Swelling   Tape    Patient says she cannot tolerate any tape but tegaderm   Toradol [ketorolac Tromethamine] Swelling   Tramadol Swelling  Amoxicillin Rash   Ketorolac Rash   Methadone Rash   Naproxen Sodium Rash      Medication List    TAKE these medications   acetaminophen 500 MG tablet Commonly known as: TYLENOL Take 2,000 mg by  mouth every 6 (six) hours as needed for moderate pain (patient takes 4 tablets at a time).   cephALEXin 500 MG capsule Commonly known as: KEFLEX Take 1 capsule (500 mg total) by mouth 3 (three) times daily for 7 days.   estrogens (conjugated) 0.45 MG tablet Commonly known as: PREMARIN Take 0.45 mg by mouth daily.   gabapentin 300 MG capsule Commonly known as: Neurontin Take 1 capsule (300 mg total) by mouth 3 (three) times daily for 10 days.   magic mouthwash Soln Take 5 mLs by mouth 3 (three) times daily as needed for mouth pain.   oxybutynin 5 MG tablet Commonly known as: DITROPAN Take 2 tablets (10 mg total) by mouth 4 (four) times daily.   Oxycodone HCl 10 MG Tabs Take 1 tablet (10 mg total) by mouth every 4 (four) hours as needed for moderate pain or severe pain.   phenazopyridine 200 MG tablet Commonly known as: Pyridium Take 1 tablet (200 mg total) by mouth 3 (three) times daily as needed for pain.   potassium chloride 10 MEQ tablet Commonly known as: KLOR-CON Take 1 tablet (10 mEq total) daily by mouth.   promethazine 25 MG tablet Commonly known as: PHENERGAN Take 1 tablet (25 mg total) by mouth every 6 (six) hours as needed for nausea or vomiting.   tamsulosin 0.4 MG Caps capsule Commonly known as: FLOMAX Take 1 capsule (0.4 mg total) by mouth daily.      Follow-up Information    [facility].   Specialty: Urology Why: Office will call to arrange f/u.  Contact information: [address], [address] [place] [address] [phone]       Teresa Gang, FNP. Schedule an appointment as soon as possible for a visit in 1 week(s).   Specialty: Family Medicine Contact information: [address] [place] [address] [phone]          Allergies  Allergen Reactions  . Meropenem Shortness Of Breath  . Penicillins Anaphylaxis and Swelling    Has patient had a PCN reaction causing immediate rash,  facial/tongue/throat swelling, SOB or lightheadedness with hypotension: Yes Has patient had a PCN reaction causing severe rash involving mucus membranes or skin necrosis: No Has patient had a PCN reaction that required hospitalization No Has patient had a PCN reaction occurring within the last 10 years: No If all of the above answers are "NO", then may proceed with Cephalosporin use.  Marland Kitchen Doxycycline Swelling  . Motrin [Ibuprofen] Swelling  . Tape     Patient says she cannot tolerate any tape but tegaderm  . Toradol [Ketorolac Tromethamine] Swelling  . Tramadol Swelling  . Amoxicillin Rash  . Ketorolac Rash  . Methadone Rash  . Naproxen Sodium Rash    Consultations:  Urology   Procedures/Studies: DG Abd 1 View  Result Date: 10/14/2019 CLINICAL DATA:  Flank pain EXAM: ABDOMEN - 1 VIEW COMPARISON:  CT abdomen 10/12/2019 FINDINGS: There is no bowel dilatation to suggest obstruction. There is no evidence of pneumoperitoneum, portal venous gas or pneumatosis. Left nephroureteral stent. 17 mm left lower pole renal calculus. 6 mm distal left ureteral calculus. The osseous structures are unremarkable. IMPRESSION: Left nephroureteral stent in satisfactory position. 17 mm left lower pole  renal calculus. 6 mm distal left ureteral calculus. Electronically Signed   By: Kathreen Devoid   On: 10/14/2019 14:05   CT HEAD WO CONTRAST  Result Date: 10/16/2019 CLINICAL DATA:  Initial evaluation for acute encephalopathy. EXAM: CT HEAD WITHOUT CONTRAST TECHNIQUE: Contiguous axial images were obtained from the base of the skull through the vertex without intravenous contrast. COMPARISON:  None available. FINDINGS: Brain: Cerebral volume within normal limits for patient age. No evidence for acute intracranial hemorrhage. No findings to suggest acute large vessel territory infarct. No mass lesion, midline shift, or mass effect. Ventricles are normal in size without evidence for hydrocephalus. No extra-axial fluid  collection identified. Vascular: No hyperdense vessel identified. Skull: Scalp soft tissues demonstrate no acute abnormality. Calvarium intact. Sinuses/Orbits: Globes and orbital soft tissues within normal limits. Visualized paranasal sinuses are clear. No mastoid effusion. IMPRESSION: Negative head CT.  No acute intracranial abnormality identified. Electronically Signed   By: Jeannine Boga M.D.   On: 10/16/2019 03:35   CT ANGIO CHEST PE W OR WO CONTRAST  Result Date: 10/14/2019 CLINICAL DATA:  54 year old female with shortness of breath. EXAM: CT ANGIOGRAPHY CHEST WITH CONTRAST TECHNIQUE: Multidetector CT imaging of the chest was performed using the standard protocol during bolus administration of intravenous contrast. Multiplanar CT image reconstructions and MIPs were obtained to evaluate the vascular anatomy. CONTRAST:  56mL OMNIPAQUE IOHEXOL 350 MG/ML SOLN COMPARISON:  Portable chest earlier today. CTA chest 08/25/2018. FINDINGS: Cardiovascular: Adequate contrast bolus timing in the pulmonary arterial tree. Mild respiratory motion. No focal filling defect identified in the pulmonary arteries to suggest acute pulmonary embolism. No cardiomegaly or pericardial effusion. Negative thoracic aorta. Mild calcified plaque in the visible proximal abdominal aorta. Mildly tortuous proximal great vessels. Mediastinum/Nodes: No lymphadenopathy. Lungs/Pleura: Major airways are patent. Lower lung volumes with chronic centrilobular and paraseptal emphysema. Confluent but enhancing left lung base atelectasis with superimposed trace new layering left pleural effusion. Right costophrenic angle opacity also most resembles atelectasis. No right pleural effusion. Upper Abdomen: Grossly normal visible upper abdominal viscera, early contrast timing suspected in the spleen. No upper abdominal free fluid. Musculoskeletal: No acute osseous abnormality identified. Chronic postoperative changes to left lateral ribs with ORIF  hardware. Midthoracic calcified ligament flavum hypertrophy. Review of the MIP images confirms the above findings. IMPRESSION: 1. Negative for acute pulmonary embolus. 2. Emphysema (ICD10-J43.9) with lower lung volumes. Left lung base pneumonia is difficult to exclude, although lung enhancement there favors atelectasis. However, there is also a new trace left pleural effusion. Electronically Signed   By: Genevie Ann M.D.   On: 10/14/2019 23:03   CT ABDOMEN PELVIS W CONTRAST  Result Date: 10/18/2019 CLINICAL DATA:  History of nephrolithiasis EXAM: CT ABDOMEN AND PELVIS WITH CONTRAST TECHNIQUE: Multidetector CT imaging of the abdomen and pelvis was performed using the standard protocol following bolus administration of intravenous contrast. CONTRAST:  183mL OMNIPAQUE IOHEXOL 300 MG/ML  SOLN COMPARISON:  10/12/2019 FINDINGS: Lower chest: Lung bases demonstrate new bibasilar consolidation. Calcified granuloma is noted on the left. Hepatobiliary: No focal liver abnormality is seen. No gallstones, gallbladder wall thickening, or biliary dilatation. Pancreas: Unremarkable. No pancreatic ductal dilatation or surrounding inflammatory changes. Spleen: Normal in size without focal abnormality. Adrenals/Urinary Tract: Adrenal glands are within normal limits bilaterally. Right kidney demonstrates shows a small 2 mm lower pole renal stone. Left ureteral stent is noted in place. The previously seen large lower pole renal stone is again noted and stable. The previously seen left ureteral stone is again identified  in the distal ureter stable in appearance from the prior exam. Persistent perinephric stranding is noted on the left. Some patchy areas of decreased enhancement are noted consistent with pyelonephritis. These persist on delayed images as well. The bladder is well distended. Stomach/Bowel: Colon is well visualized without obstructive or inflammatory change. The appendix is not well visualized although no inflammatory changes  to suggest appendicitis are noted. No small bowel or gastric abnormality is seen. Vascular/Lymphatic: Aortic atherosclerotic changes are noted without aneurysmal dilatation. There are periaortic lymph nodes identified best seen on image number 34 of series 2 near the left renal hilum. These are felt to be reactive and have increased in size somewhat in the interval from the prior exam. The largest of these measures 11 mm in short axis. Reproductive: Status post hysterectomy. No adnexal masses. Other: No abdominal wall hernia or abnormality. No abdominopelvic ascites. Musculoskeletal: No acute or significant osseous findings. IMPRESSION: Left ureteral stent is now seen in satisfactory position. Left ureteral stone is again identified adjacent to the stent stable from the prior study. Large left renal stone is again seen. Changes in the left kidney consistent with pyelonephritis. Tiny nonobstructing right renal stone. Peri aortic lymphadenopathy likely related to the underlying renal infection. New bibasilar consolidation. Electronically Signed   By: Alcide Clever M.D.   On: 10/18/2019 11:46   US RENAL  Result Date: 10/16/2019 CLINICAL DATA:  Hydronephrosis EXAM: RENAL / URINARY TRACT ULTRASOUND COMPLETE COMPARISON:  Renal ultrasound 09/02/2016 FINDINGS: Right Kidney: Renal measurements: 12.9 x 4.8 x 6.0 cm = volume: 196 mL. Cortical thinning. Normal cortical echogenicity. No mass, hydronephrosis or shadowing calcification. Left Kidney: Renal measurements: 11.7 x 4.3 x 5.5 cm = volume: 144 mL. Cortical thinning. Upper normal cortical echogenicity. Mild LEFT hydronephrosis. No renal mass lesion. Shadowing calcification identified at inferior pole 18 mm diameter. No perinephric fluid. Bladder: Well distended, normal appearance. Other: N/A IMPRESSION: Mild LEFT hydronephrosis. 18 mm shadowing calculus inferior pole LEFT kidney. Electronically Signed   By: Ulyses Southward M.D.   On: 10/16/2019 09:27   DG Chest Port 1  View  Result Date: 10/14/2019 CLINICAL DATA:  Tachycardia, fever, hard time urinating EXAM: PORTABLE CHEST 1 VIEW COMPARISON:  Portable exam 2052 hours compared to 08/25/2018 FINDINGS: Upper normal heart size. Mediastinal contours normal. Scattered interstitial infiltrates appear increased since previous exam suspected combination of underlying chronic interstitial disease with superimposed infection or edema. Underlying emphysematous changes and questionable tiny LEFT pleural effusion. No pneumothorax. Bones demineralized with prior plating of lateral LEFT fourth and fifth ribs. BILATERAL breast prostheses. LEFT ureteral stent identified. IMPRESSION: Changes of COPD and chronic interstitial lung disease with superimposed interstitial infiltrates which could represent edema or infection. Electronically Signed   By: Ulyses Southward M.D.   On: 10/14/2019 21:01   DG OR UROLOGY CYSTO IMAGE ([facility] ONLY)  Result Date: 10/14/2019 There is no interpretation for this exam.  This order is for images obtained during a surgical procedure.  Please See "Surgeries" Tab for more information regarding the procedure.   DG OR UROLOGY CYSTO IMAGE ([facility] ONLY)  Result Date: 10/13/2019 There is no interpretation for this exam.  This order is for images obtained during a surgical procedure.  Please See "Surgeries" Tab for more information regarding the procedure.   ECHOCARDIOGRAM COMPLETE  Result Date: 10/15/2019    ECHOCARDIOGRAM REPORT   Patient Name:   NANSI BIRMINGHAM Date of Exam: 10/15/2019 Medical Rec #:  [id number]  Height:       62.0 in Accession #:    [id number]           Weight:       138.9 lb Date of Birth:  1965-09-08            BSA:          1.637 m Patient Age:    53 years             BP:           84/60 mmHg Patient Gender: F                    HR:           120 bpm. Exam Location:  [facility] Procedure: 2D Echo Indications:     Abnormal ECG  History:         Patient has no prior history of Echocardiogram  examinations.                  Risk Factors:Current Smoker.  Sonographer:     Enrique Sack Thornton-Maynard Referring Phys:  [id number] Hubbard Hartshorn OUMA Diagnosing Phys: Yvonne Kendall MD  Sonographer Comments: Image acquisition challenging due to breast implants. IMPRESSIONS  1. Left ventricular ejection fraction, by estimation, is 65 to 70%. The left ventricle has normal function. The left ventricle has no regional wall motion abnormalities. Left ventricular diastolic parameters were normal.  2. Right ventricular systolic function is normal. The right ventricular size is normal. There is normal pulmonary artery systolic pressure.  3. The mitral valve is grossly normal. Trivial mitral valve regurgitation. No evidence of mitral stenosis.  4. The aortic valve was not well visualized. Aortic valve regurgitation is not visualized. No aortic stenosis is present.  5. The inferior vena cava is normal in size with greater than 50% respiratory variability, suggesting right atrial pressure of 3 mmHg. FINDINGS  Left Ventricle: Left ventricular ejection fraction, by estimation, is 65 to 70%. The left ventricle has normal function. The left ventricle has no regional wall motion abnormalities. The left ventricular internal cavity size was normal in size. There is  no left ventricular hypertrophy. Left ventricular diastolic parameters were normal. Right Ventricle: The right ventricular size is normal. No increase in right ventricular wall thickness. Right ventricular systolic function is normal. There is normal pulmonary artery systolic pressure. The tricuspid regurgitant velocity is 2.82 m/s, and  with an assumed right atrial pressure of 3 mmHg, the estimated right ventricular systolic pressure is 34.8 mmHg. Left Atrium: Left atrial size was normal in size. Right Atrium: Right atrial size was normal in size. Pericardium: There is no evidence of pericardial effusion. Mitral Valve: The mitral valve is grossly normal. Trivial mitral  valve regurgitation. No evidence of mitral valve stenosis. MV peak gradient, 6.1 mmHg. The mean mitral valve gradient is 4.0 mmHg. Tricuspid Valve: The tricuspid valve is not well visualized. Tricuspid valve regurgitation is trivial. Aortic Valve: The aortic valve was not well visualized. Aortic valve regurgitation is not visualized. No aortic stenosis is present. Aortic valve mean gradient measures 5.0 mmHg. Aortic valve peak gradient measures 8.5 mmHg. Aortic valve area, by VTI measures 1.66 cm. Pulmonic Valve: The pulmonic valve was not well visualized. Pulmonic valve regurgitation is not visualized. No evidence of pulmonic stenosis. Aorta: The aortic root is normal in size and structure. Pulmonary Artery: The pulmonary artery is not well seen. Venous: The inferior vena cava is normal in size with greater than 50%  respiratory variability, suggesting right atrial pressure of 3 mmHg. IAS/Shunts: The interatrial septum was not well visualized.  LEFT VENTRICLE PLAX 2D LVIDd:         3.74 cm  Diastology LVIDs:         2.61 cm  LV e' lateral:   16.80 cm/s LV PW:         0.86 cm  LV E/e' lateral: 6.5 LV IVS:        0.81 cm  LV e' medial:    13.60 cm/s LVOT diam:     1.70 cm  LV E/e' medial:  8.1 LV SV:         42 LV SV Index:   26 LVOT Area:     2.27 cm  RIGHT VENTRICLE RV S prime:     10.60 cm/s TAPSE (M-mode): 2.4 cm LEFT ATRIUM             Index       RIGHT ATRIUM           Index LA diam:        2.60 cm 1.59 cm/m  RA Area:     15.40 cm LA Vol (A2C):   38.4 ml 23.42 ml/m RA Volume:   38.30 ml  23.39 ml/m LA Vol (A4C):   34.3 ml 20.95 ml/m LA Biplane Vol: 40.3 ml 24.61 ml/m  AORTIC VALVE AV Area (Vmax):    1.74 cm AV Area (Vmean):   1.58 cm AV Area (VTI):     1.66 cm AV Vmax:           146.00 cm/s AV Vmean:          106.000 cm/s AV VTI:            0.252 m AV Peak Grad:      8.5 mmHg AV Mean Grad:      5.0 mmHg LVOT Vmax:         112.00 cm/s LVOT Vmean:        73.800 cm/s LVOT VTI:          0.184 m LVOT/AV  VTI ratio: 0.73  AORTA Ao Root diam: 3.10 cm MITRAL VALVE                TRICUSPID VALVE MV Area (PHT): 4.78 cm     TR Peak grad:   31.8 mmHg MV Peak grad:  6.1 mmHg     TR Vmax:        282.00 cm/s MV Mean grad:  4.0 mmHg MV Vmax:       1.23 m/s     SHUNTS MV Vmean:      95.2 cm/s    Systemic VTI:  0.18 m MV E velocity: 109.50 cm/s  Systemic Diam: 1.70 cm MV A velocity: 88.05 cm/s MV E/A ratio:  1.24 Cristal [place] MD Electronically signed by Yvonne Kendall MD Signature Date/Time: 10/15/2019/2:43:03 PM    Final    CT Renal Stone Study  Result Date: 10/12/2019 CLINICAL DATA:  Left flank pain, kidney stones suspected, history of kidney stones EXAM: CT ABDOMEN AND PELVIS WITHOUT CONTRAST TECHNIQUE: Multidetector CT imaging of the abdomen and pelvis was performed following the standard protocol without IV contrast. COMPARISON:  07/31/2016 FINDINGS: Lower chest: No acute abnormality. Hepatobiliary: No solid liver abnormality is seen. No gallstones, gallbladder wall thickening, or biliary dilatation. Pancreas: Unremarkable. No pancreatic ductal dilatation or surrounding inflammatory changes. Spleen: Normal in size without significant abnormality. Adrenals/Urinary Tract: Adrenal glands are unremarkable.  There is a 5 mm calculus in the distal third of the left ureter with severe left hydronephrosis and hydroureter. There are additional nonobstructive calculi in the kidneys bilaterally, with a large calculus in the inferior pole of the left kidney measuring 1.6 cm. Air in the urinary bladder. Stomach/Bowel: Stomach is within normal limits. Appendix appears normal. No evidence of bowel wall thickening, distention, or inflammatory changes. Vascular/Lymphatic: Aortic atherosclerosis. No enlarged abdominal or pelvic lymph nodes. Reproductive: No mass or other significant abnormality. Other: No abdominal wall hernia or abnormality. No abdominopelvic ascites. Musculoskeletal: No acute or significant osseous findings.  IMPRESSION: 1. There is a 5 mm calculus in the distal third of the left ureter with severe left hydronephrosis and hydroureter. 2. There are additional nonobstructive calculi in the kidneys bilaterally, with a large calculus in the inferior pole of the left kidney measuring 1.6 cm. 3.  Air in the urinary bladder.  Correlate with catheterization. 4.  Aortic Atherosclerosis (ICD10-I70.0). Electronically Signed   By: Lauralyn PrimesAlex  Bibbey M.D.   On: 10/12/2019 14:57       Subjective: Patient pain stable, no improvement, but appetite good, mentation at baseline, feeling better, desiring to go home.  Discharge Exam: Vitals:   10/19/19 2341 10/20/19 0722  BP: 116/67 107/65  Pulse: (!) 102 93  Resp: 20 16  Temp: (!) 97.5 F (36.4 C) 98 F (36.7 C)  SpO2: 92% 92%   Vitals:   10/19/19 1436 10/19/19 1945 10/19/19 2341 10/20/19 0722  BP:   116/67 107/65  Pulse:   (!) 102 93  Resp:   20 16  Temp:   (!) 97.5 F (36.4 C) 98 F (36.7 C)  TempSrc:   Oral Oral  SpO2: 92% 93% 92% 92%  Weight:      Height:        General: Pt is alert, awake, not in acute distress Cardiovascular: RRR, nl S1-S2, no murmurs appreciated.   No LE edema.   Respiratory: Normal respiratory rate and rhythm.  CTAB without rales or wheezes. Abdominal: Abdomen soft and left sided tenderness/guarding is unchanged.  No distension or HSM.   Neuro/Psych: Strength symmetric in upper and lower extremities.  Judgment and insight appear normal.   The results of significant diagnostics from this hospitalization (including imaging, microbiology, ancillary and laboratory) are listed below for reference.     Microbiology: Recent Results (from the past 240 hour(s))  Anaerobic culture     Status: None   Collection Time: 10/13/19  5:25 AM   Specimen: PATH Other; Tissue  Result Value Ref Range Status   Specimen Description   Final    URINE, RANDOM Performed at [facility], [address] [place], [place] [address]     Special Requests   Final    NONE Performed at [facility], [address] [place], [place] [address]    Culture   Final    NO ANAEROBES ISOLATED Performed at [facility], [address] [place], [place] [address]    Report Status 10/18/2019 FINAL  Final  Urine Culture     Status: Abnormal   Collection Time: 10/13/19  5:25 AM   Specimen: Urine, Random  Result Value Ref Range Status   Specimen Description URINE, RANDOM  Final   Special Requests NONE  Final   Culture >=100,000 COLONIES/mL ESCHERICHIA COLI (A)  Final   Report Status 10/15/2019 FINAL  Final   Organism ID, Bacteria ESCHERICHIA COLI (A)  Final  Susceptibility   Escherichia coli - MIC*    AMPICILLIN >=32 RESISTANT Resistant     CEFAZOLIN <=4 SENSITIVE Sensitive     CEFTRIAXONE <=0.25 SENSITIVE Sensitive     CIPROFLOXACIN >=4 RESISTANT Resistant     GENTAMICIN <=1 SENSITIVE Sensitive     IMIPENEM <=0.25 SENSITIVE Sensitive     NITROFURANTOIN 64 INTERMEDIATE Intermediate     TRIMETH/SULFA >=320 RESISTANT Resistant     AMPICILLIN/SULBACTAM >=32 RESISTANT Resistant     PIP/TAZO <=4 SENSITIVE Sensitive     * >=100,000 COLONIES/mL ESCHERICHIA COLI  Respiratory Panel by RT PCR (Flu A&B, Covid) - Nasopharyngeal Swab     Status: None   Collection Time: 10/13/19  6:33 AM   Specimen: Nasopharyngeal Swab  Result Value Ref Range Status   SARS Coronavirus 2 by RT PCR NEGATIVE NEGATIVE Final    Comment: (NOTE) SARS-CoV-2 target nucleic acids are NOT DETECTED. The SARS-CoV-2 RNA is generally detectable in upper respiratoy specimens during the acute phase of infection. The lowest concentration of SARS-CoV-2 viral copies this assay can detect is 131 copies/mL. A negative result does not preclude SARS-Cov-2 infection and should not be used as the sole basis for treatment or other patient management decisions. A negative result may occur with  improper specimen collection/handling, submission of specimen  other than nasopharyngeal swab, presence of viral mutation(s) within the areas targeted by this assay, and inadequate number of viral copies (<131 copies/mL). A negative result must be combined with clinical observations, patient history, and epidemiological information. The expected result is Negative. Fact Sheet for Patients:  https://www.moore.com/ Fact Sheet for Healthcare Providers:  https://www.young.biz/ This test is not yet ap proved or cleared by the [place] FDA and  has been authorized for detection and/or diagnosis of SARS-CoV-2 by FDA under an Emergency Use Authorization (EUA). This EUA will remain  in effect (meaning this test can be used) for the duration of the COVID-19 declaration under Section 564(b)(1) of the Act, 21 U.S.C. section 360bbb-3(b)(1), unless the authorization is terminated or revoked sooner.    Influenza A by PCR NEGATIVE NEGATIVE Final   Influenza B by PCR NEGATIVE NEGATIVE Final    Comment: (NOTE) The Xpert Xpress SARS-CoV-2/FLU/RSV assay is intended as an aid in  the diagnosis of influenza from Nasopharyngeal swab specimens and  should not be used as a sole basis for treatment. Nasal washings and  aspirates are unacceptable for Xpert Xpress SARS-CoV-2/FLU/RSV  testing. Fact Sheet for Patients: https://www.moore.com/ Fact Sheet for Healthcare Providers: https://www.young.biz/ This test is not yet approved or cleared by the [place] FDA and  has been authorized for detection and/or diagnosis of SARS-CoV-2 by  FDA under an Emergency Use Authorization (EUA). This EUA will remain  in effect (meaning this test can be used) for the duration of the  Covid-19 declaration under Section 564(b)(1) of the Act, 21  U.S.C. section 360bbb-3(b)(1), unless the authorization is  terminated or revoked. Performed at [facility], [address] [place], [place]  [address]   CULTURE, BLOOD (ROUTINE X 2) w Reflex to ID Panel     Status: None   Collection Time: 10/15/19 12:44 AM   Specimen: BLOOD  Result Value Ref Range Status   Specimen Description BLOOD BLOOD RIGHT HAND  Final   Special Requests   Final    BOTTLES DRAWN AEROBIC AND ANAEROBIC Blood Culture adequate volume   Culture   Final    NO GROWTH 5 DAYS Performed at [facility], [address] [place], [place] [address]    Report Status 10/20/2019 FINAL  Final  CULTURE, BLOOD (ROUTINE X 2) w Reflex to ID Panel     Status: None   Collection Time: 10/15/19 12:56 AM   Specimen: BLOOD  Result Value Ref Range Status   Specimen Description BLOOD LEFT ANTECUBITAL  Final   Special Requests   Final    BOTTLES DRAWN AEROBIC AND ANAEROBIC Blood Culture adequate volume   Culture   Final    NO GROWTH 5 DAYS Performed at [facility], [address] [place], [place] [address]    Report Status 10/20/2019 FINAL  Final  MRSA PCR Screening     Status: None   Collection Time: 10/16/19  8:15 AM   Specimen: Nasopharyngeal  Result Value Ref Range Status   MRSA by PCR NEGATIVE NEGATIVE Final    Comment:        The GeneXpert MRSA Assay (FDA approved for NASAL specimens only), is one component of a comprehensive MRSA colonization surveillance program. It is not intended to diagnose MRSA infection nor to guide or monitor treatment for MRSA infections. Performed at [facility], [address] [place], [place] [address]      Labs: BNP (last 3 results) Recent Labs    10/14/19 2116  BNP 358.0*   Basic Metabolic Panel: Recent Labs  Lab 10/15/19 0044 10/16/19 0834 10/17/19 0408 10/18/19 0238 10/19/19 0749  NA 133* 132* 135 136 135  K 3.4* 3.0* 3.1* 2.9* 3.6  CL 97* 96* 98 96* 100  CO2 23 25 26 28 25   GLUCOSE 135* 105* 90 90 82  BUN 10 7 9 13 12   CREATININE 0.98 0.76 0.81 0.63 0.70  CALCIUM 8.8* 8.3* 8.5* 8.9 8.7*  MG  --  1.7 1.7 1.9  --    Liver  Function Tests: Recent Labs  Lab 10/15/19 0044 10/19/19 0749  AST 40 20  ALT 39 17  ALKPHOS 115 222*  BILITOT 1.3* 0.9  PROT 6.9 6.7  ALBUMIN 3.7 3.1*   No results for input(s): LIPASE, AMYLASE in the last 168 hours. No results for input(s): AMMONIA in the last 168 hours. CBC: Recent Labs  Lab 10/15/19 0044 10/16/19 0834 10/17/19 0408 10/18/19 0238 10/19/19 0749  WBC 6.3 6.2 7.7 7.4 8.6  NEUTROABS 5.6  --   --   --   --   HGB 12.0 11.2* 11.7* 11.4* 12.0  HCT 36.2 33.2* 34.6* 34.3* 35.9*  MCV 93.1 91.2 89.4 91.5 90.7  PLT 121* 124* 129* 148* 212   Cardiac Enzymes: No results for input(s): CKTOTAL, CKMB, CKMBINDEX, TROPONINI in the last 168 hours. BNP: Invalid input(s): POCBNP CBG: No results for input(s): GLUCAP in the last 168 hours. D-Dimer No results for input(s): DDIMER in the last 72 hours. Hgb A1c No results for input(s): HGBA1C in the last 72 hours. Lipid Profile No results for input(s): CHOL, HDL, LDLCALC, TRIG, CHOLHDL, LDLDIRECT in the last 72 hours. Thyroid function studies No results for input(s): TSH, T4TOTAL, T3FREE, THYROIDAB in the last 72 hours.  Invalid input(s): FREET3 Anemia work up No results for input(s): VITAMINB12, FOLATE, FERRITIN, TIBC, IRON, RETICCTPCT in the last 72 hours. Urinalysis    Component Value Date/Time   COLORURINE YELLOW (A) 10/12/2019 1408   APPEARANCEUR CLEAR (A) 10/12/2019 1408   APPEARANCEUR Cloudy (A) 08/12/2016 1108   LABSPEC 1.015 10/12/2019 1408   LABSPEC 1.004 12/20/2013 1115   PHURINE 6.0 10/12/2019 1408   GLUCOSEU NEGATIVE 10/12/2019 1408  GLUCOSEU Negative 12/20/2013 1115   HGBUR NEGATIVE 10/12/2019 1408   BILIRUBINUR NEGATIVE 10/12/2019 1408   BILIRUBINUR Negative 08/12/2016 1108   BILIRUBINUR Negative 12/20/2013 1115   KETONESUR NEGATIVE 10/12/2019 1408   PROTEINUR NEGATIVE 10/12/2019 1408   NITRITE NEGATIVE 10/12/2019 1408   LEUKOCYTESUR NEGATIVE 10/12/2019 1408   LEUKOCYTESUR Negative 12/20/2013  1115   Sepsis Labs Invalid input(s): PROCALCITONIN,  WBC,  LACTICIDVEN Microbiology Recent Results (from the past 240 hour(s))  Anaerobic culture     Status: None   Collection Time: 10/13/19  5:25 AM   Specimen: PATH Other; Tissue  Result Value Ref Range Status   Specimen Description   Final    URINE, RANDOM Performed at [facility], [address] [place], [place] [address]    Special Requests   Final    NONE Performed at [facility], [address], [place], [place] [address]    Culture   Final    NO ANAEROBES ISOLATED Performed at [facility], [address], [place], [place] [address]    Report Status 10/18/2019 FINAL  Final  Urine Culture     Status: Abnormal   Collection Time: 10/13/19  5:25 AM   Specimen: Urine, Random  Result Value Ref Range Status   Specimen Description URINE, RANDOM  Final   Special Requests NONE  Final   Culture >=100,000 COLONIES/mL ESCHERICHIA COLI (A)  Final   Report Status 10/15/2019 FINAL  Final   Organism ID, Bacteria ESCHERICHIA COLI (A)  Final      Susceptibility   Escherichia coli - MIC*    AMPICILLIN >=32 RESISTANT Resistant     CEFAZOLIN <=4 SENSITIVE Sensitive     CEFTRIAXONE <=0.25 SENSITIVE Sensitive     CIPROFLOXACIN >=4 RESISTANT Resistant     GENTAMICIN <=1 SENSITIVE Sensitive     IMIPENEM <=0.25 SENSITIVE Sensitive     NITROFURANTOIN 64 INTERMEDIATE Intermediate     TRIMETH/SULFA >=320 RESISTANT Resistant     AMPICILLIN/SULBACTAM >=32 RESISTANT Resistant     PIP/TAZO <=4 SENSITIVE Sensitive     * >=100,000 COLONIES/mL ESCHERICHIA COLI  Respiratory Panel by RT PCR (Flu A&B, Covid) - Nasopharyngeal Swab     Status: None   Collection Time: 10/13/19  6:33 AM   Specimen: Nasopharyngeal Swab  Result Value Ref Range Status   SARS Coronavirus 2 by RT PCR NEGATIVE NEGATIVE Final    Comment: (NOTE) SARS-CoV-2 target nucleic acids are NOT DETECTED. The SARS-CoV-2 RNA is generally detectable in upper  respiratoy specimens during the acute phase of infection. The lowest concentration of SARS-CoV-2 viral copies this assay can detect is 131 copies/mL. A negative result does not preclude SARS-Cov-2 infection and should not be used as the sole basis for treatment or other patient management decisions. A negative result may occur with  improper specimen collection/handling, submission of specimen other than nasopharyngeal swab, presence of viral mutation(s) within the areas targeted by this assay, and inadequate number of viral copies (<131 copies/mL). A negative result must be combined with clinical observations, patient history, and epidemiological information. The expected result is Negative. Fact Sheet for Patients:  https://www.moore.com/ Fact Sheet for Healthcare Providers:  https://www.young.biz/ This test is not yet ap proved or cleared by the [place] FDA and  has been authorized for detection and/or diagnosis of SARS-CoV-2 by FDA under an Emergency Use Authorization (EUA). This EUA will remain  in effect (meaning this test can be used) for the duration of the COVID-19 declaration under Section 564(b)(1)  of the Act, 21 U.S.C. section 360bbb-3(b)(1), unless the authorization is terminated or revoked sooner.    Influenza A by PCR NEGATIVE NEGATIVE Final   Influenza B by PCR NEGATIVE NEGATIVE Final    Comment: (NOTE) The Xpert Xpress SARS-CoV-2/FLU/RSV assay is intended as an aid in  the diagnosis of influenza from Nasopharyngeal swab specimens and  should not be used as a sole basis for treatment. Nasal washings and  aspirates are unacceptable for Xpert Xpress SARS-CoV-2/FLU/RSV  testing. Fact Sheet for Patients: https://www.moore.com/ Fact Sheet for Healthcare Providers: https://www.young.biz/ This test is not yet approved or cleared by the [place] FDA and  has been authorized for  detection and/or diagnosis of SARS-CoV-2 by  FDA under an Emergency Use Authorization (EUA). This EUA will remain  in effect (meaning this test can be used) for the duration of the  Covid-19 declaration under Section 564(b)(1) of the Act, 21  U.S.C. section 360bbb-3(b)(1), unless the authorization is  terminated or revoked. Performed at [facility], [address] [place], [place] [address]   CULTURE, BLOOD (ROUTINE X 2) w Reflex to ID Panel     Status: None   Collection Time: 10/15/19 12:44 AM   Specimen: BLOOD  Result Value Ref Range Status   Specimen Description BLOOD BLOOD RIGHT HAND  Final   Special Requests   Final    BOTTLES DRAWN AEROBIC AND ANAEROBIC Blood Culture adequate volume   Culture   Final    NO GROWTH 5 DAYS Performed at [facility], [address] [place], [place] [address]    Report Status 10/20/2019 FINAL  Final  CULTURE, BLOOD (ROUTINE X 2) w Reflex to ID Panel     Status: None   Collection Time: 10/15/19 12:56 AM   Specimen: BLOOD  Result Value Ref Range Status   Specimen Description BLOOD LEFT ANTECUBITAL  Final   Special Requests   Final    BOTTLES DRAWN AEROBIC AND ANAEROBIC Blood Culture adequate volume   Culture   Final    NO GROWTH 5 DAYS Performed at [facility], [address] [place], [place] [address]    Report Status 10/20/2019 FINAL  Final  MRSA PCR Screening     Status: None   Collection Time: 10/16/19  8:15 AM   Specimen: Nasopharyngeal  Result Value Ref Range Status   MRSA by PCR NEGATIVE NEGATIVE Final    Comment:        The GeneXpert MRSA Assay (FDA approved for NASAL specimens only), is one component of a comprehensive MRSA colonization surveillance program. It is not intended to diagnose MRSA infection nor to guide or monitor treatment for MRSA infections. Performed at [facility], [address] [place], [place] [address]      Time coordinating discharge: 25 minutes The [place]  controlled substances registry was reviewed for this patient prior to filling the <5 days supply controlled substances script.      SIGNED:   Alberteen Sam, MD  [facility] 10/20/2019, 8:20 AM

## 2019-10-21 ENCOUNTER — Encounter (HOSPITAL_COMMUNITY): Payer: Self-pay | Admitting: Registered Nurse

## 2019-10-21 ENCOUNTER — Telehealth: Payer: Self-pay | Admitting: Urology

## 2019-10-21 NOTE — Telephone Encounter (Signed)
Left message for pt. To call Tacey Ruiz to discuss surgery to be added on Friday 10/25/19 with Dr. Richardo Hanks.

## 2019-10-22 ENCOUNTER — Ambulatory Visit: Payer: Medicaid Other | Admitting: Physician Assistant

## 2019-10-23 ENCOUNTER — Other Ambulatory Visit: Admission: RE | Admit: 2019-10-23 | Payer: Medicaid Other | Source: Ambulatory Visit

## 2019-10-23 NOTE — Telephone Encounter (Signed)
Patient states she has not picked up prescriptions for antibiotic or pain medication from the pharmacy after discharge from the hospital. Awaiting recommendations from Dr Richardo Hanks.

## 2019-10-23 NOTE — Telephone Encounter (Signed)
LMOM for patient to return call to discuss surgery with Dr Richardo Hanks for kidney stone.

## 2019-10-24 ENCOUNTER — Other Ambulatory Visit: Admission: RE | Admit: 2019-10-24 | Payer: Medicaid Other | Source: Ambulatory Visit

## 2019-10-24 ENCOUNTER — Other Ambulatory Visit: Payer: Self-pay

## 2019-10-28 ENCOUNTER — Telehealth: Payer: Self-pay | Admitting: Urology

## 2019-10-28 NOTE — Telephone Encounter (Signed)
LMOM for pt. To return my call and discuss scheduled surgery. Need to make sure pt. Started her abx. Pt. Needs to come in on Wednesday 10/30/19 for Covid test.

## 2019-10-29 NOTE — Telephone Encounter (Signed)
LMOM for pt.to call office today. In order to proceed with Surgery on Friday 11/01/19 she must have a Covid test done tomorrow 10/30/19. Tried to call contact person (mother) phone is no longer in service.

## 2019-10-30 ENCOUNTER — Other Ambulatory Visit
Admission: RE | Admit: 2019-10-30 | Discharge: 2019-10-30 | Disposition: A | Discharge status: Home / Self Care | Payer: Medicaid Other | Source: Ambulatory Visit | Attending: Urology | Admitting: Urology

## 2019-10-30 NOTE — Progress Notes (Signed)
Pt did not show for covid test - LM for patient reminding her of appt.  Other contact number is no longer in service.

## 2019-10-30 NOTE — Telephone Encounter (Signed)
[facility] could not reach pt. Today to have her come in for Covid test and pt. Has not returned any phone calls to the office to schedule her surgery.

## 2019-11-01 ENCOUNTER — Ambulatory Visit: Admission: RE | Admit: 2019-11-01 | Payer: Medicaid Other | Source: Home / Self Care | Admitting: Urology

## 2019-11-01 ENCOUNTER — Encounter: Admission: RE | Payer: Self-pay | Source: Home / Self Care

## 2019-11-01 SURGERY — CYSTOSCOPY/URETEROSCOPY/HOLMIUM LASER/STENT PLACEMENT
Anesthesia: Choice | Laterality: Left

## 2019-12-29 ENCOUNTER — Other Ambulatory Visit: Payer: Self-pay

## 2019-12-29 DIAGNOSIS — R109 Unspecified abdominal pain: Secondary | ICD-10-CM | POA: Diagnosis not present

## 2019-12-29 DIAGNOSIS — N200 Calculus of kidney: Secondary | ICD-10-CM | POA: Insufficient documentation

## 2019-12-29 DIAGNOSIS — Z5321 Procedure and treatment not carried out due to patient leaving prior to being seen by health care provider: Secondary | ICD-10-CM | POA: Diagnosis not present

## 2019-12-29 LAB — CBC
HCT: 41.2 % (ref 36.0–46.0)
Hemoglobin: 13.9 g/dL (ref 12.0–15.0)
MCH: 30.7 pg (ref 26.0–34.0)
MCHC: 33.7 g/dL (ref 30.0–36.0)
MCV: 90.9 fL (ref 80.0–100.0)
Platelets: 349 10*3/uL (ref 150–400)
RBC: 4.53 MIL/uL (ref 3.87–5.11)
RDW: 12.5 % (ref 11.5–15.5)
WBC: 12.3 10*3/uL — ABNORMAL HIGH (ref 4.0–10.5)
nRBC: 0 % (ref 0.0–0.2)

## 2019-12-29 LAB — BASIC METABOLIC PANEL
Anion gap: 9 (ref 5–15)
BUN: 14 mg/dL (ref 6–20)
CO2: 22 mmol/L (ref 22–32)
Calcium: 9.6 mg/dL (ref 8.9–10.3)
Chloride: 106 mmol/L (ref 98–111)
Creatinine, Ser: 0.96 mg/dL (ref 0.44–1.00)
GFR calc Af Amer: >60 mL/min (ref >60)
GFR calc non Af Amer: >60 mL/min (ref >60)
Glucose, Bld: 112 mg/dL — ABNORMAL HIGH (ref 70–99)
Potassium: 3.9 mmol/L (ref 3.5–5.1)
Sodium: 137 mmol/L (ref 135–145)

## 2019-12-29 NOTE — ED Notes (Signed)
Pt ambulated to restroom without assistance.

## 2019-12-29 NOTE — ED Triage Notes (Signed)
Pt arrives EMS from home for L flank pain. States she had "kidney surgery" 17 days ago but unable to tell this RN what she had done. She knows she has a stent in her kidney. States "I feel like my kidney is swelling." states last urination was "3 drops" A&O, hunched over in wheelchair. States "I didn't feel like driving to [place]."

## 2019-12-29 NOTE — ED Triage Notes (Signed)
First RN Note: Pt presents to ED via [facility] from home with c/o flank pain, per EMS pt had stent placed for kidney stone approx 17 days ago and then had the stent pulled approx 14 days ago. Per EMS pt with worsening pain since yesterday morning. Per EMS pt with hx of multiple stones.   VSS per EMS.

## 2019-12-30 ENCOUNTER — Emergency Department
Admission: EM | Admit: 2019-12-30 | Discharge: 2019-12-30 | Disposition: A | Discharge status: Home / Self Care | Payer: Medicaid Other | Attending: Emergency Medicine | Admitting: Emergency Medicine

## 2019-12-30 ENCOUNTER — Emergency Department: Payer: Medicaid Other

## 2019-12-30 NOTE — ED Notes (Signed)
No answer when called for a treatment room.

## 2019-12-30 NOTE — ED Notes (Signed)
No answer when called to go for CT scan

## 2020-04-01 ENCOUNTER — Other Ambulatory Visit: Payer: Self-pay | Admitting: Podiatry

## 2020-04-13 ENCOUNTER — Other Ambulatory Visit: Payer: Medicaid Other

## 2020-04-27 ENCOUNTER — Other Ambulatory Visit: Payer: Medicaid Other | Attending: Ophthalmology

## 2020-04-28 ENCOUNTER — Other Ambulatory Visit: Payer: Self-pay

## 2020-04-28 ENCOUNTER — Encounter: Payer: Self-pay | Admitting: Podiatry

## 2020-04-29 ENCOUNTER — Other Ambulatory Visit: Payer: Self-pay

## 2020-04-29 ENCOUNTER — Encounter
Admission: RE | Disposition: A | Discharge status: Home / Self Care | Payer: Self-pay | Source: Home / Self Care | Attending: Podiatry

## 2020-04-29 ENCOUNTER — Ambulatory Visit
Admission: RE | Admit: 2020-04-29 | Discharge: 2020-04-29 | Disposition: A | Discharge status: Home / Self Care | Payer: Medicaid Other | Attending: Podiatry | Admitting: Podiatry

## 2020-04-29 ENCOUNTER — Ambulatory Visit: Payer: Medicaid Other | Admitting: Anesthesiology

## 2020-04-29 ENCOUNTER — Encounter: Payer: Self-pay | Admitting: Podiatry

## 2020-04-29 DIAGNOSIS — Z880 Allergy status to penicillin: Secondary | ICD-10-CM | POA: Insufficient documentation

## 2020-04-29 DIAGNOSIS — Z888 Allergy status to other drugs, medicaments and biological substances status: Secondary | ICD-10-CM | POA: Diagnosis not present

## 2020-04-29 DIAGNOSIS — Z881 Allergy status to other antibiotic agents status: Secondary | ICD-10-CM | POA: Diagnosis not present

## 2020-04-29 DIAGNOSIS — J449 Chronic obstructive pulmonary disease, unspecified: Secondary | ICD-10-CM | POA: Insufficient documentation

## 2020-04-29 DIAGNOSIS — Z885 Allergy status to narcotic agent status: Secondary | ICD-10-CM | POA: Diagnosis not present

## 2020-04-29 DIAGNOSIS — Z86718 Personal history of other venous thrombosis and embolism: Secondary | ICD-10-CM | POA: Diagnosis not present

## 2020-04-29 DIAGNOSIS — F17210 Nicotine dependence, cigarettes, uncomplicated: Secondary | ICD-10-CM | POA: Insufficient documentation

## 2020-04-29 DIAGNOSIS — M2012 Hallux valgus (acquired), left foot: Secondary | ICD-10-CM | POA: Insufficient documentation

## 2020-04-29 HISTORY — PX: BUNIONECTOMY: SHX129

## 2020-04-29 HISTORY — DX: Other amnesia: R41.3

## 2020-04-29 HISTORY — DX: Other abnormalities of gait and mobility: R26.89

## 2020-04-29 SURGERY — BUNIONECTOMY
Anesthesia: General | Site: Foot | Laterality: Left

## 2020-04-29 MED ORDER — GLYCOPYRROLATE 0.2 MG/ML IJ SOLN
INTRAMUSCULAR | Status: DC | PRN
  Administered 2020-04-29: .1 mg via INTRAVENOUS

## 2020-04-29 MED ORDER — LIDOCAINE HCL (CARDIAC) PF 100 MG/5ML IV SOSY
PREFILLED_SYRINGE | INTRAVENOUS | Status: DC | PRN
  Administered 2020-04-29: 50 mg via INTRATRACHEAL

## 2020-04-29 MED ORDER — OXYCODONE HCL 5 MG/5ML PO SOLN: 5.0000 mg | Freq: Once | ORAL | Status: AC | PRN

## 2020-04-29 MED ORDER — CYCLOBENZAPRINE HCL 10 MG PO TABS: 10.0000 mg | ORAL_TABLET | Freq: Three times a day (TID) | ORAL | 0 refills | Status: DC | PRN

## 2020-04-29 MED ORDER — SCOPOLAMINE 1 MG/3DAYS TD PT72
1.0000 | MEDICATED_PATCH | Freq: Once | TRANSDERMAL | Status: DC
  Administered 2020-04-29: 1.5 mg via TRANSDERMAL

## 2020-04-29 MED ORDER — MIDAZOLAM HCL 5 MG/5ML IJ SOLN
INTRAMUSCULAR | Status: DC | PRN
  Administered 2020-04-29: 2 mg via INTRAVENOUS

## 2020-04-29 MED ORDER — LACTATED RINGERS IV SOLN: INTRAVENOUS | Status: DC

## 2020-04-29 MED ORDER — BUPIVACAINE LIPOSOME 1.3 % IJ SUSP
INTRAMUSCULAR | Status: DC | PRN
  Administered 2020-04-29: 5 mL
  Administered 2020-04-29: 15 mL

## 2020-04-29 MED ORDER — BUPIVACAINE HCL (PF) 0.25 % IJ SOLN: INTRAMUSCULAR | Status: DC | PRN

## 2020-04-29 MED ORDER — DEXAMETHASONE SODIUM PHOSPHATE 4 MG/ML IJ SOLN
INTRAMUSCULAR | Status: DC | PRN
  Administered 2020-04-29: 4 mg via INTRAVENOUS

## 2020-04-29 MED ORDER — PROPOFOL 10 MG/ML IV BOLUS
INTRAVENOUS | Status: DC | PRN
  Administered 2020-04-29: 120 mg via INTRAVENOUS

## 2020-04-29 MED ORDER — POVIDONE-IODINE 7.5 % EX SOLN: Freq: Once | CUTANEOUS | Status: AC

## 2020-04-29 MED ORDER — FENTANYL CITRATE (PF) 100 MCG/2ML IJ SOLN: 25.0000 ug | INTRAMUSCULAR | Status: DC | PRN

## 2020-04-29 MED ORDER — OXYCODONE HCL 5 MG PO TABS
5.0000 mg | ORAL_TABLET | Freq: Once | ORAL | Status: AC | PRN
  Administered 2020-04-29: 5 mg via ORAL

## 2020-04-29 MED ORDER — ONDANSETRON HCL 4 MG/2ML IJ SOLN: 4.0000 mg | Freq: Once | INTRAMUSCULAR | Status: DC | PRN

## 2020-04-29 MED ORDER — CLINDAMYCIN PHOSPHATE 900 MG/50ML IV SOLN
900.0000 mg | INTRAVENOUS | Status: AC
  Administered 2020-04-29: 900 mg via INTRAVENOUS

## 2020-04-29 MED ORDER — FENTANYL CITRATE (PF) 100 MCG/2ML IJ SOLN
INTRAMUSCULAR | Status: DC | PRN
  Administered 2020-04-29: 50 ug via INTRAVENOUS

## 2020-04-29 MED ORDER — ONDANSETRON HCL 4 MG/2ML IJ SOLN
INTRAMUSCULAR | Status: DC | PRN
  Administered 2020-04-29: 4 mg via INTRAVENOUS

## 2020-04-29 MED ORDER — ACETAMINOPHEN 160 MG/5ML PO SOLN: 325.0000 mg | ORAL | Status: DC | PRN

## 2020-04-29 MED ORDER — EPHEDRINE SULFATE 50 MG/ML IJ SOLN
INTRAMUSCULAR | Status: DC | PRN
  Administered 2020-04-29 (×2): 5 mg via INTRAVENOUS
  Administered 2020-04-29: 10 mg via INTRAVENOUS
  Administered 2020-04-29 (×2): 5 mg via INTRAVENOUS

## 2020-04-29 MED ORDER — BUPIVACAINE HCL (PF) 0.25 % IJ SOLN
INTRAMUSCULAR | Status: DC | PRN
  Administered 2020-04-29: 25 mL
  Administered 2020-04-29: 5 mL

## 2020-04-29 MED ORDER — ACETAMINOPHEN 325 MG PO TABS: 325.0000 mg | ORAL_TABLET | ORAL | Status: DC | PRN

## 2020-04-29 SURGICAL SUPPLY — 43 items
APL SKNCLS STERI-STRIP NONHPOA (GAUZE/BANDAGES/DRESSINGS) ×2
BENZOIN TINCTURE PRP APPL 2/3 (GAUZE/BANDAGES/DRESSINGS) ×3 IMPLANT
BLADE OSC/SAGITTAL MD 5.5X18 (BLADE) IMPLANT
BLADE SAW LAPIPLASTY 40X11 (INSTRUMENTS) ×2 IMPLANT
BLADE SURG 15 STRL LF DISP TIS (BLADE) ×1 IMPLANT
BLADE SURG 15 STRL SS (BLADE) ×3
BNDG CMPR 75X41 PLY HI ABS (GAUZE/BANDAGES/DRESSINGS) ×2
BNDG COHESIVE 4X5 TAN STRL (GAUZE/BANDAGES/DRESSINGS) ×3 IMPLANT
BNDG ELASTIC 4X5.8 VLCR STR LF (GAUZE/BANDAGES/DRESSINGS) ×3 IMPLANT
BNDG ESMARK 4X12 TAN STRL LF (GAUZE/BANDAGES/DRESSINGS) ×3 IMPLANT
BNDG GAUZE 4.5X4.1 6PLY STRL (MISCELLANEOUS) ×3 IMPLANT
BNDG STRETCH 4X75 STRL LF (GAUZE/BANDAGES/DRESSINGS) ×3 IMPLANT
CONTROL 360 (Bone Implant) ×2 IMPLANT
COVER LIGHT HANDLE UNIVERSAL (MISCELLANEOUS) ×6 IMPLANT
CUFF TOURN SGL QUICK 18X4 (TOURNIQUET CUFF) ×2 IMPLANT
DRAPE FLUOR MINI C-ARM 54X84 (DRAPES) ×3 IMPLANT
DURAPREP 26ML APPLICATOR (WOUND CARE) ×3 IMPLANT
ELECT REM PT RETURN 9FT ADLT (ELECTROSURGICAL) ×3
ELECTRODE REM PT RTRN 9FT ADLT (ELECTROSURGICAL) ×2 IMPLANT
GAUZE SPONGE 4X4 12PLY STRL (GAUZE/BANDAGES/DRESSINGS) ×3 IMPLANT
GAUZE XEROFORM 1X8 LF (GAUZE/BANDAGES/DRESSINGS) ×3 IMPLANT
GLOVE BIO SURGEON STRL SZ7.5 (GLOVE) ×3 IMPLANT
GLOVE INDICATOR 8.0 STRL GRN (GLOVE) ×3 IMPLANT
GOWN STRL REUS W/ TWL LRG LVL3 (GOWN DISPOSABLE) ×4 IMPLANT
GOWN STRL REUS W/TWL LRG LVL3 (GOWN DISPOSABLE) ×6
K-WIRE DBL END TROCAR 6X.045 (WIRE)
K-WIRE DBL END TROCAR 6X.062 (WIRE)
KIT TURNOVER KIT A (KITS) ×3 IMPLANT
KWIRE DBL END TROCAR 6X.045 (WIRE) IMPLANT
KWIRE DBL END TROCAR 6X.062 (WIRE) IMPLANT
MANIFOLD 4PT FOR NEPTUNE1 (MISCELLANEOUS) ×2 IMPLANT
NS IRRIG 500ML POUR BTL (IV SOLUTION) ×3 IMPLANT
PACK EXTREMITY ARMC (MISCELLANEOUS) ×3 IMPLANT
PENCIL SMOKE EVACUATOR (MISCELLANEOUS) ×3 IMPLANT
RASP SM TEAR CROSS CUT (RASP) ×2 IMPLANT
STOCKINETTE IMPERVIOUS LG (DRAPES) ×3 IMPLANT
STRIP CLOSURE SKIN 1/4X4 (GAUZE/BANDAGES/DRESSINGS) ×3 IMPLANT
SUT MNCRL+ 5-0 UNDYED PC-3 (SUTURE) ×1 IMPLANT
SUT MONOCRYL 5-0 (SUTURE) ×1
SUT VIC AB 3-0 SH 27 (SUTURE) ×3
SUT VIC AB 3-0 SH 27X BRD (SUTURE) ×1 IMPLANT
SUT VIC AB 4-0 RB1 27 (SUTURE) ×3
SUT VIC AB 4-0 RB1 27X BRD (SUTURE) ×1 IMPLANT

## 2020-04-29 NOTE — Anesthesia Postprocedure Evaluation (Signed)
Anesthesia Post Note  Patient: Teresa Davenport  Procedure(s) Performed: LAPIDUS-TYPE LEFT (Left Foot)     Patient location during evaluation: PACU Anesthesia Type: General Level of consciousness: awake and alert and oriented Pain management: satisfactory to patient Vital Signs Assessment: post-procedure vital signs reviewed and stable Respiratory status: spontaneous breathing, nonlabored ventilation and respiratory function stable Cardiovascular status: blood pressure returned to baseline and stable Postop Assessment: Adequate PO intake and No signs of nausea or vomiting Anesthetic complications: no   No complications documented.  Cherly [place]

## 2020-04-29 NOTE — Transfer of Care (Signed)
Immediate Anesthesia Transfer of Care Note  Patient: Teresa Davenport  Procedure(s) Performed: LAPIDUS-TYPE LEFT (Left Foot)  Patient Location: PACU  Anesthesia Type: General LMA  Level of Consciousness: awake, alert  and patient cooperative  Airway and Oxygen Therapy: Patient Spontanous Breathing and Patient connected to supplemental oxygen  Post-op Assessment: Post-op Vital signs reviewed, Patient's Cardiovascular Status Stable, Respiratory Function Stable, Patent Airway and No signs of Nausea or vomiting  Post-op Vital Signs: Reviewed and stable  Complications: No complications documented.

## 2020-04-29 NOTE — Op Note (Signed)
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 that presents today for surgical intervention.  The risks/benefits/alternatives/complications have been discussed and consent has been given.    Procedure:  Patient was brought into the OR and placed on the operating table in thesupine position. After anesthesia was obtained theleft lower extremity was prepped and draped in usual sterile fashion.    Attention was directed to the dorsal medial first met Form joint where incision was performed.  Sharp and blunt dissection carried down to the joint region.  The joint was then freed of all soft tissue.  Attention was directed to the first MTPJ where the dorsomedial incision was performed.  Sharp and blunt dissected down to the capsule.  The intermetatarsal space was entered.  The DTI L was then transected.  The conjoined tendon of the abductor was noted and transected off the base of the proximal phalanx.  Good realignment of the first MTPJ was noted.  Attention was redirected to the first metatarsocuneiform joint.  The fulcrum was placed at the base.  The joint positioner was placed and used to decrease the intermetatarsal angle.  The joint seeker was placed and the cut guide was placed over the first met cuneiform joint.  2 converging cuts were performed.  The articular cartilage was removed.  No residual bone was noted.  The joints were then prepared with a 2.0 mm drill bit.  Compression was applied and then stabilized with a compression olive wire and stabilizing K wire.  Excellent compression was noted and good  realignment was noted.  The medial and dorsal plates were applied and the standard fashion.  Good realignment at the MTPJ was noted.  A small capsulorrhaphy was performed medially.  At this time all areas were then flushed with copious amounts of irrigation.  Closure was performed with a 3-0 Vicryl the deeper tissue.  4-0 Vicryl for subcutaneous tissue and a 5-0 Monocryl for the skin.              Patient tolerated the procedure and anesthesia well.  Was transported from the OR to the PACU with all vital signs stable and vascular status intact. To be discharged per routine protocol.  Will follow up in approximately 1 week in the outpatient clinic.

## 2020-04-29 NOTE — Discharge Instructions (Signed)
[facility]  POST OPERATIVE INSTRUCTIONS FOR DR. TROXLER, DR. FOWLER, AND DR. [facility]   1. Take your medication as prescribed.  Pain medication should be taken only as needed.  2. Keep the dressing clean, dry and intact.  3. Keep your foot elevated above the heart level for the first 48 hours.  4. Walking to the bathroom and brief periods of walking are acceptable, unless we have instructed you to be non-weight bearing.  5. Always wear your post-op shoe when walking.  Always use your crutches if you are to be non-weight bearing.  6. Do not take a shower. Baths are permissible as long as the foot is kept out of the water.   7. Every hour you are awake:  - Bend your knee 15 times. - Flex foot 15 times - Massage calf 15 times  8. Call [facility] ([phone]) if any of the following problems occur: - You develop a temperature or fever. - The bandage becomes saturated with blood. - Medication does not stop your pain. - Injury of the foot occurs. - Any symptoms of infection including redness, odor, or red streaks running from wound. -  General Anesthesia, Adult, Care After This sheet gives you information about how to care for yourself after your procedure. Your health care provider may also give you more specific instructions. If you have problems or questions, contact your health care provider. What can I expect after the procedure? After the procedure, the following side effects are common:  Pain or discomfort at the IV site.  Nausea.  Vomiting.  Sore throat.  Trouble concentrating.  Feeling cold or chills.  Weak or tired.  Sleepiness and fatigue.  Soreness and body aches. These side effects can affect parts of the body that were not involved in surgery. Follow these instructions at home:  For at least 24 hours after the procedure:  Have a responsible adult stay with you. It is  important to have someone help care for you until you are awake and alert.  Rest as needed.  Do not: ? Participate in activities in which you could fall or become injured. ? Drive. ? Use heavy machinery. ? Drink alcohol. ? Take sleeping pills or medicines that cause drowsiness. ? Make important decisions or sign legal documents. ? Take care of children on your own. Eating and drinking  Follow any instructions from your health care provider about eating or drinking restrictions.  When you feel hungry, start by eating small amounts of foods that are soft and easy to digest (bland), such as toast. Gradually return to your regular diet.  Drink enough fluid to keep your urine pale yellow.  If you vomit, rehydrate by drinking water, juice, or clear broth. General instructions  If you have sleep apnea, surgery and certain medicines can increase your risk for breathing problems. Follow instructions from your health care provider about wearing your sleep device: ? Anytime you are sleeping, including during daytime naps. ? While taking prescription pain medicines, sleeping medicines, or medicines that make you drowsy.  Return to your normal activities as told by your health care provider. Ask your health care provider what activities are safe for you.  Take over-the-counter and prescription medicines only as told by your health care provider.  If you smoke, do not smoke without supervision.  Keep all follow-up visits as told by your health care provider. This is important. Contact a health care provider if:    have nausea or vomiting that does not get better with medicine.  You cannot eat or drink without vomiting.  You have pain that does not get better with medicine.  You are unable to pass urine.  You develop a skin rash.  You have a fever.  You have redness around your IV site that gets worse. Get help right away if:  You have difficulty breathing.  You have chest  pain.  You have blood in your urine or stool, or you vomit blood. Summary  After the procedure, it is common to have a sore throat or nausea. It is also common to feel tired.  Have a responsible adult stay with you for the first 24 hours after general anesthesia. It is important to have someone help care for you until you are awake and alert.  When you feel hungry, start by eating small amounts of foods that are soft and easy to digest (bland), such as toast. Gradually return to your regular diet.  Drink enough fluid to keep your urine pale yellow.  Return to your normal activities as told by your health care provider. Ask your health care provider what activities are safe for you. This information is not intended to replace advice given to you by your health care provider. Make sure you discuss any questions you have with your health care provider. Document Revised: 06/30/2017 Document Reviewed: 02/10/2017 Elsevier Patient Education  2020 Elsevier Inc.   Scopolamine skin patches What is this medicine? SCOPOLAMINE (skoe POL a meen) is used to prevent nausea and vomiting caused by motion sickness, anesthesia and surgery. This medicine may be used for other purposes; ask your health care provider or pharmacist if you have questions. COMMON BRAND NAME(S): Transderm Scop What should I tell my health care provider before I take this medicine? They need to know if you have any of these conditions:  are scheduled to have a gastric secretion test  glaucoma  heart disease  kidney disease  liver disease  lung or breathing disease, like asthma  mental illness  prostate disease  seizures  stomach or intestine problems  trouble passing urine  an unusual or allergic reaction to scopolamine, atropine, other medicines, foods, dyes, or preservatives  pregnant or trying to get pregnant  breast-feeding How should I use this medicine? This medicine is for external use only. Follow  the directions on the prescription label. Wear only 1 patch at a time. Choose an area behind the ear, that is clean, dry, hairless and free from any cuts or irritation. Wipe the area with a clean dry tissue. Peel off the plastic backing of the skin patch, trying not to touch the adhesive side with your hands. Do not cut the patches. Firmly apply to the area you have chosen, with the metallic side of the patch to the skin and the tan-colored side showing. Once firmly in place, wash your hands well with soap and water. Do not get this medicine into your eyes. After removing the patch, wash your hands and the area behind your ear thoroughly with soap and water. The patch will still contain some medicine after use. To avoid accidental contact or ingestion by children or pets, fold the used patch in half with the sticky side together and throw away in the trash out of the reach of children and pets. If you need to use a second patch after you remove the first, place it behind the other ear. A special MedGuide will be given to  you by the pharmacist with each prescription and refill. Be sure to read this information carefully each time. Talk to your pediatrician regarding the use of this medicine in children. Special care may be needed. Overdosage: If you think you have taken too much of this medicine contact a poison control center or emergency room at once. NOTE: This medicine is only for you. Do not share this medicine with others. What if I miss a dose? This does not apply. This medicine is not for regular use. What may interact with this medicine?  alcohol  antihistamines for allergy cough and cold  atropine  certain medicines for anxiety or sleep  certain medicines for bladder problems like oxybutynin, tolterodine  certain medicines for depression like amitriptyline, fluoxetine, sertraline  certain medicines for stomach problems like dicyclomine, hyoscyamine  certain medicines for Parkinson's  disease like benztropine, trihexyphenidyl  certain medicines for seizures like phenobarbital, primidone  general anesthetics like halothane, isoflurane, methoxyflurane, propofol  ipratropium  local anesthetics like lidocaine, pramoxine, tetracaine  medicines that relax muscles for surgery  phenothiazines like chlorpromazine, mesoridazine, prochlorperazine, thioridazine  narcotic medicines for pain  other belladonna alkaloids This list may not describe all possible interactions. Give your health care provider a list of all the medicines, herbs, non-prescription drugs, or dietary supplements you use. Also tell them if you smoke, drink alcohol, or use illegal drugs. Some items may interact with your medicine. What should I watch for while using this medicine? Limit contact with water while swimming and bathing because the patch may fall off. If the patch falls off, throw it away and put a new one behind the other ear. You may get drowsy or dizzy. Do not drive, use machinery, or do anything that needs mental alertness until you know how this medicine affects you. Do not stand or sit up quickly, especially if you are an older patient. This reduces the risk of dizzy or fainting spells. Alcohol may interfere with the effect of this medicine. Avoid alcoholic drinks. Your mouth may get dry. Chewing sugarless gum or sucking hard candy, and drinking plenty of water may help. Contact your healthcare professional if the problem does not go away or is severe. This medicine may cause dry eyes and blurred vision. If you wear contact lenses, you may feel some discomfort. Lubricating drops may help. See your healthcare professional if the problem does not go away or is severe. If you are going to need surgery, an MRI, CT scan, or other procedure, tell your healthcare professional that you are using this medicine. You may need to remove the patch before the procedure. What side effects may I notice from  receiving this medicine? Side effects that you should report to your doctor or health care professional as soon as possible:  allergic reactions like skin rash, itching or hives; swelling of the face, lips, or tongue  blurred vision  changes in vision  confusion  dizziness  eye pain  fast, irregular heartbeat  hallucinations, loss of contact with reality  nausea, vomiting  pain or trouble passing urine  restlessness  seizures  skin irritation  stomach pain Side effects that usually do not require medical attention (report to your doctor or health care professional if they continue or are bothersome):  drowsiness  dry mouth  headache  sore throat This list may not describe all possible side effects. Call your doctor for medical advice about side effects. You may report side effects to FDA at 1-800-FDA-1088. Where should I keep  my medicine? Keep out of the reach of children. Store at room temperature between 20 and 25 degrees C (68 and 77 degrees F). Keep this medicine in the foil package until ready to use. Throw away any unused medicine after the expiration date. NOTE: This sheet is a summary. It may not cover all possible information. If you have questions about this medicine, talk to your doctor, pharmacist, or health care provider.  2020 Elsevier/Gold Standard (2017-09-15 16:14:46)

## 2020-04-29 NOTE — H&P (Signed)
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  The various methods of treatment have been discussed with the patient.  No guarantees were given.  After consideration of risks, benefits and other options for treatment, the patient has consented to surgery.  I have reviewed the patients' chart and labs.     A history and physical examination was performed in my office.  The patient was reexamined.  There have been no changes to this history and physical examination.  Gwyneth Revels A

## 2020-04-29 NOTE — Anesthesia Preprocedure Evaluation (Signed)
Anesthesia Evaluation  Patient identified by MRN, date of birth, ID band Patient awake    Reviewed: Allergy & Precautions, H&P , NPO status , Patient's Chart, lab work & pertinent test results  Airway Mallampati: II  TM Distance: >3 FB Neck ROM: full    Dental no notable dental hx.    Pulmonary COPD, Current Smoker and Patient abstained from smoking.,    Pulmonary exam normal breath sounds clear to auscultation       Cardiovascular Normal cardiovascular exam Rhythm:regular Rate:Normal     Neuro/Psych    GI/Hepatic   Endo/Other    Renal/GU Renal disease     Musculoskeletal   Abdominal   Peds  Hematology   Anesthesia Other Findings   Reproductive/Obstetrics                             Anesthesia Physical Anesthesia Plan  ASA: II  Anesthesia Plan: General LMA   Post-op Pain Management:    Induction:   PONV Risk Score and Plan: 2 and Treatment may vary due to age or medical condition, Ondansetron, Dexamethasone and Scopolamine patch - Pre-op  Airway Management Planned:   Additional Equipment:   Intra-op Plan:   Post-operative Plan:   Informed Consent: I have reviewed the patients History and Physical, chart, labs and discussed the procedure including the risks, benefits and alternatives for the proposed anesthesia with the patient or authorized representative who has indicated his/her understanding and acceptance.     Dental Advisory Given  Plan Discussed with: CRNA  Anesthesia Plan Comments:         Anesthesia Quick Evaluation

## 2020-04-29 NOTE — Anesthesia Procedure Notes (Signed)
Procedure Name: LMA Insertion Date/Time: 04/29/2020 12:24 PM Performed by: Jimmy Picket, CRNA Pre-anesthesia Checklist: Patient identified, Emergency Drugs available, Suction available, Timeout performed and Patient being monitored Patient Re-evaluated:Patient Re-evaluated prior to induction Oxygen Delivery Method: Circle system utilized Preoxygenation: Pre-oxygenation with 100% oxygen Induction Type: IV induction LMA: LMA inserted LMA Size: 4.0 Number of attempts: 1 Placement Confirmation: positive ETCO2 and breath sounds checked- equal and bilateral Tube secured with: Tape

## 2020-04-30 ENCOUNTER — Encounter: Payer: Self-pay | Admitting: Podiatry

## 2021-01-27 ENCOUNTER — Other Ambulatory Visit: Payer: Self-pay

## 2021-01-27 ENCOUNTER — Ambulatory Visit (INDEPENDENT_AMBULATORY_CARE_PROVIDER_SITE_OTHER): Payer: Medicaid Other | Admitting: Urology

## 2021-01-27 ENCOUNTER — Encounter: Payer: Self-pay | Admitting: Urology

## 2021-01-27 VITALS — BP 149/82 | HR 91 | Ht 62.0 in | Wt 150.0 lb

## 2021-01-27 DIAGNOSIS — N200 Calculus of kidney: Secondary | ICD-10-CM | POA: Diagnosis not present

## 2021-01-27 DIAGNOSIS — R109 Unspecified abdominal pain: Secondary | ICD-10-CM

## 2021-01-27 NOTE — Progress Notes (Signed)
   01/27/2021 1:21 PM   Teresa Davenport [birth year] [id number]  Reason for visit: Flank pain  HPI: 55 year old female with chronic pain and recurrent nephrolithiasis who has been followed extensively by other providers in our clinic at [facility] u[facility], as well as at [facility].  Briefly, she has a history of left distal ureteral stone with UTI that was urgently stented in April 2021 by Dr. Annabell Howells, and despite multiple attempts, she never followed up with [place] for definitive stone treatment.  She ended up being evaluated and treated at [facility], and for my review of the records it looks like in June 2021 she underwent a left PCNL.  Her stent was removed a week later.  It looks like she was again admitted in July 2021 at [facility] and CT showed a 10 mm left distal ureteral stone and she was taken to the OR where stone was noted to have passed spontaneously into the bladder.  A follow-up renal ultrasound at [facility] in October 2021 showed no hydronephrosis.  She presents today with 1 week of severe left-sided flank pain.  She has undergone at least 6 CT scans in the [facility] system since November 2021 for pain.  I personally viewed and interpreted her most recent CT from 01/25/2021 showing a 10 mm nonobstructing left lower pole kidney stone that is stable over the last 6 months, no ureteral stones, no hydronephrosis, no urologic etiology of her flank pain.  I had a very frank conversation with the patient today that I do not have a good etiology for her left-sided pain.  Her CT from just 48 hours ago is very reassuring with no hydronephrosis or ureteral stones, and the lower pole stone is unchanged over the last 6 months and I highly doubt this is the etiology of her urinary symptoms.  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  The patient left clinic abruptly after I told her I did not think stones were the etiology of her  pain.  From a urologic perspective, unfortunately I am not sure I have much to offer her in the setting of no ureteral stones or hydronephrosis, and a stable left nonobstructing lower pole stone.  It looks like in reviewing the notes she has canceled multiple surgeries previously with [facility] to address this left lower pole stone.  Would recommend following up with [facility] urology who has managed her care over the last year and performed multiple surgeries on this patient.  Sondra Come, MD  [facility] [address], [address] [place], [place] [address] [phone]

## 2021-01-28 LAB — MICROSCOPIC EXAMINATION
Bacteria, UA: NONE SEEN
RBC, Urine: >30 /hpf — AB (ref 0–2)

## 2021-01-28 LAB — URINALYSIS, COMPLETE
Bilirubin, UA: NEGATIVE
Glucose, UA: NEGATIVE
Ketones, UA: NEGATIVE
Leukocytes,UA: NEGATIVE
Nitrite, UA: NEGATIVE
Protein,UA: NEGATIVE
Specific Gravity, UA: 1.025 (ref 1.005–1.030)
Urobilinogen, Ur: 0.2 mg/dL (ref 0.2–1.0)
pH, UA: 6 (ref 5.0–7.5)

## 2021-03-09 ENCOUNTER — Other Ambulatory Visit: Payer: Self-pay

## 2021-03-09 ENCOUNTER — Emergency Department
Admission: EM | Admit: 2021-03-09 | Discharge: 2021-03-09 | Disposition: A | Discharge status: Home / Self Care | Payer: Medicaid Other | Attending: Emergency Medicine | Admitting: Emergency Medicine

## 2021-03-09 ENCOUNTER — Emergency Department: Payer: Medicaid Other

## 2021-03-09 DIAGNOSIS — R109 Unspecified abdominal pain: Secondary | ICD-10-CM | POA: Diagnosis not present

## 2021-03-09 DIAGNOSIS — Z5321 Procedure and treatment not carried out due to patient leaving prior to being seen by health care provider: Secondary | ICD-10-CM | POA: Diagnosis not present

## 2021-03-09 LAB — COMPREHENSIVE METABOLIC PANEL
ALT: 15 U/L (ref 0–44)
AST: 23 U/L (ref 15–41)
Albumin: 3.9 g/dL (ref 3.5–5.0)
Alkaline Phosphatase: 88 U/L (ref 38–126)
Anion gap: 9 (ref 5–15)
BUN: 13 mg/dL (ref 6–20)
CO2: 24 mmol/L (ref 22–32)
Calcium: 9.4 mg/dL (ref 8.9–10.3)
Chloride: 103 mmol/L (ref 98–111)
Creatinine, Ser: 0.78 mg/dL (ref 0.44–1.00)
GFR, Estimated: >60 mL/min (ref >60)
Glucose, Bld: 97 mg/dL (ref 70–99)
Potassium: 3.9 mmol/L (ref 3.5–5.1)
Sodium: 136 mmol/L (ref 135–145)
Total Bilirubin: 0.6 mg/dL (ref 0.3–1.2)
Total Protein: 7.1 g/dL (ref 6.5–8.1)

## 2021-03-09 LAB — CBC
HCT: 36.9 % (ref 36.0–46.0)
Hemoglobin: 12.5 g/dL (ref 12.0–15.0)
MCH: 30.8 pg (ref 26.0–34.0)
MCHC: 33.9 g/dL (ref 30.0–36.0)
MCV: 90.9 fL (ref 80.0–100.0)
Platelets: 260 10*3/uL (ref 150–400)
RBC: 4.06 MIL/uL (ref 3.87–5.11)
RDW: 12.6 % (ref 11.5–15.5)
WBC: 9.2 10*3/uL (ref 4.0–10.5)
nRBC: 0 % (ref 0.0–0.2)

## 2021-03-09 NOTE — ED Notes (Signed)
No answer in lobby or outside when called for repeat vitals

## 2021-03-09 NOTE — ED Notes (Signed)
Bladder scan shows of urine in bladder

## 2021-03-09 NOTE — ED Triage Notes (Signed)
Pt had stent placement and kidney stone removal yesterday at [facility], pt still having left flank pain. States also has not been able to void since yesteray.

## 2021-07-15 ENCOUNTER — Other Ambulatory Visit: Payer: Self-pay | Admitting: Otolaryngology

## 2021-07-15 DIAGNOSIS — R972 Elevated prostate specific antigen [PSA]: Secondary | ICD-10-CM

## 2021-07-15 DIAGNOSIS — E215 Disorder of parathyroid gland, unspecified: Secondary | ICD-10-CM

## 2021-07-21 ENCOUNTER — Other Ambulatory Visit: Payer: Self-pay

## 2021-07-21 ENCOUNTER — Ambulatory Visit
Admission: RE | Admit: 2021-07-21 | Discharge: 2021-07-21 | Disposition: A | Discharge status: Home / Self Care | Payer: Medicaid Other | Source: Ambulatory Visit | Attending: Otolaryngology | Admitting: Otolaryngology

## 2021-07-21 DIAGNOSIS — R972 Elevated prostate specific antigen [PSA]: Secondary | ICD-10-CM

## 2021-07-21 DIAGNOSIS — E215 Disorder of parathyroid gland, unspecified: Secondary | ICD-10-CM | POA: Diagnosis present

## 2021-07-21 DIAGNOSIS — R946 Abnormal results of thyroid function studies: Secondary | ICD-10-CM | POA: Diagnosis present

## 2021-09-20 ENCOUNTER — Ambulatory Visit
Admission: EM | Admit: 2021-09-20 | Discharge: 2021-09-20 | Disposition: A | Discharge status: Home / Self Care | Payer: Medicaid Other | Attending: Emergency Medicine | Admitting: Emergency Medicine

## 2021-09-20 ENCOUNTER — Other Ambulatory Visit: Payer: Self-pay

## 2021-09-20 DIAGNOSIS — K0889 Other specified disorders of teeth and supporting structures: Secondary | ICD-10-CM | POA: Diagnosis not present

## 2021-09-20 DIAGNOSIS — K047 Periapical abscess without sinus: Secondary | ICD-10-CM | POA: Diagnosis not present

## 2021-09-20 MED ORDER — CLINDAMYCIN HCL 300 MG PO CAPS
300.0000 mg | ORAL_CAPSULE | Freq: Three times a day (TID) | ORAL | 0 refills | Status: AC
Start: 2021-09-20 — End: 2021-09-27

## 2021-09-20 MED ORDER — OXYCODONE HCL 10 MG PO TABS: 10.0000 mg | ORAL_TABLET | Freq: Four times a day (QID) | ORAL | 0 refills | Status: AC | PRN

## 2021-09-20 MED ORDER — LIDOCAINE VISCOUS HCL 2 % MT SOLN: 15.0000 mL | OROMUCOSAL | 0 refills | Status: AC | PRN

## 2021-09-20 MED ORDER — OXYCODONE HCL 5 MG PO TABS: 5.0000 mg | ORAL_TABLET | ORAL | 0 refills | Status: DC | PRN

## 2021-09-20 NOTE — ED Provider Notes (Signed)
[facility]    CSN: [id number] Arrival date & time: 09/20/21  1635      History   Chief Complaint Chief Complaint  Patient presents with   Dental Pain    HPI Teresa Davenport is a 56 y.o. female.   Patient presents with lower right molar dental pain for 3 days.  Endorses that the crown over the molar fell off and partial tooth broke as well.  Associated right jaw swelling radiating into her neck.  Has attempted use of Tylenol which has not been effective.  Has been attempting to find a dentist but has been very difficult.  Denies fever, chills, drainage, difficulty swallowing, sore throat.      Past Medical History:  Diagnosis Date   Balance problem    Related to TBI   Breast mass 05/22/2012   Chronic cystitis 10/15/2014   Chronic pain 05/31/2012   Complication of anesthesia    hard for me to go under    Gross hematuria 09/17/2014   History of kidney stones    Hydronephrosis with urinary obstruction due to ureteral calculus 04/29/2016   Hypokalemia 03/23/2016   Kidney stone    Memory deficit    short and long term.  Related to TBI   Meningitis 2013   Pneumonia 2014   Pyelonephritis 04/17/2016   Renal colic 09/17/2014   Renal disorder    Rib fracture    Thrombosis 02/21/2013   Acute external jugular vein   Traumatic brain injury 2014   Ureterolithiasis 04/29/2016    Patient Active Problem List   Diagnosis Date Noted   Dyspnea on exertion 10/14/2019   Hypoxia 10/14/2019   Tachycardia 10/14/2019   Left ureteral stone 10/13/2019   Nephrolithiasis 07/29/2016   Hydronephrosis with urinary obstruction due to ureteral calculus 04/29/2016   Ureterolithiasis 04/29/2016   Pyelonephritis 04/17/2016   Kidney stone 04/16/2016   Hypokalemia 03/23/2016   Bladder pain 10/15/2014   Chronic cystitis 10/15/2014   Gross hematuria 09/17/2014   Renal colic 09/17/2014   Headache 10/17/2012   Pulmonary emphysema (HCC) 10/17/2012   Chronic pain 05/31/2012   Breast  mass 05/22/2012   Urinary urgency 03/08/2012    Past Surgical History:  Procedure Laterality Date   ABDOMINAL HYSTERECTOMY     BREAST SURGERY Bilateral 2016   breast implants   BUNIONECTOMY Left 04/29/2020   Procedure: LAPIDUS-TYPE LEFT;  Surgeon: Gwyneth Revels, DPM;  Location: [facility];  Service: Podiatry;  Laterality: Left;  covid + 04-06-20   CLAVICLE SURGERY     CYSTOSCOPY W/ URETERAL STENT PLACEMENT Right 05/11/2016   Procedure: CYSTOSCOPY WITH STENT REPLACEMENT;  Surgeon: Hildred Laser, MD;  Location: [facility];  Service: Urology;  Laterality: Right;   CYSTOSCOPY WITH STENT PLACEMENT N/A 04/16/2016   Procedure: CYSTOSCOPY WITH STENT PLACEMENT and retrograde pyelogram;  Surgeon: Zonia Kief, MD;  Location: [facility];  Service: Urology;  Laterality: N/A;   CYSTOSCOPY WITH URETEROSCOPY, STONE BASKETRY AND STENT PLACEMENT Left 10/13/2019   Procedure: CYSTOSCOPY WITH URETEROSCOPY, STENT PLACEMENT;  Surgeon: Bjorn Pippin, MD;  Location: [facility];  Service: Urology;  Laterality: Left;   FACIAL FRACTURE SURGERY     baseball accident- pins placed   FEMUR FRACTURE SURGERY     FRACTURE SURGERY  2001   face   IR GENERIC HISTORICAL  07/29/2016   IR NEPHROSTOMY PLACEMENT LEFT 07/29/2016 [facility]   NEPHROLITHOTOMY Left 07/29/2016   Procedure: NEPHROLITHOTOMY PERCUTANEOUS;  Surgeon: Hildred Laser, MD;  Location: [facility];  Service: Urology;  Laterality: Left;   RIB FRACTURE SURGERY  08/2015   pins and screws   URETEROSCOPY WITH HOLMIUM LASER LITHOTRIPSY Right 05/11/2016   Procedure: URETEROSCOPY WITH HOLMIUM LASER LITHOTRIPSY;  Surgeon: Hildred LaserBrian James Budzyn, MD;  Location: [facility];  Service: Urology;  Laterality: Right;    OB History   No obstetric history on file.      Home Medications    Prior to Admission medications   Medication Sig Start Date End Date Taking? Authorizing Provider  acetaminophen (TYLENOL) 500 MG tablet Take 2,000 mg by mouth every 6 (six) hours  as needed for moderate pain (patient takes 4 tablets at a time).     [provider]  albuterol (VENTOLIN HFA) 108 (90 Base) MCG/ACT inhaler Inhale into the lungs. 12/23/20 12/23/21  [provider]  estrogens, conjugated, (PREMARIN) 0.45 MG tablet Take 0.45 mg by mouth daily.     [provider]  gabapentin (NEURONTIN) 300 MG capsule Take 1 capsule (300 mg total) by mouth 3 (three) times daily for 10 days. 08/26/18 04/29/20  Phineas SemenGoodman, Graydon, MD  oxyCODONE-acetaminophen (PERCOCET) 7.5-325 MG tablet Take by mouth. 12/31/20   [provider]  potassium chloride (K-DUR) 10 MEQ tablet Take 1 tablet (10 mEq total) daily by mouth. Patient not taking: No sig reported 05/17/17   Emily FilbertWilliams, Jonathan E, MD  tretinoin (RETIN-A) 0.025 % cream RETIN-A 0.025 % EXTERNAL CREAM 09/04/17   [provider]    Family History Family History  Problem Relation Age of Onset   Kidney disease Mother    Pancreatic cancer Father    Bladder Cancer Neg Hx    Kidney cancer Neg Hx    Prostate cancer Neg Hx     Social History Social History   Tobacco Use   Smoking status: Every Day    Packs/day: 0.25    Years: 20.00    Pack years: 5.00    Types: Cigarettes   Smokeless tobacco: Never   Tobacco comments:    04/28/20 - "down to 2 cigs/day"  Vaping Use   Vaping Use: Never used  Substance Use Topics   Alcohol use: No   Drug use: No     Allergies   Haloperidol, Haloperidol and related, Ketamine, Ketorolac, Meropenem, Penicillins, Propoxyphene, Clavulanic acid, Doxycycline, Motrin [ibuprofen], Toradol [ketorolac tromethamine], Tramadol, Amoxicillin, Methadone, Naproxen sodium, and Tape   Review of Systems Review of Systems  Constitutional: Negative.   HENT:  Positive for dental problem. Negative for congestion, drooling, ear discharge, ear pain, facial swelling, hearing loss, mouth sores, nosebleeds, postnasal drip, rhinorrhea, sinus pressure, sinus pain, sneezing, sore  throat, tinnitus, trouble swallowing and voice change.   Respiratory: Negative.    Cardiovascular: Negative.     Physical Exam Triage Vital Signs ED Triage Vitals [09/20/21 1649]  Enc Vitals Group     BP (!) 178/116     Pulse Rate (!) 104     Resp 18     Temp 98.3 F (36.8 C)     Temp Source Oral     SpO2 100 %     Weight      Height      Head Circumference      Peak Flow      Pain Score      Pain Loc      Pain Edu?      Excl. in GC?    No data found.  Updated Vital Signs BP (!) 178/116 (BP Location: Left Arm)  Pulse (!) 104    Temp 98.3 F (36.8 C) (Oral)    Resp 18    SpO2 100%   Visual Acuity Right Eye Distance:   Left Eye Distance:   Bilateral Distance:    Right Eye Near:   Left Eye Near:    Bilateral Near:     Physical Exam Constitutional:      Appearance: Normal appearance.  HENT:     Head: Normocephalic.     Mouth/Throat:     Mouth: Mucous membranes are moist.     Pharynx: Oropharynx is clear.     Comments: Dental decay with abscess present to the first or second premolar, mild to moderate gingival swelling noted, right lower jaw swelling present Eyes:     Extraocular Movements: Extraocular movements intact.  Pulmonary:     Effort: Pulmonary effort is normal.  Skin:    General: Skin is warm and dry.  Neurological:     Mental Status: She is alert and oriented to person, place, and time. Mental status is at baseline.  Psychiatric:        Mood and Affect: Mood normal.        Behavior: Behavior normal.     UC Treatments / Results  Labs (all labs ordered are listed, but only abnormal results are displayed) Labs Reviewed - No data to display  EKG   Radiology No results found.  Procedures Procedures (including critical care time)  Medications Ordered in UC Medications - No data to display  Initial Impression / Assessment and Plan / UC Course  I have reviewed the triage vital signs and the nursing notes.  Pertinent labs & imaging  results that were available during my care of the patient were reviewed by me and considered in my medical decision making (see chart for details).  Dental pain Dental abscess  Clindamycin 7-day course prescribed for treatment of abscess, oxycodone immediate release and lidocaine viscous prescribed for management of pain, PDMP reviewed, low risk, given handout of local dental resources to assist in finding dentist, urgent care follow-up as needed Final Clinical Impressions(s) / UC Diagnoses   Final diagnoses:  None   Discharge Instructions   None    ED Prescriptions   None    PDMP not reviewed this encounter.   Valinda Hoar, NP 09/20/21 1740

## 2021-09-20 NOTE — ED Triage Notes (Signed)
Pt presents for dental pain and swelling.  ?

## 2021-09-20 NOTE — Discharge Instructions (Addendum)
Take clindamycin 3 times a day for the next 7 days to clear bacterial ? ?You may use oxycodone every 4 hours as needed for pain ? ?You may use lidocaine viscus, gargle and spit to give a temporary numbing effect to ? ?Inside of your packet is a list of detox total resources across the state, please reach out to continue to find dentist who may treat your symptoms ?

## 2021-11-25 ENCOUNTER — Emergency Department
Admission: EM | Admit: 2021-11-25 | Discharge: 2021-11-25 | Disposition: A | Discharge status: Home / Self Care | Payer: Medicaid Other | Attending: Emergency Medicine | Admitting: Emergency Medicine

## 2021-11-25 ENCOUNTER — Other Ambulatory Visit: Payer: Self-pay

## 2021-11-25 ENCOUNTER — Encounter: Payer: Self-pay | Admitting: Emergency Medicine

## 2021-11-25 DIAGNOSIS — N200 Calculus of kidney: Secondary | ICD-10-CM | POA: Diagnosis not present

## 2021-11-25 DIAGNOSIS — R109 Unspecified abdominal pain: Secondary | ICD-10-CM | POA: Diagnosis present

## 2021-11-25 LAB — URINALYSIS, ROUTINE W REFLEX MICROSCOPIC
Bilirubin Urine: NEGATIVE
Glucose, UA: NEGATIVE mg/dL
Ketones, ur: NEGATIVE mg/dL
Leukocytes,Ua: NEGATIVE
Nitrite: NEGATIVE
Protein, ur: NEGATIVE mg/dL
RBC / HPF: >50 RBC/hpf — ABNORMAL HIGH (ref 0–5)
Specific Gravity, Urine: 1.011 (ref 1.005–1.030)
pH: 7 (ref 5.0–8.0)

## 2021-11-25 LAB — CBC
HCT: 47.4 % — ABNORMAL HIGH (ref 36.0–46.0)
Hemoglobin: 15.4 g/dL — ABNORMAL HIGH (ref 12.0–15.0)
MCH: 30.1 pg (ref 26.0–34.0)
MCHC: 32.5 g/dL (ref 30.0–36.0)
MCV: 92.6 fL (ref 80.0–100.0)
Platelets: 232 10*3/uL (ref 150–400)
RBC: 5.12 MIL/uL — ABNORMAL HIGH (ref 3.87–5.11)
RDW: 13 % (ref 11.5–15.5)
WBC: 7.3 10*3/uL (ref 4.0–10.5)
nRBC: 0 % (ref 0.0–0.2)

## 2021-11-25 LAB — BASIC METABOLIC PANEL
Anion gap: 10 (ref 5–15)
BUN: 11 mg/dL (ref 6–20)
CO2: 27 mmol/L (ref 22–32)
Calcium: 9.7 mg/dL (ref 8.9–10.3)
Chloride: 100 mmol/L (ref 98–111)
Creatinine, Ser: 0.84 mg/dL (ref 0.44–1.00)
GFR, Estimated: >60 mL/min (ref >60)
Glucose, Bld: 148 mg/dL — ABNORMAL HIGH (ref 70–99)
Potassium: 3.9 mmol/L (ref 3.5–5.1)
Sodium: 137 mmol/L (ref 135–145)

## 2021-11-25 MED ORDER — ONDANSETRON 4 MG PO TBDP: 4.0000 mg | ORAL_TABLET | Freq: Three times a day (TID) | ORAL | 0 refills | Status: AC | PRN

## 2021-11-25 MED ORDER — ONDANSETRON 4 MG PO TBDP: 4.0000 mg | ORAL_TABLET | Freq: Three times a day (TID) | ORAL | 0 refills | Status: DC | PRN

## 2021-11-25 MED ORDER — ONDANSETRON HCL 4 MG/2ML IJ SOLN
4.0000 mg | Freq: Once | INTRAMUSCULAR | Status: AC
  Administered 2021-11-25: 4 mg via INTRAVENOUS
  Filled 2021-11-25: qty 2

## 2021-11-25 MED ORDER — OXYCODONE-ACETAMINOPHEN 5-325 MG PO TABS
1.0000 | ORAL_TABLET | Freq: Three times a day (TID) | ORAL | 0 refills | Status: AC | PRN
Start: 2021-11-25 — End: 2022-11-25

## 2021-11-25 MED ORDER — ACETAMINOPHEN 325 MG PO TABS
650.0000 mg | ORAL_TABLET | Freq: Once | ORAL | Status: DC
  Filled 2021-11-25: qty 2

## 2021-11-25 MED ORDER — MORPHINE SULFATE (PF) 4 MG/ML IV SOLN
4.0000 mg | Freq: Once | INTRAVENOUS | Status: AC
  Administered 2021-11-25: 4 mg via INTRAVENOUS
  Filled 2021-11-25: qty 1

## 2021-11-25 NOTE — ED Triage Notes (Signed)
Pt via POV from home. Pt c/o L sides flank pain and vomiting. Also states that she has been having some blood in her urine the past 2 days. Pt has a hx of polycystic kidney disease. Pt is A&Ox4 and NAD

## 2021-11-25 NOTE — ED Notes (Signed)
Gave patient urine cup.

## 2021-11-25 NOTE — ED Provider Notes (Signed)
[facility] Provider Note    Event Date/Time   First MD Initiated Contact with Patient 11/25/21 1152     (approximate)   History   Flank Pain   HPI  Teresa Davenport is a 56 y.o. female with a history of ureterolithiasis/kidney stones, renal cyst who presents with complaints of left flank pain.  Patient reports she is been having intermittent left flank pain for over a week.  She reports he was seen at [facility] and had an ultrasound which was reassuring.  She denies fevers or chills.  No dysuria.  She reports she needs relief from the pain     Physical Exam   Triage Vital Signs: ED Triage Vitals  Enc Vitals Group     BP 11/25/21 1147 (!) 146/73     Pulse Rate 11/25/21 1147 96     Resp 11/25/21 1147 17     Temp 11/25/21 1147 97.8 F (36.6 C)     Temp Source 11/25/21 1147 Oral     SpO2 11/25/21 1147 98 %     Weight 11/25/21 1144 68 kg (150 lb)     Height 11/25/21 1144 1.575 m (5\' 2" )     Head Circumference --      Peak Flow --      Pain Score 11/25/21 1144 9     Pain Loc --      Pain Edu? --      Excl. in GC? --     Most recent vital signs: Vitals:   11/25/21 1147 11/25/21 1153  BP: (!) 146/73 (!) 146/73  Pulse: 96 92  Resp: 17 19  Temp: 97.8 F (36.6 C) 97.8 F (36.6 C)  SpO2: 98% 98%     General: Awake, no distress.  CV:  Good peripheral perfusion.  Resp:  Normal effort.  Abd:  No distention.  No CVA tenderness Other:     ED Results / Procedures / Treatments   Labs (all labs ordered are listed, but only abnormal results are displayed) Labs Reviewed  URINALYSIS, ROUTINE W REFLEX MICROSCOPIC - Abnormal; Notable for the following components:      Result Value   Color, Urine YELLOW (*)    APPearance HAZY (*)    Hgb urine dipstick LARGE (*)    RBC / HPF >50 (*)    Bacteria, UA RARE (*)    All other components within normal limits  BASIC METABOLIC PANEL - Abnormal; Notable for the following components:    Glucose, Bld 148 (*)    All other components within normal limits  CBC - Abnormal; Notable for the following components:   RBC 5.12 (*)    Hemoglobin 15.4 (*)    HCT 47.4 (*)    All other components within normal limits     EKG     RADIOLOGY Reviewed CT scan reads from [facility] January 8 which did demonstrate bilateral nephrolithiasis, had recent ultrasound at [facility] which was unremarkable    PROCEDURES:  Critical Care performed:   Procedures   MEDICATIONS ORDERED IN ED: Medications  morphine (PF) 4 MG/ML injection 4 mg (has no administration in time range)  ondansetron (ZOFRAN) injection 4 mg (has no administration in time range)  morphine (PF) 4 MG/ML injection 4 mg (4 mg Intravenous Given 11/25/21 1301)  ondansetron (ZOFRAN) injection 4 mg (4 mg Intravenous Given 11/25/21 1259)     IMPRESSION / MDM / ASSESSMENT AND PLAN / ED COURSE  I reviewed the  triage vital signs and the nursing notes. Patient's presentation is most consistent with acute illness / injury with system symptoms.   Patient presents with left-sided flank pain as detailed above, given her history strongly suspicious for ureterolithiasis, less likely UTI.  Afebrile, heart rate is reassuring.  Patient has extensive medication allergies, will give IV morphine, IV Zofran for pain relief, pending urinalysis  Lab work reviewed and is overall reassuring  Urinalysis without evidence of infection, hemoglobin noted.  Suspect ureterolithiasis given her history  No CVA tenderness to suggest hydronephrosis  Pain improved after 2 doses of IV morphine, will provide short course of home analgesia, outpatient follow-up with urology        FINAL CLINICAL IMPRESSION(S) / ED DIAGNOSES   Final diagnoses:  Kidney stone     Rx / DC Orders   ED Discharge Orders          Ordered    oxyCODONE-acetaminophen (PERCOCET) 5-325 MG tablet  Every 8 hours PRN        11/25/21 1408             Note:  This  document was prepared using Dragon voice recognition software and may include unintentional dictation errors.   Jene Every, MD 11/25/21 1409
